# Patient Record
Sex: Female | Born: 1990
Health system: Southern US, Community
[De-identification: ages and names within clinical notes are randomized; demographics above are authoritative.]

## PROBLEM LIST (undated history)

## (undated) ENCOUNTER — Inpatient Hospital Stay (HOSPITAL_COMMUNITY): Payer: Self-pay

## (undated) DIAGNOSIS — D219 Benign neoplasm of connective and other soft tissue, unspecified: Secondary | ICD-10-CM

## (undated) DIAGNOSIS — O039 Complete or unspecified spontaneous abortion without complication: Secondary | ICD-10-CM

## (undated) DIAGNOSIS — T7840XA Allergy, unspecified, initial encounter: Secondary | ICD-10-CM

## (undated) DIAGNOSIS — F419 Anxiety disorder, unspecified: Secondary | ICD-10-CM

## (undated) DIAGNOSIS — Z91018 Allergy to other foods: Secondary | ICD-10-CM

## (undated) DIAGNOSIS — M549 Dorsalgia, unspecified: Secondary | ICD-10-CM

## (undated) DIAGNOSIS — IMO0001 Reserved for inherently not codable concepts without codable children: Secondary | ICD-10-CM

## (undated) DIAGNOSIS — F32A Depression, unspecified: Secondary | ICD-10-CM

## (undated) DIAGNOSIS — R0602 Shortness of breath: Secondary | ICD-10-CM

## (undated) HISTORY — DX: Shortness of breath: R06.02

## (undated) HISTORY — DX: Depression, unspecified: F32.A

## (undated) HISTORY — DX: Dorsalgia, unspecified: M54.9

## (undated) HISTORY — DX: Anxiety disorder, unspecified: F41.9

## (undated) HISTORY — DX: Allergy, unspecified, initial encounter: T78.40XA

## (undated) HISTORY — DX: Allergy to other foods: Z91.018

## (undated) HISTORY — PX: WISDOM TOOTH EXTRACTION: SHX21

---
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
## 2012-03-13 ENCOUNTER — Ambulatory Visit: Payer: 59

## 2012-03-13 ENCOUNTER — Ambulatory Visit: Payer: BC Managed Care – PPO | Admitting: Family Medicine

## 2012-03-13 VITALS — BP 109/73 | HR 91 | Temp 98.3°F | Resp 18 | Ht 64.0 in | Wt 168.0 lb

## 2012-03-13 DIAGNOSIS — L03119 Cellulitis of unspecified part of limb: Secondary | ICD-10-CM

## 2012-03-13 DIAGNOSIS — M25549 Pain in joints of unspecified hand: Secondary | ICD-10-CM

## 2012-03-13 DIAGNOSIS — M25542 Pain in joints of left hand: Secondary | ICD-10-CM

## 2012-03-13 DIAGNOSIS — L02519 Cutaneous abscess of unspecified hand: Secondary | ICD-10-CM

## 2012-03-13 LAB — POCT CBC
Granulocyte percent: 63.1 %G (ref 37–80)
HCT, POC: 46.6 % (ref 37.7–47.9)
Hemoglobin: 14.4 g/dL (ref 12.2–16.2)
Lymph, poc: 4.2 — AB (ref 0.6–3.4)
MCH, POC: 28 pg (ref 27–31.2)
MCHC: 30.9 g/dL — AB (ref 31.8–35.4)
MCV: 90.6 fL (ref 80–97)
MID (cbc): 0.8 (ref 0–0.9)
MPV: 9.3 fL (ref 0–99.8)
POC Granulocyte: 8.6 — AB (ref 2–6.9)
POC LYMPH PERCENT: 31 %L (ref 10–50)
POC MID %: 5.9 %M (ref 0–12)
Platelet Count, POC: 329 10*3/uL (ref 142–424)
RBC: 5.14 M/uL (ref 4.04–5.48)
RDW, POC: 13.4 %
WBC: 13.7 10*3/uL — AB (ref 4.6–10.2)

## 2012-03-13 NOTE — Progress Notes (Signed)
Subjective:    Patient ID: Deborah Gibbs, female    DOB: 1990/08/04, 21 y.o.   MRN: [id number]  HPI   Deborah Gibbs is a 21 yr old female who was stung by a yellow jacket 4 days ago.  Saw the bee as it stung her left hand.  Removed any remaining stinger.  Immediately had redness, swelling, and itching.  Began taking Claritin with little relief.  States the redness is spreading.  Now having pain moving her thumb.  Pain in the wrist now as well.  No fever or chills.  Allergies to PCN and benadryl.  States tetanus is up to date.     Review of Systems  Constitutional: Negative for fever and chills.  HENT: Negative.   Respiratory: Negative.   Cardiovascular: Negative.   Gastrointestinal: Negative.   Musculoskeletal: Positive for arthralgias (right thumb, wrist).  Skin: Positive for color change.  Neurological: Negative.        Objective:   Physical Exam  Vitals reviewed. Constitutional: She is oriented to person, place, and time. She appears well-developed and well-nourished. No distress.  HENT:  Head: Normocephalic and atraumatic.  Musculoskeletal:       Right wrist: Normal.       Left hand: She exhibits decreased range of motion (pain on ROM left thumb) and tenderness (exquisitely tender over the thumb and flexor aspect of the wrist and forearm).       Hands: Lymphadenopathy:       Left axillary: No pectoral and no lateral adenopathy present.      Left: No epitrochlear adenopathy present.  Neurological: She is alert and oriented to person, place, and time.  Skin: Skin is warm and dry.  Psychiatric: She has a normal mood and affect. Her behavior is normal.     Filed Vitals:   03/13/12 0940  BP: 109/73  Pulse: 91  Temp: 98.3 F (36.8 C)  Resp: 18     Results for orders placed in visit on 03/13/12  POCT CBC      Component Value Range   WBC 13.7 (*) 4.6 - 10.2 K/uL   Lymph, poc 4.2 (*) 0.6 - 3.4   POC LYMPH PERCENT 31.0  10 - 50 %L   MID (cbc) 0.8  0 - 0.9   POC MID % 5.9  0  - 12 %M   POC Granulocyte 8.6 (*) 2 - 6.9   Granulocyte percent 63.1  37 - 80 %G   RBC 5.14  4.04 - 5.48 M/uL   Hemoglobin 14.4  12.2 - 16.2 g/dL   HCT, POC 78.2  95.6 - 47.9 %   MCV 90.6  80 - 97 fL   MCH, POC 28.0  27 - 31.2 pg   MCHC 30.9 (*) 31.8 - 35.4 g/dL   RDW, POC 21.3     Platelet Count, POC 329  142 - 424 K/uL   MPV 9.3  0 - 99.8 fL     [facility] reading (PRIMARY) by  Dr. Katrinka Blazing - soft tissue swelling, otherwise normal      Assessment & Plan:   1. Cellulitis of hand  POCT CBC    Deborah Gibbs is a 21 yr old female s/p bee sting 4 days ago now with cellulitis of the thumb and wrist.  She is exquisitely tender on exam, out of proportion to physical exam findings.  Concern for involvement of tendon or deep tissue.  White count is 13.7.  Due to pt allergies to PCN  and benadryl, we are hesitant to give rocephin in the office.   Pt has been NPO today.  X-ray per hand surgery request is normal aside from soft tissue swelling.  Spoke with PA at [facility], pt will be seen there this afternoon for further evaluation.

## 2012-03-13 NOTE — Progress Notes (Signed)
Below history, physical exam reviewed in detail.  Xrays reviewed.  Patient examined by myself and agree with below physical findings.  Agree with assessment and plan; recommend hand surgery management due to significant pain with range of motion of wrist and forearm in addition to hand pain.  KMS

## 2012-03-14 ENCOUNTER — Telehealth: Payer: Self-pay | Admitting: Radiology

## 2012-03-14 NOTE — Telephone Encounter (Signed)
Patient was seen at [facility] yesterday and is being seen today also. FYI only

## 2012-03-17 NOTE — Progress Notes (Signed)
Reviewed and agree.

## 2012-08-13 ENCOUNTER — Emergency Department (HOSPITAL_COMMUNITY)
Admission: EM | Admit: 2012-08-13 | Discharge: 2012-08-13 | Disposition: A | Discharge status: Home / Self Care | Payer: 59 | Attending: Emergency Medicine | Admitting: Emergency Medicine

## 2012-08-13 ENCOUNTER — Encounter (HOSPITAL_COMMUNITY): Payer: Self-pay | Admitting: Emergency Medicine

## 2012-08-13 ENCOUNTER — Emergency Department (HOSPITAL_COMMUNITY): Payer: 59

## 2012-08-13 DIAGNOSIS — Y9389 Activity, other specified: Secondary | ICD-10-CM | POA: Insufficient documentation

## 2012-08-13 DIAGNOSIS — S99919A Unspecified injury of unspecified ankle, initial encounter: Secondary | ICD-10-CM | POA: Insufficient documentation

## 2012-08-13 DIAGNOSIS — M25561 Pain in right knee: Secondary | ICD-10-CM

## 2012-08-13 DIAGNOSIS — Z880 Allergy status to penicillin: Secondary | ICD-10-CM | POA: Insufficient documentation

## 2012-08-13 DIAGNOSIS — S8990XA Unspecified injury of unspecified lower leg, initial encounter: Secondary | ICD-10-CM | POA: Insufficient documentation

## 2012-08-13 DIAGNOSIS — Y92410 Unspecified street and highway as the place of occurrence of the external cause: Secondary | ICD-10-CM | POA: Insufficient documentation

## 2012-08-13 DIAGNOSIS — R109 Unspecified abdominal pain: Secondary | ICD-10-CM | POA: Insufficient documentation

## 2012-08-13 LAB — POCT I-STAT, CHEM 8
BUN: 4 mg/dL — ABNORMAL LOW (ref 6–23)
Calcium, Ion: 1.25 mmol/L — ABNORMAL HIGH (ref 1.12–1.23)
Chloride: 107 mEq/L (ref 96–112)
Creatinine, Ser: 0.7 mg/dL (ref 0.50–1.10)
Glucose, Bld: 85 mg/dL (ref 70–99)
HCT: 40 % (ref 36.0–46.0)
Hemoglobin: 13.6 g/dL (ref 12.0–15.0)
Potassium: 3.4 mEq/L — ABNORMAL LOW (ref 3.5–5.1)
Sodium: 142 mEq/L (ref 135–145)
TCO2: 23 mmol/L (ref 0–100)

## 2012-08-13 MED ORDER — HYDROMORPHONE HCL PF 1 MG/ML IJ SOLN
1.0000 mg | Freq: Once | INTRAMUSCULAR | Status: AC
  Administered 2012-08-13: 1 mg via INTRAVENOUS
  Filled 2012-08-13: qty 1

## 2012-08-13 MED ORDER — IOHEXOL 300 MG/ML  SOLN
100.0000 mL | Freq: Once | INTRAMUSCULAR | Status: AC | PRN
  Administered 2012-08-13: 100 mL via INTRAVENOUS

## 2012-08-13 MED ORDER — HYDROCODONE-ACETAMINOPHEN 5-325 MG PO TABS: 2.0000 | ORAL_TABLET | Freq: Four times a day (QID) | ORAL | Status: DC | PRN
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
## 2012-08-13 NOTE — ED Provider Notes (Signed)
  Medical screening examination/treatment/procedure(s) were conducted as a shared visit with non-physician practitioner(s) and myself.  I personally evaluated the patient during the encounter  Please see my separate respective documentation pertaining to this patient encounter   Vida Roller, MD 08/13/12 [phone]
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
## 2013-07-03 ENCOUNTER — Emergency Department (HOSPITAL_COMMUNITY)
Admission: EM | Admit: 2013-07-03 | Discharge: 2013-07-03 | Disposition: A | Discharge status: Home / Self Care | Payer: 59 | Attending: Emergency Medicine | Admitting: Emergency Medicine

## 2013-07-03 ENCOUNTER — Encounter (HOSPITAL_COMMUNITY): Payer: Self-pay | Admitting: Emergency Medicine

## 2013-07-03 ENCOUNTER — Emergency Department (HOSPITAL_COMMUNITY): Payer: 59

## 2013-07-03 DIAGNOSIS — R102 Pelvic and perineal pain: Secondary | ICD-10-CM

## 2013-07-03 DIAGNOSIS — N946 Dysmenorrhea, unspecified: Secondary | ICD-10-CM | POA: Insufficient documentation

## 2013-07-03 DIAGNOSIS — Z3202 Encounter for pregnancy test, result negative: Secondary | ICD-10-CM | POA: Insufficient documentation

## 2013-07-03 DIAGNOSIS — Z880 Allergy status to penicillin: Secondary | ICD-10-CM | POA: Insufficient documentation

## 2013-07-03 DIAGNOSIS — N939 Abnormal uterine and vaginal bleeding, unspecified: Secondary | ICD-10-CM

## 2013-07-03 HISTORY — DX: Reserved for inherently not codable concepts without codable children: IMO0001

## 2013-07-03 HISTORY — DX: Complete or unspecified spontaneous abortion without complication: O03.9

## 2013-07-03 LAB — CBC
HCT: 35.9 % — ABNORMAL LOW (ref 36.0–46.0)
Hemoglobin: 12.6 g/dL (ref 12.0–15.0)
MCH: 29.1 pg (ref 26.0–34.0)
MCHC: 35.1 g/dL (ref 30.0–36.0)
MCV: 82.9 fL (ref 78.0–100.0)
Platelets: 268 10*3/uL (ref 150–400)
RBC: 4.33 MIL/uL (ref 3.87–5.11)
RDW: 12.5 % (ref 11.5–15.5)
WBC: 9.3 10*3/uL (ref 4.0–10.5)

## 2013-07-03 LAB — URINALYSIS, ROUTINE W REFLEX MICROSCOPIC
Bilirubin Urine: NEGATIVE
Glucose, UA: NEGATIVE mg/dL
Ketones, ur: NEGATIVE mg/dL
Nitrite: NEGATIVE
Protein, ur: 30 mg/dL — AB
Specific Gravity, Urine: 1.015 (ref 1.005–1.030)
Urobilinogen, UA: 0.2 mg/dL (ref 0.0–1.0)
pH: 6.5 (ref 5.0–8.0)

## 2013-07-03 LAB — URINE MICROSCOPIC-ADD ON

## 2013-07-03 LAB — COMPREHENSIVE METABOLIC PANEL
ALT: 7 U/L (ref 0–35)
AST: 16 U/L (ref 0–37)
Albumin: 3.9 g/dL (ref 3.5–5.2)
Alkaline Phosphatase: 44 U/L (ref 39–117)
BUN: 7 mg/dL (ref 6–23)
CO2: 20 mEq/L (ref 19–32)
Calcium: 9.3 mg/dL (ref 8.4–10.5)
Chloride: 107 mEq/L (ref 96–112)
Creatinine, Ser: 0.61 mg/dL (ref 0.50–1.10)
GFR calc Af Amer: >90 mL/min (ref >90)
GFR calc non Af Amer: >90 mL/min (ref >90)
Glucose, Bld: 94 mg/dL (ref 70–99)
Potassium: 3.5 mEq/L — ABNORMAL LOW (ref 3.7–5.3)
Sodium: 143 mEq/L (ref 137–147)
Total Bilirubin: 0.4 mg/dL (ref 0.3–1.2)
Total Protein: 6.9 g/dL (ref 6.0–8.3)

## 2013-07-03 LAB — ABO/RH: ABO/RH(D): O NEG

## 2013-07-03 LAB — PREGNANCY, URINE: Preg Test, Ur: NEGATIVE

## 2013-07-03 LAB — HCG, QUANTITATIVE, PREGNANCY: hCG, Beta Chain, Quant, S: <1 m[IU]/mL (ref <5)

## 2013-07-03 MED ORDER — TRAMADOL HCL 50 MG PO TABS: 50.0000 mg | ORAL_TABLET | Freq: Four times a day (QID) | ORAL | Status: DC | PRN

## 2013-07-03 MED ORDER — HYDROMORPHONE HCL PF 1 MG/ML IJ SOLN
1.0000 mg | Freq: Once | INTRAMUSCULAR | Status: AC
  Administered 2013-07-03: 1 mg via INTRAVENOUS
  Filled 2013-07-03: qty 1

## 2013-07-03 MED ORDER — ONDANSETRON HCL 4 MG/2ML IJ SOLN
4.0000 mg | Freq: Once | INTRAMUSCULAR | Status: AC
  Administered 2013-07-03: 4 mg via INTRAVENOUS
  Filled 2013-07-03: qty 2

## 2013-07-03 MED ORDER — SODIUM CHLORIDE 0.9 % IV BOLUS (SEPSIS)
1000.0000 mL | Freq: Once | INTRAVENOUS | Status: AC
  Administered 2013-07-03: 1000 mL via INTRAVENOUS

## 2013-07-03 NOTE — ED Provider Notes (Signed)
CSN: [id number]     Arrival date & time 07/03/13  0865 History   First MD Initiated Contact with Patient 07/03/13 0830     Chief Complaint  Patient presents with  . Abdominal Pain     (Consider location/radiation/quality/duration/timing/severity/associated sxs/prior Treatment) Patient is a 23 y.o. female presenting with abdominal pain. The history is provided by the patient.  Abdominal Pain Associated symptoms: vaginal bleeding   Associated symptoms: no chest pain, no chills, no diarrhea, no dysuria, no fever, no shortness of breath, no sore throat, no vaginal discharge and no vomiting   pt c/o vaginal bleeding and bilateral, lower abdominal/pelvic cramping for the past day. Symptoms gradual onset. Yesterday was just mild spotting and cramping. This morning pain and bleeding worse. States had used 2 tampons so far today. Also states pain more intense. Pain dull, cramping, non radiating. No worse on one side or the other. Pt states at first states thought was just menstrual cramping, but worse. Pt initially states took pregnancy test at home, pt is very equivocal about when she took it and results - states possibly a month ago, but then states isnt sure if was positive or not.  Previously g1ab1.  States has been on 'shots' for birth control and states unsure when last had, also does not know when last period was. Denies faintness or dizziness. No fever or chills. No dysuria or urgency. No prior abd surgery. No distension. Having normal stools. No nv.     Past Medical History  Diagnosis Date  . Allergy   . Abortion    History reviewed. No pertinent past surgical history. Family History  Problem Relation Age of Onset  . Hypertension Father    History  Substance Use Topics  . Smoking status: Never Smoker   . Smokeless tobacco: Not on file  . Alcohol Use: No   OB History   Grav Para Term Preterm Abortions TAB SAB Ect Mult Living                 Review of Systems  Constitutional:  Negative for fever and chills.  HENT: Negative for sore throat.   Eyes: Negative for pain.  Respiratory: Negative for shortness of breath.   Cardiovascular: Negative for chest pain.  Gastrointestinal: Positive for abdominal pain. Negative for vomiting and diarrhea.  Endocrine: Negative for polyuria.  Genitourinary: Positive for vaginal bleeding. Negative for dysuria, flank pain and vaginal discharge.  Musculoskeletal: Negative for back pain and neck pain.  Skin: Negative for rash.  Neurological: Negative for headaches.  Hematological: Does not bruise/bleed easily.  Psychiatric/Behavioral: Negative for confusion.      Allergies  Diphenhydramine hcl and Penicillins  Home Medications   Current Outpatient Rx  Name  Route  Sig  Dispense  Refill  . HYDROcodone-acetaminophen (NORCO/VICODIN) 5-325 MG per tablet   Oral   Take 2 tablets by mouth every 6 (six) hours as needed for pain.   15 tablet   0    BP 123/78  Temp(Src) 98.5 F (36.9 C) (Oral)  Resp 20  Ht 5\' 3"  (1.6 m)  Wt 160 lb (72.576 kg)  BMI 28.35 kg/m2  SpO2 100% Physical Exam  Nursing note and vitals reviewed. Constitutional: She is oriented to person, place, and time. She appears well-developed and well-nourished. No distress.  HENT:  Mouth/Throat: Oropharynx is clear and moist.  Eyes: Conjunctivae are normal. No scleral icterus.  Neck: Neck supple. No tracheal deviation present.  Cardiovascular: Normal rate, regular rhythm, normal heart sounds  and intact distal pulses.   Pulmonary/Chest: Effort normal and breath sounds normal. No respiratory distress.  Abdominal: Soft. Normal appearance and bowel sounds are normal. She exhibits no distension and no mass. There is tenderness. There is no rebound and no guarding.  Lower abd/pelvic tenderness, no rebound or guarding. No hernia.   Genitourinary:  No cva tenderness.   Musculoskeletal: She exhibits no edema and no tenderness.  Neurological: She is alert and oriented  to person, place, and time.  Skin: Skin is warm and dry. No rash noted. She is not diaphoretic.  Psychiatric: She has a normal mood and affect.    ED Course  Procedures (including critical care time)   Results for orders placed during the hospital encounter of 07/03/13  PREGNANCY, URINE      Result Value Ref Range   Preg Test, Ur NEGATIVE  NEGATIVE  CBC      Result Value Ref Range   WBC 9.3  4.0 - 10.5 K/uL   RBC 4.33  3.87 - 5.11 MIL/uL   Hemoglobin 12.6  12.0 - 15.0 g/dL   HCT 35.9 (*) 36.0 - 46.0 %   MCV 82.9  78.0 - 100.0 fL   MCH 29.1  26.0 - 34.0 pg   MCHC 35.1  30.0 - 36.0 g/dL   RDW 12.5  11.5 - 15.5 %   Platelets 268  150 - 400 K/uL  COMPREHENSIVE METABOLIC PANEL      Result Value Ref Range   Sodium 143  137 - 147 mEq/L   Potassium 3.5 (*) 3.7 - 5.3 mEq/L   Chloride 107  96 - 112 mEq/L   CO2 20  19 - 32 mEq/L   Glucose, Bld 94  70 - 99 mg/dL   BUN 7  6 - 23 mg/dL   Creatinine, Ser 0.61  0.50 - 1.10 mg/dL   Calcium 9.3  8.4 - 10.5 mg/dL   Total Protein 6.9  6.0 - 8.3 g/dL   Albumin 3.9  3.5 - 5.2 g/dL   AST 16  0 - 37 U/L   ALT 7  0 - 35 U/L   Alkaline Phosphatase 44  39 - 117 U/L   Total Bilirubin 0.4  0.3 - 1.2 mg/dL   GFR calc non Af Amer >90  >90 mL/min   GFR calc Af Amer >90  >90 mL/min  HCG, QUANTITATIVE, PREGNANCY      Result Value Ref Range   hCG, Beta Chain, Quant, S <1  <5 mIU/mL  URINALYSIS, ROUTINE W REFLEX MICROSCOPIC      Result Value Ref Range   Color, Urine RED (*) YELLOW   APPearance CLOUDY (*) CLEAR   Specific Gravity, Urine 1.015  1.005 - 1.030   pH 6.5  5.0 - 8.0   Glucose, UA NEGATIVE  NEGATIVE mg/dL   Hgb urine dipstick LARGE (*) NEGATIVE   Bilirubin Urine NEGATIVE  NEGATIVE   Ketones, ur NEGATIVE  NEGATIVE mg/dL   Protein, ur 30 (*) NEGATIVE mg/dL   Urobilinogen, UA 0.2  0.0 - 1.0 mg/dL   Nitrite NEGATIVE  NEGATIVE   Leukocytes, UA TRACE (*) NEGATIVE  URINE MICROSCOPIC-ADD ON      Result Value Ref Range   Squamous Epithelial  / LPF FEW (*) RARE   WBC, UA 0-2  <3 WBC/hpf   RBC / HPF TOO NUMEROUS TO COUNT  <3 RBC/hpf   Bacteria, UA FEW (*) RARE  ABO/RH      Result Value Ref Range  ABO/RH(D) O NEG     US Transvaginal Non-ob  07/03/2013   CLINICAL DATA:  Pelvic pain  EXAM: TRANSABDOMINAL AND TRANSVAGINAL ULTRASOUND OF PELVIS  DOPPLER ULTRASOUND OF OVARIES  TECHNIQUE: Both transabdominal and transvaginal ultrasound examinations of the pelvis were performed. Transabdominal technique was performed for global imaging of the pelvis including uterus, ovaries, adnexal regions, and pelvic cul-de-sac.  It was necessary to proceed with endovaginal exam following the transabdominal exam to visualize the ovaries and endometrium. Color and duplex Doppler ultrasound was utilized to evaluate blood flow to the ovaries.  COMPARISON:  None.  FINDINGS: Uterus  Measurements: 5.5 x 4 x 4.8 cm. No fibroids or other mass visualized.  Endometrium  Thickness: 5.7 mm.  No focal abnormality visualized.  Right ovary  Measurements: 3.3 x 2.4 x 2.2 cm. There is a small right ovarian follicle. Normal appearance/no adnexal mass.  Left ovary  Measurements: 3.3 x 1 x 0.9 cm. Normal appearance/no adnexal mass.  Pulsed Doppler evaluation of both ovaries demonstrates normal low-resistance arterial and venous waveforms.  Other findings  There is a trace amount of pelvic free fluid likely physiologic.  IMPRESSION: 1. Normal pelvic ultrasound. 2. No ovarian torsion.   Electronically Signed   By: Kathreen Devoid   On: 07/03/2013 10:54   US Pelvis Complete  07/03/2013   CLINICAL DATA:  Pelvic pain  EXAM: TRANSABDOMINAL AND TRANSVAGINAL ULTRASOUND OF PELVIS  DOPPLER ULTRASOUND OF OVARIES  TECHNIQUE: Both transabdominal and transvaginal ultrasound examinations of the pelvis were performed. Transabdominal technique was performed for global imaging of the pelvis including uterus, ovaries, adnexal regions, and pelvic cul-de-sac.  It was necessary to proceed with endovaginal  exam following the transabdominal exam to visualize the ovaries and endometrium. Color and duplex Doppler ultrasound was utilized to evaluate blood flow to the ovaries.  COMPARISON:  None.  FINDINGS: Uterus  Measurements: 5.5 x 4 x 4.8 cm. No fibroids or other mass visualized.  Endometrium  Thickness: 5.7 mm.  No focal abnormality visualized.  Right ovary  Measurements: 3.3 x 2.4 x 2.2 cm. There is a small right ovarian follicle. Normal appearance/no adnexal mass.  Left ovary  Measurements: 3.3 x 1 x 0.9 cm. Normal appearance/no adnexal mass.  Pulsed Doppler evaluation of both ovaries demonstrates normal low-resistance arterial and venous waveforms.  Other findings  There is a trace amount of pelvic free fluid likely physiologic.  IMPRESSION: 1. Normal pelvic ultrasound. 2. No ovarian torsion.   Electronically Signed   By: Kathreen Devoid   On: 07/03/2013 10:54   [place] Art/ven Flow Abd Pelv Doppler  07/03/2013   CLINICAL DATA:  Pelvic pain  EXAM: TRANSABDOMINAL AND TRANSVAGINAL ULTRASOUND OF PELVIS  DOPPLER ULTRASOUND OF OVARIES  TECHNIQUE: Both transabdominal and transvaginal ultrasound examinations of the pelvis were performed. Transabdominal technique was performed for global imaging of the pelvis including uterus, ovaries, adnexal regions, and pelvic cul-de-sac.  It was necessary to proceed with endovaginal exam following the transabdominal exam to visualize the ovaries and endometrium. Color and duplex Doppler ultrasound was utilized to evaluate blood flow to the ovaries.  COMPARISON:  None.  FINDINGS: Uterus  Measurements: 5.5 x 4 x 4.8 cm. No fibroids or other mass visualized.  Endometrium  Thickness: 5.7 mm.  No focal abnormality visualized.  Right ovary  Measurements: 3.3 x 2.4 x 2.2 cm. There is a small right ovarian follicle. Normal appearance/no adnexal mass.  Left ovary  Measurements: 3.3 x 1 x 0.9 cm. Normal appearance/no adnexal mass.  Pulsed Doppler  evaluation of both ovaries demonstrates normal  low-resistance arterial and venous waveforms.  Other findings  There is a trace amount of pelvic free fluid likely physiologic.  IMPRESSION: 1. Normal pelvic ultrasound. 2. No ovarian torsion.   Electronically Signed   By: Kathreen Devoid   On: 07/03/2013 10:54      MDM  Iv ns. Labs. Dilaudid 1 mg iv. zofran iv.  Reviewed nursing notes and prior charts for additional history.   preg test neg.   U/s neg.  On pelvic exam, scant amt dark blood in vagina, to brisk or active bleeding. Cervix closed. No focal adnexal mass or tenderness. No cmt.   Recheck pt, comfortable. Drinking fluids. abd soft nt.  Pt appears stable for d/c.      Mirna Mires, MD 07/03/13 [phone]

## 2013-07-03 NOTE — ED Notes (Signed)
MD at bedside for pelvic exam with NT.

## 2013-07-03 NOTE — ED Notes (Signed)
Pt states she started having terrible cramps last night. This morning pt thought her period was coming on- started passing large "clumps and clots" and having cramps. Pt feels lightheaded and dizzy.

## 2013-07-03 NOTE — ED Notes (Signed)
Patient transported to Ultrasound 

## 2013-07-03 NOTE — Discharge Instructions (Signed)
Rest. Drink plenty of fluids. Take motrin or aleve as need for pain. You may also take ultram as need for pain - no driving for the next 6 hours of if taking ultram. Follow up with primary care doctor in the next few days if symptoms fail to improve/resolve. Return to ER right away if worse, new symptoms, fevers, worsening pain, persistent vomiting, other concern.      Abdominal Pain, Women Abdominal (stomach, pelvic, or belly) pain can be caused by many things. It is important to tell your doctor:  The location of the pain.  Does it come and go or is it present all the time?  Are there things that start the pain (eating certain foods, exercise)?  Are there other symptoms associated with the pain (fever, nausea, vomiting, diarrhea)? All of this is helpful to know when trying to find the cause of the pain. CAUSES   Stomach: virus or bacteria infection, or ulcer.  Intestine: appendicitis (inflamed appendix), regional ileitis (Crohn's disease), ulcerative colitis (inflamed colon), irritable bowel syndrome, diverticulitis (inflamed diverticulum of the colon), or cancer of the stomach or intestine.  Gallbladder disease or stones in the gallbladder.  Kidney disease, kidney stones, or infection.  Pancreas infection or cancer.  Fibromyalgia (pain disorder).  Diseases of the female organs:  Uterus: fibroid (non-cancerous) tumors or infection.  Fallopian tubes: infection or tubal pregnancy.  Ovary: cysts or tumors.  Pelvic adhesions (scar tissue).  Endometriosis (uterus lining tissue growing in the pelvis and on the pelvic organs).  Pelvic congestion syndrome (female organs filling up with blood just before the menstrual period).  Pain with the menstrual period.  Pain with ovulation (producing an egg).  Pain with an IUD (intrauterine device, birth control) in the uterus.  Cancer of the female organs.  Functional pain (pain not caused by a disease, may improve without  treatment).  Psychological pain.  Depression. DIAGNOSIS  Your doctor will decide the seriousness of your pain by doing an examination.  Blood tests.  X-rays.  Ultrasound.  CT scan (computed tomography, special type of X-ray).  MRI (magnetic resonance imaging).  Cultures, for infection.  Barium enema (dye inserted in the large intestine, to better view it with X-rays).  Colonoscopy (looking in intestine with a lighted tube).  Laparoscopy (minor surgery, looking in abdomen with a lighted tube).  Major abdominal exploratory surgery (looking in abdomen with a large incision). TREATMENT  The treatment will depend on the cause of the pain.   Many cases can be observed and treated at home.  Over-the-counter medicines recommended by your caregiver.  Prescription medicine.  Antibiotics, for infection.  Birth control pills, for painful periods or for ovulation pain.  Hormone treatment, for endometriosis.  Nerve blocking injections.  Physical therapy.  Antidepressants.  Counseling with a psychologist or psychiatrist.  Minor or major surgery. HOME CARE INSTRUCTIONS   Do not take laxatives, unless directed by your caregiver.  Take over-the-counter pain medicine only if ordered by your caregiver. Do not take aspirin because it can cause an upset stomach or bleeding.  Try a clear liquid diet (broth or water) as ordered by your caregiver. Slowly move to a bland diet, as tolerated, if the pain is related to the stomach or intestine.  Have a thermometer and take your temperature several times a day, and record it.  Bed rest and sleep, if it helps the pain.  Avoid sexual intercourse, if it causes pain.  Avoid stressful situations.  Keep your follow-up appointments and tests,  as your caregiver orders.  If the pain does not go away with medicine or surgery, you may try:  Acupuncture.  Relaxation exercises (yoga, meditation).  Group therapy.  Counseling. SEEK  MEDICAL CARE IF:   You notice certain foods cause stomach pain.  Your home care treatment is not helping your pain.  You need stronger pain medicine.  You want your IUD removed.  You feel faint or lightheaded.  You develop nausea and vomiting.  You develop a rash.  You are having side effects or an allergy to your medicine. SEEK IMMEDIATE MEDICAL CARE IF:   Your pain does not go away or gets worse.  You have a fever.  Your pain is felt only in portions of the abdomen. The right side could possibly be appendicitis. The left lower portion of the abdomen could be colitis or diverticulitis.  You are passing blood in your stools (bright red or black tarry stools, with or without vomiting).  You have blood in your urine.  You develop chills, with or without a fever.  You pass out. MAKE SURE YOU:   Understand these instructions.  Will watch your condition.  Will get help right away if you are not doing well or get worse. Document Released: 01/21/2007 Document Revised: 06/18/2011 Document Reviewed: 02/10/2009 [facility] Patient Information 2014 [place], [place].    Pelvic Pain, Female Female pelvic pain can be caused by many different things and start from a variety of places. Pelvic pain refers to pain that is located in the lower half of the abdomen and between your hips. The pain may occur over a short period of time (acute) or may be reoccurring (chronic). The cause of pelvic pain may be related to disorders affecting the female reproductive organs (gynecologic), but it may also be related to the bladder, kidney stones, an intestinal complication, or muscle or skeletal problems. Getting help right away for pelvic pain is important, especially if there has been severe, sharp, or a sudden onset of unusual pain. It is also important to get help right away because some types of pelvic pain can be life threatening.  CAUSES  Below are only some of the causes of pelvic pain. The  causes of pelvic pain can be in one of several categories.   Gynecologic.  Pelvic inflammatory disease.  Sexually transmitted infection.  Ovarian cyst or a twisted ovarian ligament (ovarian torsion).  Uterine lining that grows outside the uterus (endometriosis).  Fibroids, cysts, or tumors.  Ovulation.  Pregnancy.  Pregnancy that occurs outside the uterus (ectopic pregnancy).  Miscarriage.  Labor.  Abruption of the placenta or ruptured uterus.  Infection.  Uterine infection (endometritis).  Bladder infection.  Diverticulitis.  Miscarriage related to a uterine infection (septic abortion).  Bladder.  Inflammation of the bladder (cystitis).  Kidney stone(s).  Gastrointenstinal.  Constipation.  Diverticulitis.  Neurologic.  Trauma.  Feeling pelvic pain because of mental or emotional causes (psychosomatic).  Cancers of the bowel or pelvis. EVALUATION  Your caregiver will want to take a careful history of your concerns. This includes recent changes in your health, a careful gynecologic history of your periods (menses), and a sexual history. Obtaining your family history and medical history is also important. Your caregiver may suggest a pelvic exam. A pelvic exam will help identify the location and severity of the pain. It also helps in the evaluation of which organ system may be involved. In order to identify the cause of the pelvic pain and be properly treated, your caregiver  may order tests. These tests may include:   A pregnancy test.  Pelvic ultrasonography.  An X-ray exam of the abdomen.  A urinalysis or evaluation of vaginal discharge.  Blood tests. HOME CARE INSTRUCTIONS   Only take over-the-counter or prescription medicines for pain, discomfort, or fever as directed by your caregiver.   Rest as directed by your caregiver.   Eat a balanced diet.   Drink enough fluids to make your urine clear or pale yellow, or as directed.   Avoid  sexual intercourse if it causes pain.   Apply warm or cold compresses to the lower abdomen depending on which one helps the pain.   Avoid stressful situations.   Keep a journal of your pelvic pain. Write down when it started, where the pain is located, and if there are things that seem to be associated with the pain, such as food or your menstrual cycle.  Follow up with your caregiver as directed.  SEEK MEDICAL CARE IF:  Your medicine does not help your pain.  You have abnormal vaginal discharge. SEEK IMMEDIATE MEDICAL CARE IF:   You have heavy bleeding from the vagina.   Your pelvic pain increases.   You feel lightheaded or faint.   You have chills.   You have pain with urination or blood in your urine.   You have uncontrolled diarrhea or vomiting.   You have a fever or persistent symptoms for more than 3 days.  You have a fever and your symptoms suddenly get worse.   You are being physically or sexually abused.  MAKE SURE YOU:  Understand these instructions.  Will watch your condition.  Will get help if you are not doing well or get worse. Document Released: 02/21/2004 Document Revised: 09/25/2011 Document Reviewed: 07/16/2011 [facility] Patient Information 2014 [place], [place].    Dysmenorrhea Menstrual cramps (dysmenorrhea) are caused by the muscles of the uterus tightening (contracting) during a menstrual period. For some women, this discomfort is merely bothersome. For others, dysmenorrhea can be severe enough to interfere with everyday activities for a few days each month. Primary dysmenorrhea is menstrual cramps that last a couple of days when you start having menstrual periods or soon after. This often begins after a teenager starts having her period. As a woman gets older or has a baby, the cramps will usually lessen or disappear. Secondary dysmenorrhea begins later in life, lasts longer, and the pain may be stronger than primary dysmenorrhea. The  pain may start before the period and last a few days after the period.  CAUSES  Dysmenorrhea is usually caused by an underlying problem, such as:  The tissue lining the uterus grows outside of the uterus in other areas of the body (endometriosis).  The endometrial tissue, which normally lines the uterus, is found in or grows into the muscular walls of the uterus (adenomyosis).  The pelvic blood vessels are engorged with blood just before the menstrual period (pelvic congestive syndrome).  Overgrowth of cells (polyps) in the lining of the uterus or cervix.  Falling down of the uterus (prolapse) because of loose or stretched ligaments.  Depression.  Bladder problems, infection, or inflammation.  Problems with the intestine, a tumor, or irritable bowel syndrome.  Cancer of the female organs or bladder.  A severely tipped uterus.  A very tight opening or closed cervix.  Noncancerous tumors of the uterus (fibroids).  Pelvic inflammatory disease (PID).  Pelvic scarring (adhesions) from a previous surgery.  Ovarian cyst.  An intrauterine device (IUD)  used for birth control. RISK FACTORS You may be at greater risk of dysmenorrhea if:  You are younger than age 54.  You started puberty early.  You have irregular or heavy bleeding.  You have never given birth.  You have a family history of this problem.  You are a smoker. SIGNS AND SYMPTOMS   Cramping or throbbing pain in your lower abdomen.  Headaches.  Lower back pain.  Nausea or vomiting.  Diarrhea.  Sweating or dizziness.  Loose stools. DIAGNOSIS  A diagnosis is based on your history, symptoms, physical exam, diagnostic tests, or procedures. Diagnostic tests or procedures may include:  Blood tests.  Ultrasonography.  An examination of the lining of the uterus (dilation and curettage, D&C).  An examination inside your abdomen or pelvis with a scope (laparoscopy).  X-rays.  CT scan.  MRI.  An  examination inside the bladder with a scope (cystoscopy).  An examination inside the intestine or stomach with a scope (colonoscopy, gastroscopy). TREATMENT  Treatment depends on the cause of the dysmenorrhea. Treatment may include:  Pain medicine prescribed by your health care provider.  Birth control pills or an IUD with progesterone hormone in it.  Hormone replacement therapy.  Nonsteroidal anti-inflammatory drugs (NSAIDs). These may help stop the production of prostaglandins.  Surgery to remove adhesions, endometriosis, ovarian cyst, or fibroids.  Removal of the uterus (hysterectomy).  Progesterone shots to stop the menstrual period.  Cutting the nerves on the sacrum that go to the female organs (presacral neurectomy).  Electric current to the sacral nerves (sacral nerve stimulation).  Antidepressant medicine.  Psychiatric therapy, counseling, or group therapy.  Exercise and physical therapy.  Meditation and yoga therapy.  Acupuncture. HOME CARE INSTRUCTIONS   Only take over-the-counter or prescription medicines as directed by your health care provider.  Place a heating pad or hot water bottle on your lower back or abdomen. Do not sleep with the heating pad.  Use aerobic exercises, walking, swimming, biking, and other exercises to help lessen the cramping.  Massage to the lower back or abdomen may help.  Stop smoking.  Avoid alcohol and caffeine. SEEK MEDICAL CARE IF:   Your pain does not get better with medicine.  You have pain with sexual intercourse.  Your pain increases and is not controlled with medicines.  You have abnormal vaginal bleeding with your period.  You develop nausea or vomiting with your period that is not controlled with medicine. SEEK IMMEDIATE MEDICAL CARE IF:  You pass out.  Document Released: 03/26/2005 Document Revised: 11/26/2012 Document Reviewed: 09/11/2012 [facility] Patient Information 2014 [place].

## 2013-07-04 ENCOUNTER — Encounter (HOSPITAL_COMMUNITY): Payer: Self-pay | Admitting: Emergency Medicine

## 2013-07-04 ENCOUNTER — Emergency Department (HOSPITAL_COMMUNITY)
Admission: EM | Admit: 2013-07-04 | Discharge: 2013-07-04 | Disposition: A | Discharge status: Home / Self Care | Payer: 59 | Attending: Emergency Medicine | Admitting: Emergency Medicine

## 2013-07-04 DIAGNOSIS — N946 Dysmenorrhea, unspecified: Secondary | ICD-10-CM

## 2013-07-04 DIAGNOSIS — R112 Nausea with vomiting, unspecified: Secondary | ICD-10-CM | POA: Insufficient documentation

## 2013-07-04 DIAGNOSIS — N939 Abnormal uterine and vaginal bleeding, unspecified: Secondary | ICD-10-CM

## 2013-07-04 DIAGNOSIS — Z880 Allergy status to penicillin: Secondary | ICD-10-CM | POA: Insufficient documentation

## 2013-07-04 DIAGNOSIS — M549 Dorsalgia, unspecified: Secondary | ICD-10-CM | POA: Insufficient documentation

## 2013-07-04 LAB — CBC
HCT: 36 % (ref 36.0–46.0)
Hemoglobin: 12.6 g/dL (ref 12.0–15.0)
MCH: 29.4 pg (ref 26.0–34.0)
MCHC: 35 g/dL (ref 30.0–36.0)
MCV: 84.1 fL (ref 78.0–100.0)
Platelets: 267 10*3/uL (ref 150–400)
RBC: 4.28 MIL/uL (ref 3.87–5.11)
RDW: 12.7 % (ref 11.5–15.5)
WBC: 9.5 10*3/uL (ref 4.0–10.5)

## 2013-07-04 LAB — BASIC METABOLIC PANEL
BUN: 6 mg/dL (ref 6–23)
CO2: 24 mEq/L (ref 19–32)
Calcium: 9.1 mg/dL (ref 8.4–10.5)
Chloride: 102 mEq/L (ref 96–112)
Creatinine, Ser: 0.65 mg/dL (ref 0.50–1.10)
GFR calc Af Amer: >90 mL/min (ref >90)
GFR calc non Af Amer: >90 mL/min (ref >90)
Glucose, Bld: 74 mg/dL (ref 70–99)
Potassium: 3.7 mEq/L (ref 3.7–5.3)
Sodium: 140 mEq/L (ref 137–147)

## 2013-07-04 MED ORDER — METOCLOPRAMIDE HCL 5 MG/ML IJ SOLN
10.0000 mg | Freq: Once | INTRAMUSCULAR | Status: AC
  Administered 2013-07-04: 10 mg via INTRAMUSCULAR
  Filled 2013-07-04: qty 2

## 2013-07-04 MED ORDER — KETOROLAC TROMETHAMINE 60 MG/2ML IM SOLN
60.0000 mg | Freq: Once | INTRAMUSCULAR | Status: AC
  Administered 2013-07-04: 60 mg via INTRAMUSCULAR
  Filled 2013-07-04: qty 2

## 2013-07-04 NOTE — Discharge Instructions (Signed)
Please call your doctor for a followup appointment within 24-48 hours. When you talk to your doctor please let them know that you were seen in the emergency department and have them acquire all of your records so that they can discuss the findings with you and formulate a treatment plan to fully care for your new and ongoing problems. Please call your physician first thing Monday morning to set up an appointment to be reassessed Please continue to take medications as prescribed-while on pain medications there is to be no drinking alcohol, driving, operating any heavy machinery if there is extra please disposer proper manner Please rest and stay hydrated Please continue to monitor symptoms closely and if symptoms are to worsen or change (fever greater than 101, chills, nausea, vomiting, chest pain, short of breath, difficulty breathing, fainting, worsening or changes to abdominal pain, worsening or changes to vaginal bleeding) please report back to the ED immediately   Dysmenorrhea Menstrual cramps (dysmenorrhea) are caused by the muscles of the uterus tightening (contracting) during a menstrual period. For some women, this discomfort is merely bothersome. For others, dysmenorrhea can be severe enough to interfere with everyday activities for a few days each month. Primary dysmenorrhea is menstrual cramps that last a couple of days when you start having menstrual periods or soon after. This often begins after a teenager starts having her period. As a woman gets older or has a baby, the cramps will usually lessen or disappear. Secondary dysmenorrhea begins later in life, lasts longer, and the pain may be stronger than primary dysmenorrhea. The pain may start before the period and last a few days after the period.  CAUSES  Dysmenorrhea is usually caused by an underlying problem, such as:  The tissue lining the uterus grows outside of the uterus in other areas of the body (endometriosis).  The endometrial  tissue, which normally lines the uterus, is found in or grows into the muscular walls of the uterus (adenomyosis).  The pelvic blood vessels are engorged with blood just before the menstrual period (pelvic congestive syndrome).  Overgrowth of cells (polyps) in the lining of the uterus or cervix.  Falling down of the uterus (prolapse) because of loose or stretched ligaments.  Depression.  Bladder problems, infection, or inflammation.  Problems with the intestine, a tumor, or irritable bowel syndrome.  Cancer of the female organs or bladder.  A severely tipped uterus.  A very tight opening or closed cervix.  Noncancerous tumors of the uterus (fibroids).  Pelvic inflammatory disease (PID).  Pelvic scarring (adhesions) from a previous surgery.  Ovarian cyst.  An intrauterine device (IUD) used for birth control. RISK FACTORS You may be at greater risk of dysmenorrhea if:  You are younger than age 61.  You started puberty early.  You have irregular or heavy bleeding.  You have never given birth.  You have a family history of this problem.  You are a smoker. SIGNS AND SYMPTOMS   Cramping or throbbing pain in your lower abdomen.  Headaches.  Lower back pain.  Nausea or vomiting.  Diarrhea.  Sweating or dizziness.  Loose stools. DIAGNOSIS  A diagnosis is based on your history, symptoms, physical exam, diagnostic tests, or procedures. Diagnostic tests or procedures may include:  Blood tests.  Ultrasonography.  An examination of the lining of the uterus (dilation and curettage, D&C).  An examination inside your abdomen or pelvis with a scope (laparoscopy).  X-rays.  CT scan.  MRI.  An examination inside the bladder with  a scope (cystoscopy).  An examination inside the intestine or stomach with a scope (colonoscopy, gastroscopy). TREATMENT  Treatment depends on the cause of the dysmenorrhea. Treatment may include:  Pain medicine prescribed by your  health care provider.  Birth control pills or an IUD with progesterone hormone in it.  Hormone replacement therapy.  Nonsteroidal anti-inflammatory drugs (NSAIDs). These may help stop the production of prostaglandins.  Surgery to remove adhesions, endometriosis, ovarian cyst, or fibroids.  Removal of the uterus (hysterectomy).  Progesterone shots to stop the menstrual period.  Cutting the nerves on the sacrum that go to the female organs (presacral neurectomy).  Electric current to the sacral nerves (sacral nerve stimulation).  Antidepressant medicine.  Psychiatric therapy, counseling, or group therapy.  Exercise and physical therapy.  Meditation and yoga therapy.  Acupuncture. HOME CARE INSTRUCTIONS   Only take over-the-counter or prescription medicines as directed by your health care provider.  Place a heating pad or hot water bottle on your lower back or abdomen. Do not sleep with the heating pad.  Use aerobic exercises, walking, swimming, biking, and other exercises to help lessen the cramping.  Massage to the lower back or abdomen may help.  Stop smoking.  Avoid alcohol and caffeine. SEEK MEDICAL CARE IF:   Your pain does not get better with medicine.  You have pain with sexual intercourse.  Your pain increases and is not controlled with medicines.  You have abnormal vaginal bleeding with your period.  You develop nausea or vomiting with your period that is not controlled with medicine. SEEK IMMEDIATE MEDICAL CARE IF:  You pass out.  Document Released: 03/26/2005 Document Revised: 11/26/2012 Document Reviewed: 09/11/2012 [facility] Patient Information 2014 [place], [place].   Emergency Department Resource Guide 1) Find a Doctor and Pay Out of Pocket Although you won't have to find out who is covered by your insurance plan, it is a good idea to ask around and get recommendations. You will then need to call the office and see if the doctor you have chosen  will accept you as a new patient and what types of options they offer for patients who are self-pay. Some doctors offer discounts or will set up payment plans for their patients who do not have insurance, but you will need to ask so you aren't surprised when you get to your appointment.  2) Contact Your Local Health Department Not all health departments have doctors that can see patients for sick visits, but many do, so it is worth a call to see if yours does. If you don't know where your local health department is, you can check in your phone book. The CDC also has a tool to help you locate your state's health department, and many state websites also have listings of all of their local health departments.  3) Find a Walk-in Clinic If your illness is not likely to be very severe or complicated, you may want to try a walk in clinic. These are popping up all over the country in pharmacies, drugstores, and shopping centers. They're usually staffed by nurse practitioners or physician assistants that have been trained to treat common illnesses and complaints. They're usually fairly quick and inexpensive. However, if you have serious medical issues or chronic medical problems, these are probably not your best option.  No Primary Care Doctor: - Call Health Connect at  [phone] - they can help you locate a primary care doctor that  accepts your insurance, provides certain services, etc. - Physician Referral Service-  [phone]  Chronic Pain Problems: Organization         Address  Phone   Notes  [facility]  [phone] Patients need to be referred by their primary care doctor.   Medication Assistance: Organization         Address  Phone   Notes  [facility] Medication [facility] [address] [place], [place] [address] [phone] --Must be a resident of [facility] -- Must have NO insurance coverage whatsoever (no Medicaid/ Medicare, etc.) --  The pt. MUST have a primary care doctor that directs their care regularly and follows them in the community   MedAssist  [phone]   [employer]  [phone]    Agencies that provide inexpensive medical care: Organization         Address  Phone   Notes  [place]  [phone]   [facility]    [phone]   [facility] [place], [place] [address] [phone]   [place] [address], [place] [phone]   Planned Parenthood    [phone]   [facility]    [phone]   [place] and [place] [address], [place] Phone:  [phone], Fax:  [phone] Hours of Operation:  9 am - 6 pm, M-F.  Also accepts Medicaid/Medicare and self-pay.  [facility] for [place], [address], [place] Phone: [phone], Fax: [phone]. Hours of Operation:  8:30 am - 5:30 pm, M-F.  Also accepts Medicaid and self-pay.  [facility] [place] [address] [place] Phone: [phone]   [place], [place], [place] [phone], Ext. [phone] Mondays & Thursdays: 7-9 AM.  First 15 patients are seen on a first come, first serve basis.    [place] Providers:  Organization         Address  Phone   Notes  [facility] [address], [place] [phone] Also accepts self-pay patients.  [facility] [address], [place]  [phone]   [place], [address], [place] [phone]   [facility] [address], [place] [phone]   Lucianne Lei [address], [place]   [phone] Only accepts [place] Access [place] patients after they have their name applied to their card.   Self-Pay (no insurance)  in [facility]:  Organization         Address  Phone   Notes  Sickle Cell Patients, [facility] Internal Medicine [place] [phone]   [facility] [place] [phone]   [facility] [place]  [place], [place], [place] [phone]   [facility]/Dr. Osei-Bonsu  [address] [place] or [address], [place] [phone] Phone number for both Micro and [place] locations is the same.  [facility] and [facility] [address], [place] [phone]   [facility] [address], [place] or [address] [phone]   [facility] [address], [place] [phone], phone; [phone],  fax Sees patients 1st and 3rd Saturday of every month.  Must not qualify for public or private insurance (i.e. Medicaid, Medicare, [place] Health Choice, Veterans' Benefits)  Household income should be no more than 200% of the poverty level The clinic cannot treat you if you are pregnant or think you are pregnant  Sexually transmitted diseases are not treated at the clinic.    Dental Care: Organization         Address  Phone  Notes  [facility] [address], [place] [phone] Accepts children up to age 21 who are enrolled in [place]icaid or [place] Health Choice; pregnant women with a Medicaid card; and children who have applied for Medicaid or [place] Health Choice, but were declined, whose parents can pay a reduced fee at time of service.  [facility] [place]  [address], [place] [phone] Accepts children up to age 21 who are enrolled in [place]icaid or [place] Health Choice; pregnant women with a Medicaid card; and children who have applied for Medicaid or [place] Health Choice, but were declined, whose  parents can pay a reduced fee at time of service.  [facility]  [address], [place] [phone] Patients are seen by appointment only. Walk-ins are not accepted. [facility] will see patients 21 years of age and older. Monday - Tuesday (8am-5pm) Most Wednesdays (8:30-5pm) $30 per visit, cash only  [facility]  [address], [facility] [phone] Patients are seen by appointment only. Walk-ins are not accepted. [facility] will see patients 818 years of age and older. One Wednesday Evening (Monthly: Volunteer Based).  $30 per visit, cash only  [employer] [facility]  [phone] for adults; Children under age 614, call Graduate Pediatric Dentistry at [phone]. Children aged 244-14, please call [phone] to request a pediatric application.  Dental services are provided in all areas of dental care including fillings, crowns and bridges, complete and partial dentures, implants, gum treatment, root canals, and extractions. Preventive care is also provided. Treatment is provided to both adults and children. Patients are selected via a lottery and there is often a waiting list.   [facility] [address], [place]  [phone] [email or web address]   [facility] [address], [place], [place] [phone], Ext. [phone] Second and Fourth Thursday of each month, opens at 6:30 AM; Clinic ends at 9 AM.  Patients are seen on a first-come first-served basis, and a limited number are seen during each clinic.   [facility]  [address], [place], [place] [phone]   Eligibility Requirements You must have lived in [place], [place], or [place] counties for at least the last three months.   You cannot be eligible for state or federal sponsored National Cityhealthcare insurance, including CIGNAVeterans Administration, [place]icaid, or [employer]icare.   You generally cannot be eligible for  healthcare insurance through your employer.    How to apply: Eligibility screenings are held every Tuesday and Wednesday afternoon from 1:00 pm until 4:00 pm. You do not need an appointment for the interview!  [facility] [address], [place], [place] [phone]   [facility]  [phone]   [facility]  [phone]   [facility]  [phone]    Behavioral Health Resources in the Community: Intensive  Outpatient Programs Organization         Address  Phone  Notes  [place] [address], [place], [place] [phone]   [facility] [address] [place], [place]   ADS: Alcohol & Drug Svcs [address] [place], [place]   [place] [address],  [place], [place] or [phone]   Substance Abuse Resources Organization         Address  Phone  Notes  Alcohol and Drug Services  [phone]   [place]  [phone]   [facility] [place]   Chinita Pester  [phone]   Residential & Outpatient Substance Abuse Program  [phone]   Psychological Services Organization         Address  Phone  Notes  [facility]  [place]  [phone]   [place] [address], [place] or [phone]    Mobile Crisis Teams Organization         Address  Phone  Notes  [facility], Mobile Crisis Care Unit  [phone]   [facility] Psychotherapeutic Services  [address] [place], [place]   [place] Levels [address] [place] [phone]    Self-Help/Support Groups Organization         Address  Phone             Notes  [place]. of [place] - variety of support groups  [place] Call for more information  Narcotics  Anonymous (NA), [facility]aring [facility] [address], [employer] [place]  2 meetings at this location   Special educational needs teacher         Address  Phone  Notes  ASAP Residential Treatment [place],    [place]  [phone]   [facility]  [address] [place] [address]017, [place], [place]   [place], [place] [phone] Admissions: 8am-3pm M-F  Incentives Substance [place] [address],    [place], [place] [phone]   [facility] [address] [place], [place], [place]   [facility] [address],  [place], [place]   [facility] Programs - Intensive Outpatient [address] Kristeen [address], [place], [place]   [facility] [address]  [place], [place] [phone] or [phone]   Residential Treatment Services (RTS) [address] [place], [place] Accepts Medicaid  [facility] [place] [address],  [place] [phone] Substance Abuse/Addiction Treatment   [facility] Organization         Address  Phone  Notes  [facility]  [phone]   Domenic Schwab, PhD [address] [place]lis [place], [place]   [phone] or [phone]   [place], [place] [phone]   [place] [address] [place], [place] [phone] Insurance/Medicaid/sponsorship through [facility] [address], [place]                                    [place], [place] [phone] [place] [address], [place] [phone]    Dr.  Arfeen  [phone]   [facility] [place] Dept. 1) [address] [place] 2) [place] 3)  [place] [address], [place] [phone]  [phone]   [place] [phone] or [phone] (After Hours)

## 2013-07-04 NOTE — ED Notes (Signed)
Up to the rest room 

## 2013-07-04 NOTE — ED Provider Notes (Signed)
CSN: [id number]     Arrival date & time 07/04/13  1152 History   First MD Initiated Contact with Patient 07/04/13 1204     Chief Complaint  Patient presents with  . Abdominal Pain     (Consider location/radiation/quality/duration/timing/severity/associated sxs/prior Treatment) The history is provided by the patient. No language interpreter was used.  Deborah Gibbs is a 23 y/o F with PMhx of abortion and allergies presenting to the ED with pelvic pain that started 2 days ago when patient started to have menstrual cycle. Patient reported that she has been bleeding a lot and stated that she has been having pain to the pelvic and suprapubic region for the past 3 days. Reported that she just got off of her Depo shots in November and reported that this is her first menstrual cycle since then. Reported that she has been feeling nauseous. Reported that the pain is localized to her pelvic region described as a pressure, cramping sensation without radiation. Reported that "I'm hurting." Stated that she was seen and assessed in the ED setting yesterday - reported that she has been taking Tramadol and Ibuprofen with no relief. Denied dysuria, numbness, tingling, chest pain, and shortness of breath, difficulty breathing, fever, chills. PCP none OB/GYN Dr. Lynnette Caffey   Past Medical History  Diagnosis Date  . Allergy   . Abortion    History reviewed. No pertinent past surgical history. Family History  Problem Relation Age of Onset  . Hypertension Father    History  Substance Use Topics  . Smoking status: Never Smoker   . Smokeless tobacco: Not on file  . Alcohol Use: No   OB History   Grav Para Term Preterm Abortions TAB SAB Ect Mult Living                 Review of Systems  Constitutional: Negative for fever and chills.  Eyes: Negative for visual disturbance.  Respiratory: Negative for chest tightness and shortness of breath.   Cardiovascular: Negative for chest pain.  Gastrointestinal:  Positive for nausea and vomiting. Negative for abdominal pain.  Genitourinary: Positive for vaginal bleeding (currently menstruating) and pelvic pain. Negative for dysuria, hematuria, vaginal discharge and vaginal pain.  Musculoskeletal: Positive for back pain.  All other systems reviewed and are negative.      Allergies  Diphenhydramine hcl and Penicillins  Home Medications   Current Outpatient Rx  Name  Route  Sig  Dispense  Refill  . ibuprofen (ADVIL,MOTRIN) 200 MG tablet   Oral   Take 200-400 mg by mouth every 6 (six) hours as needed for fever, headache, mild pain or moderate pain.         . traMADol (ULTRAM) 50 MG tablet   Oral   Take 1 tablet (50 mg total) by mouth every 6 (six) hours as needed.   15 tablet   0    BP 109/68  Pulse 65  Temp(Src) 98 F (36.7 C) (Oral)  Resp 18  Ht 5\' 3"  (1.6 m)  Wt 160 lb (72.576 kg)  BMI 28.35 kg/m2  SpO2 99% Physical Exam  Nursing note and vitals reviewed. Constitutional: She appears well-developed and well-nourished. No distress.  Patient hysterical crying and rolling around in bed  HENT:  Head: Normocephalic and atraumatic.  Mouth/Throat: Oropharynx is clear and moist. No oropharyngeal exudate.  Eyes: Conjunctivae and EOM are normal. Pupils are equal, round, and reactive to light. Right eye exhibits no discharge. Left eye exhibits no discharge.  Neck: Normal range of  motion. Neck supple. No tracheal deviation present.  Cardiovascular: Normal rate, regular rhythm and normal heart sounds.  Exam reveals no friction rub.   No murmur heard. Pulses:      Radial pulses are 2+ on the right side, and 2+ on the left side.       Dorsalis pedis pulses are 2+ on the right side, and 2+ on the left side.  Pulmonary/Chest: Effort normal and breath sounds normal. No respiratory distress. She has no wheezes. She has no rales.  Abdominal: Soft. Normal appearance and bowel sounds are normal. There is tenderness in the suprapubic area. There  is no rigidity, no guarding, no tenderness at McBurney's point and negative Murphy's sign.    Discomfort upon palpation to right pelvic and left pelvic region, suprapubic region Soft upon palpation  Musculoskeletal: Normal range of motion.  Full ROM to upper and lower extremities without difficulty noted, negative ataxia noted.  Lymphadenopathy:    She has no cervical adenopathy.  Neurological: She is alert. No cranial nerve deficit. She exhibits normal muscle tone. Coordination normal.  Skin: Skin is warm and dry. No rash noted. She is not diaphoretic. No erythema.  Psychiatric: She has a normal mood and affect. Her behavior is normal. Thought content normal.    ED Course  Procedures (including critical care time)  Results for orders placed during the hospital encounter of 07/04/13  CBC      Result Value Ref Range   WBC 9.5  4.0 - 10.5 K/uL   RBC 4.28  3.87 - 5.11 MIL/uL   Hemoglobin 12.6  12.0 - 15.0 g/dL   HCT 36.0  36.0 - 46.0 %   MCV 84.1  78.0 - 100.0 fL   MCH 29.4  26.0 - 34.0 pg   MCHC 35.0  30.0 - 36.0 g/dL   RDW 12.7  11.5 - 15.5 %   Platelets 267  150 - 400 K/uL  BASIC METABOLIC PANEL      Result Value Ref Range   Sodium 140  137 - 147 mEq/L   Potassium 3.7  3.7 - 5.3 mEq/L   Chloride 102  96 - 112 mEq/L   CO2 24  19 - 32 mEq/L   Glucose, Bld 74  70 - 99 mg/dL   BUN 6  6 - 23 mg/dL   Creatinine, Ser 0.65  0.50 - 1.10 mg/dL   Calcium 9.1  8.4 - 10.5 mg/dL   GFR calc non Af Amer >90  >90 mL/min   GFR calc Af Amer >90  >90 mL/min    Labs Review Labs Reviewed  CBC  BASIC METABOLIC PANEL   Imaging Review US Transvaginal Non-ob  07/03/2013   CLINICAL DATA:  Pelvic pain  EXAM: TRANSABDOMINAL AND TRANSVAGINAL ULTRASOUND OF PELVIS  DOPPLER ULTRASOUND OF OVARIES  TECHNIQUE: Both transabdominal and transvaginal ultrasound examinations of the pelvis were performed. Transabdominal technique was performed for global imaging of the pelvis including uterus, ovaries,  adnexal regions, and pelvic cul-de-sac.  It was necessary to proceed with endovaginal exam following the transabdominal exam to visualize the ovaries and endometrium. Color and duplex Doppler ultrasound was utilized to evaluate blood flow to the ovaries.  COMPARISON:  None.  FINDINGS: Uterus  Measurements: 5.5 x 4 x 4.8 cm. No fibroids or other mass visualized.  Endometrium  Thickness: 5.7 mm.  No focal abnormality visualized.  Right ovary  Measurements: 3.3 x 2.4 x 2.2 cm. There is a small right ovarian follicle. Normal appearance/no adnexal mass.  Left ovary  Measurements: 3.3 x 1 x 0.9 cm. Normal appearance/no adnexal mass.  Pulsed Doppler evaluation of both ovaries demonstrates normal low-resistance arterial and venous waveforms.  Other findings  There is a trace amount of pelvic free fluid likely physiologic.  IMPRESSION: 1. Normal pelvic ultrasound. 2. No ovarian torsion.   Electronically Signed   By: Kathreen Devoid   On: 07/03/2013 10:54   US Pelvis Complete  07/03/2013   CLINICAL DATA:  Pelvic pain  EXAM: TRANSABDOMINAL AND TRANSVAGINAL ULTRASOUND OF PELVIS  DOPPLER ULTRASOUND OF OVARIES  TECHNIQUE: Both transabdominal and transvaginal ultrasound examinations of the pelvis were performed. Transabdominal technique was performed for global imaging of the pelvis including uterus, ovaries, adnexal regions, and pelvic cul-de-sac.  It was necessary to proceed with endovaginal exam following the transabdominal exam to visualize the ovaries and endometrium. Color and duplex Doppler ultrasound was utilized to evaluate blood flow to the ovaries.  COMPARISON:  None.  FINDINGS: Uterus  Measurements: 5.5 x 4 x 4.8 cm. No fibroids or other mass visualized.  Endometrium  Thickness: 5.7 mm.  No focal abnormality visualized.  Right ovary  Measurements: 3.3 x 2.4 x 2.2 cm. There is a small right ovarian follicle. Normal appearance/no adnexal mass.  Left ovary  Measurements: 3.3 x 1 x 0.9 cm. Normal appearance/no adnexal  mass.  Pulsed Doppler evaluation of both ovaries demonstrates normal low-resistance arterial and venous waveforms.  Other findings  There is a trace amount of pelvic free fluid likely physiologic.  IMPRESSION: 1. Normal pelvic ultrasound. 2. No ovarian torsion.   Electronically Signed   By: Kathreen Devoid   On: 07/03/2013 10:54   [place] Art/ven Flow Abd Pelv Doppler  07/03/2013   CLINICAL DATA:  Pelvic pain  EXAM: TRANSABDOMINAL AND TRANSVAGINAL ULTRASOUND OF PELVIS  DOPPLER ULTRASOUND OF OVARIES  TECHNIQUE: Both transabdominal and transvaginal ultrasound examinations of the pelvis were performed. Transabdominal technique was performed for global imaging of the pelvis including uterus, ovaries, adnexal regions, and pelvic cul-de-sac.  It was necessary to proceed with endovaginal exam following the transabdominal exam to visualize the ovaries and endometrium. Color and duplex Doppler ultrasound was utilized to evaluate blood flow to the ovaries.  COMPARISON:  None.  FINDINGS: Uterus  Measurements: 5.5 x 4 x 4.8 cm. No fibroids or other mass visualized.  Endometrium  Thickness: 5.7 mm.  No focal abnormality visualized.  Right ovary  Measurements: 3.3 x 2.4 x 2.2 cm. There is a small right ovarian follicle. Normal appearance/no adnexal mass.  Left ovary  Measurements: 3.3 x 1 x 0.9 cm. Normal appearance/no adnexal mass.  Pulsed Doppler evaluation of both ovaries demonstrates normal low-resistance arterial and venous waveforms.  Other findings  There is a trace amount of pelvic free fluid likely physiologic.  IMPRESSION: 1. Normal pelvic ultrasound. 2. No ovarian torsion.   Electronically Signed   By: Kathreen Devoid   On: 07/03/2013 10:54     EKG Interpretation None      MDM   Final diagnoses:  Dysmenorrhea  Vaginal bleeding    Medications  ketorolac (TORADOL) injection 60 mg (60 mg Intramuscular Given 07/04/13 1320)  metoCLOPramide (REGLAN) injection 10 mg (10 mg Intramuscular Given 07/04/13 1321)   Filed  Vitals:   07/04/13 1330 07/04/13 1342 07/04/13 1345 07/04/13 1400  BP:  121/70  109/68  Pulse: 63 71 65   Temp:      TempSrc:      Resp:  18    Height:  Weight:      SpO2: 98% 100% 99%    Patient presenting to the ED with pelvic pain that started 2 days ago when patient started her first menstrual cycle after stopping Depo in November. Patient reported that the pain is localized to her pelvic region described as a pressure sensation without radiation with heavy bleeding. Stated that she has been feeling nauseous and had one episode of emesis this morning.  Alert and oriented. GCS 15. Heart rate and rhythm normal. Lungs clear to auscultation to upper and lower lobes bilaterally. Radial pulses 2+ bilaterally and DP pulses 2+ bilaterally. Cap refill less than 3 seconds. Bowel sounds normoactive in all 4 quadrants, soft discomfort upon palpation to the suprapubic and bilateral pelvic regions. This provider reviewed patient's chart. Patient seen and assessed in ED setting yesterday, 07/03/2013. Urine pregnancy negative. Urinalysis noted trace of leukocytes with negative findings of infection or nitrites. CBC negative findings-negative elevated white blood cell count, hemoglobin 12.6. CMP noted mild low potassium-unremarkable findings-liver and kidney functioning well. Ultrasounds pelvis was negative. Ultrasound Doppler was negative for ovarian torsion. Patient presenting to the ED with dysmenorrhea. Will treat the patient for pain, check hemoglobin and discharge home. CBC negative elevation white blood cell count-hemoglobin 12.6. BMP negative findings. Negative drop in hemoglobin. Pain medications administered in ED setting with positive relief. Patient stable, afebrile. Patient not septic appearing. Negative tachypnea tachycardia identified. Suspicion of discomfort to be due to dysmenorrhea. Discharged patient. Discussed with patient to continue to take medications as prescribed. Discussed with  patient that she needs to followup with her OB/GYN regarding this issue. Discussed with patient to rest and stay hydrated. Discussed with patient to closely monitor symptoms and if symptoms are to worsen or change to report back to the ED - strict return instructions given.  Patient agreed to plan of care, understood, all questions answered.    Jamse Mead, PA-C 07/05/13 [phone]

## 2013-07-04 NOTE — ED Notes (Signed)
Pt reports being seen here yesterday for same lower abd pain. Was given prescription for tramadol but no relief noted.

## 2013-07-05 NOTE — ED Provider Notes (Signed)
Medical screening examination/treatment/procedure(s) were performed by non-physician practitioner and as supervising physician I was immediately available for consultation/collaboration.   EKG Interpretation None        Tanna Furry, MD 07/05/13 1057

## 2013-07-09 ENCOUNTER — Encounter (HOSPITAL_COMMUNITY): Payer: Self-pay | Admitting: Emergency Medicine

## 2013-07-09 ENCOUNTER — Emergency Department (HOSPITAL_COMMUNITY)
Admission: EM | Admit: 2013-07-09 | Discharge: 2013-07-09 | Disposition: A | Discharge status: Home / Self Care | Payer: 59 | Attending: Emergency Medicine | Admitting: Emergency Medicine

## 2013-07-09 ENCOUNTER — Emergency Department (HOSPITAL_COMMUNITY): Payer: 59

## 2013-07-09 DIAGNOSIS — Z3202 Encounter for pregnancy test, result negative: Secondary | ICD-10-CM | POA: Insufficient documentation

## 2013-07-09 DIAGNOSIS — N925 Other specified irregular menstruation: Secondary | ICD-10-CM | POA: Insufficient documentation

## 2013-07-09 DIAGNOSIS — Z880 Allergy status to penicillin: Secondary | ICD-10-CM | POA: Insufficient documentation

## 2013-07-09 DIAGNOSIS — N949 Unspecified condition associated with female genital organs and menstrual cycle: Secondary | ICD-10-CM | POA: Insufficient documentation

## 2013-07-09 DIAGNOSIS — N938 Other specified abnormal uterine and vaginal bleeding: Secondary | ICD-10-CM | POA: Insufficient documentation

## 2013-07-09 DIAGNOSIS — R109 Unspecified abdominal pain: Secondary | ICD-10-CM

## 2013-07-09 LAB — URINALYSIS, ROUTINE W REFLEX MICROSCOPIC
Bilirubin Urine: NEGATIVE
Glucose, UA: NEGATIVE mg/dL
Hgb urine dipstick: NEGATIVE
Ketones, ur: NEGATIVE mg/dL
Leukocytes, UA: NEGATIVE
Nitrite: NEGATIVE
Protein, ur: NEGATIVE mg/dL
Specific Gravity, Urine: 1.022 (ref 1.005–1.030)
Urobilinogen, UA: 1 mg/dL (ref 0.0–1.0)
pH: 6 (ref 5.0–8.0)

## 2013-07-09 LAB — COMPREHENSIVE METABOLIC PANEL
ALT: 8 U/L (ref 0–35)
AST: 15 U/L (ref 0–37)
Albumin: 4 g/dL (ref 3.5–5.2)
Alkaline Phosphatase: 40 U/L (ref 39–117)
BUN: 8 mg/dL (ref 6–23)
CO2: 22 mEq/L (ref 19–32)
Calcium: 9 mg/dL (ref 8.4–10.5)
Chloride: 106 mEq/L (ref 96–112)
Creatinine, Ser: 0.58 mg/dL (ref 0.50–1.10)
GFR calc Af Amer: >90 mL/min (ref >90)
GFR calc non Af Amer: >90 mL/min (ref >90)
Glucose, Bld: 87 mg/dL (ref 70–99)
Potassium: 3.8 mEq/L (ref 3.7–5.3)
Sodium: 143 mEq/L (ref 137–147)
Total Bilirubin: 0.5 mg/dL (ref 0.3–1.2)
Total Protein: 7 g/dL (ref 6.0–8.3)

## 2013-07-09 LAB — CBC WITH DIFFERENTIAL/PLATELET
Basophils Absolute: 0 10*3/uL (ref 0.0–0.1)
Basophils Relative: 0 % (ref 0–1)
Eosinophils Absolute: 0.1 10*3/uL (ref 0.0–0.7)
Eosinophils Relative: 2 % (ref 0–5)
HCT: 36.2 % (ref 36.0–46.0)
Hemoglobin: 12.6 g/dL (ref 12.0–15.0)
Lymphocytes Relative: 33 % (ref 12–46)
Lymphs Abs: 2.5 10*3/uL (ref 0.7–4.0)
MCH: 29.4 pg (ref 26.0–34.0)
MCHC: 34.8 g/dL (ref 30.0–36.0)
MCV: 84.6 fL (ref 78.0–100.0)
Monocytes Absolute: 0.5 10*3/uL (ref 0.1–1.0)
Monocytes Relative: 7 % (ref 3–12)
Neutro Abs: 4.4 10*3/uL (ref 1.7–7.7)
Neutrophils Relative %: 58 % (ref 43–77)
Platelets: 273 10*3/uL (ref 150–400)
RBC: 4.28 MIL/uL (ref 3.87–5.11)
RDW: 12.6 % (ref 11.5–15.5)
WBC: 7.6 10*3/uL (ref 4.0–10.5)

## 2013-07-09 LAB — PREGNANCY, URINE: Preg Test, Ur: NEGATIVE

## 2013-07-09 MED ORDER — IOHEXOL 300 MG/ML  SOLN
25.0000 mL | Freq: Once | INTRAMUSCULAR | Status: AC | PRN
  Administered 2013-07-09: 25 mL via ORAL

## 2013-07-09 MED ORDER — SODIUM CHLORIDE 0.9 % IV BOLUS (SEPSIS)
1000.0000 mL | Freq: Once | INTRAVENOUS | Status: AC
  Administered 2013-07-09: 1000 mL via INTRAVENOUS

## 2013-07-09 MED ORDER — HYDROMORPHONE HCL PF 1 MG/ML IJ SOLN
1.0000 mg | Freq: Once | INTRAMUSCULAR | Status: AC
  Administered 2013-07-09: 1 mg via INTRAVENOUS
  Filled 2013-07-09: qty 1

## 2013-07-09 MED ORDER — IOHEXOL 300 MG/ML  SOLN
100.0000 mL | Freq: Once | INTRAMUSCULAR | Status: AC | PRN
  Administered 2013-07-09: 100 mL via INTRAVENOUS

## 2013-07-09 MED ORDER — KETOROLAC TROMETHAMINE 15 MG/ML IJ SOLN
15.0000 mg | Freq: Once | INTRAMUSCULAR | Status: AC
  Administered 2013-07-09: 15 mg via INTRAVENOUS
  Filled 2013-07-09: qty 1

## 2013-07-09 MED ORDER — ONDANSETRON HCL 4 MG/2ML IJ SOLN
4.0000 mg | Freq: Once | INTRAMUSCULAR | Status: AC
  Administered 2013-07-09: 4 mg via INTRAVENOUS
  Filled 2013-07-09: qty 2

## 2013-07-09 NOTE — ED Notes (Signed)
Dr. Wilson Singer at bedside reviewing discharge instructions with patient. Pt has a ride coming to get her,as she drove herself here.

## 2013-07-09 NOTE — ED Notes (Signed)
Pt is drinking her oral contrast for CT.

## 2013-07-09 NOTE — Discharge Instructions (Signed)
Abdominal Pain, Adult °Many things can cause abdominal pain. Usually, abdominal pain is not caused by a disease and will improve without treatment. It can often be observed and treated at home. Your health care provider will do a physical exam and possibly order blood tests and X-rays to help determine the seriousness of your pain. However, in many cases, more time must pass before a clear cause of the pain can be found. Before that point, your health care provider may not know if you need more testing or further treatment. °HOME CARE INSTRUCTIONS  °Monitor your abdominal pain for any changes. The following actions may help to alleviate any discomfort you are experiencing: °· Only take over-the-counter or prescription medicines as directed by your health care provider. °· Do not take laxatives unless directed to do so by your health care provider. °· Try a clear liquid diet (broth, tea, or water) as directed by your health care provider. Slowly move to a bland diet as tolerated. °SEEK MEDICAL CARE IF: °· You have unexplained abdominal pain. °· You have abdominal pain associated with nausea or diarrhea. °· You have pain when you urinate or have a bowel movement. °· You experience abdominal pain that wakes you in the night. °· You have abdominal pain that is worsened or improved by eating food. °· You have abdominal pain that is worsened with eating fatty foods. °SEEK IMMEDIATE MEDICAL CARE IF:  °· Your pain does not go away within 2 hours. °· You have a fever. °· You keep throwing up (vomiting). °· Your pain is felt only in portions of the abdomen, such as the right side or the left lower portion of the abdomen. °· You pass bloody or black tarry stools. °MAKE SURE YOU: °· Understand these instructions.   °· Will watch your condition.   °· Will get help right away if you are not doing well or get worse.   °Document Released: 01/03/2005 Document Revised: 01/14/2013 Document Reviewed: 12/03/2012 °ExitCare® Patient  Information ©2014 [employer]. °  Emergency Department Resource Guide °1) Find a Doctor and Pay Out of Pocket °Although you won't have to find out who is covered by your insurance plan, it is a good idea to ask around and get recommendations. You will then need to call the office and see if the doctor you have chosen will accept you as a new patient and what types of options they offer for patients who are self-pay. Some doctors offer discounts or will set up payment plans for their patients who do not have insurance, but you will need to ask so you aren't surprised when you get to your appointment. ° °2) Contact Your Local Health Department °Not all health departments have doctors that can see patients for sick visits, but many do, so it is worth a call to see if yours does. If you don't know where your local health department is, you can check in your phone book. The CDC also has a tool to help you locate your state's health department, and many state websites also have listings of all of their local health departments. ° °3) Find a Walk-in Clinic °If your illness is not likely to be very severe or complicated, you may want to try a walk in clinic. These are popping up all over the country in pharmacies, drugstores, and shopping centers. They're usually staffed by nurse practitioners or physician assistants that have been trained to treat common illnesses and complaints. They're usually fairly quick and inexpensive. However, if you have   serious medical issues or chronic medical problems, these are probably not your best option. ° °No Primary Care Doctor: °- Call [facility] at  832-8000 - they can help you locate a primary care doctor that  accepts your insurance, provides certain services, etc. °- Physician Referral Service- 1-800-533-3463 ° °Chronic Pain Problems: °Organization         Address  Phone   Notes  °[facility]  (336) 297-2271 Patients need to be referred by their primary care  doctor.  ° °Medication Assistance: °Organization         Address  Phone   Notes  °[place] [facility] [address] °[place], [place] [address] (336) 641-8030 --Must be a resident of [place] °-- Must have NO insurance coverage whatsoever (no Medicaid/ Medicare, etc.) °-- The pt. MUST have a primary care doctor that directs their care regularly and follows them in the community °  °[facility]  (866) 331-1348   °[employer]  (888) 892-1162   ° °Agencies that provide inexpensive medical care: °Organization         Address  Phone   Notes  °[facility]  (336) 832-8035   °[facility]    (336) 832-7272   °[facility] [address] °[place], [place] [address] (336) 832-4777   °[facility] [address], °[place] (336) 271-4999   °[facility]    (336) 373-0678   °[facility]    (336) 272-1050   °[facility] ° [address], [place] Phone:  (336) 832-4444, Fax:  (336) 832-4440 Hours of Operation:  9 am - 6 pm, M-F.  Also accepts Medicaid/Medicare and self-pay.  °[facility] ° [address], [place] Phone: (336) 832-3150, Fax: (336) 832-3151. Hours of Operation:  8:30 am - 5:30 pm, M-F.  Also accepts Medicaid and self-pay.  °[facility] [place] [address], [place] Phone: (336) 878-6027   °[facility] [address], [place], [place] (336)723-1848, Ext. 123 Mondays & Thursdays: 7-9 AM.  First 15 patients are seen on a first come, first serve basis. °  ° °Medicaid-accepting [place] Providers: ° °Organization         Address  Phone   Notes  °[facility] [address], [place] (336) 641-2100 Also accepts self-pay patients.  °[facility] [address], [place] ° (336) 856-9996   °[facility] [address], [place] (336) 288-8857   °[facility] [address] [place] [address], [place] (336) 299-7000   °Veita Bland [address], [place]  ° (336) 373-1557 Only accepts [place] Access Medicaid patients after they have their name applied to their card.  ° °Self-Pay (no insurance) in [place]: ° °Organization         Address  Phone   Notes  °Sickle Cell Patients, [facility] [address], [place] (336) 832-1970   °[facility] [address], [place] (336) 832-4400   °[facility] ° [address], [place] (336) 992-4800   °[facility]/Dr. Osei-Bonsu ° [address] [place] [address], [place] or [address], [place] (336) 841-8500 Phone number for both [place] and  locations   is the same.  °[facility] [address], [place] (336) 299-0000   °[facility] [address] [place] [address], [place] or [address] (336) 852-7530 °(336) 878-2260   °[facility] [address], [place] (336) 350-1642, phone; (336) 294-5005, fax Sees patients 1st and 3rd Saturday of every month.  Must not qualify for public or private insurance (i.e. Medicaid, Medicare, [place] Health Choice, Veterans' Benefits) • Household income should be no more than 200% of the poverty level •The clinic cannot treat you if you are pregnant or think you are pregnant • Sexually transmitted diseases are not treated at the clinic.  ° °Dental Care: °Organization         Address  Phone  Notes  °[facility] [facility] [address], [place] (336) 641-6152 Accepts children up to age 21 who are enrolled in Medicaid or [place] Health Choice; pregnant women with a Medicaid card; and children who have applied for Medicaid or [place] Health Choice, but were declined, whose parents can pay a reduced fee at time of  service.  °[facility] [place]  [address], [place] (336) 641-7733 Accepts children up to age 21 who are enrolled in Medicaid or [place] Health Choice; pregnant women with a Medicaid card; and children who have applied for Medicaid or [place] Health Choice, but were declined, whose parents can pay a reduced fee at time of service.  °[facility] ° [address], [place] (336) 641-4533 Patients are seen by appointment only. Walk-ins are not accepted. [facility] will see patients 18 years of age and older. °Monday - Tuesday (8am-5pm) °Most Wednesdays (8:30-5pm) °$30 per visit, cash only  °[facility] ° [address], [place] (336) 641-4533 Patients are seen by appointment only. Walk-ins are not accepted. [facility] will see patients 18 years of age and older. °One Wednesday Evening (Monthly: Volunteer Based).  $30 per visit, cash only  °[facility]  (919) 537-3737 for adults; Children under age 4, call Graduate Pediatric Dentistry at (919) [phone]. Children aged 4-14, please call (919) [phone] to request a pediatric application. ° Dental services are provided in all areas of dental care including fillings, crowns and bridges, complete and partial dentures, implants, gum treatment, root canals, and extractions. Preventive care is also provided. Treatment is provided to both adults and children. °Patients are selected via a lottery and there is often a waiting list. °  °[facility] [address], °[place] ° (336) 763-8833 [email or web address] °  °[facility] [address], [place], [place] (336)723-1848, Ext. 123 Second and Fourth Thursday of each month, opens at 6:30 AM; Clinic ends at 9 AM.  Patients are seen on a first-come first-served basis, and a limited number are seen during each clinic.  ° °[facility] ° [address], [place],  [place] (336) 723-7904   Eligibility Requirements °You must have lived in [place], [place], or [place] counties for at least the last three months. °  You cannot be eligible for state or federal sponsored healthcare insurance, including Veterans Administration, Medicaid, or Medicare. °  You generally cannot be eligible for healthcare insurance through your employer.  °  How to apply: °Eligibility screenings are held every Tuesday and Wednesday afternoon from 1:00 pm until 4:00 pm. You do not need an appointment   for the interview!  °[facility] [address], [place], [place] 336-631-2330   °[facility]  336-342-8273   °[facility]  336-703-3100   °[facility]  336-570-6415   ° °Behavioral Health Resources in the Community: °Intensive Outpatient Programs °Organization         Address  Phone  Notes  °[facility] [address], [place], [place] 336-878-6098   °[facility] [address], [place], [place] 336-832-9800   °[facility] [address], [place], [place] ° 336-882-2125   °[facility] [address],  °[place], [place] 1-800-853-5163 or 336-641-4981   °Substance Abuse Resources °Organization         Address  Phone  Notes  °[facility]  336-882-2125   °[facility]  336-784-9470   °[facility]  336-285-9073   °[facility]  336-845-3988   °Residential & Outpatient Substance Abuse Program  1-800-659-3381   °Psychological Services °Organization         Address  Phone  Notes  °[facility]  336- 832-9600   °[facility]  336- 378-7881   °[facility] [address], [place] 1-800-853-5163 or 336-641-4981   ° °Mobile Crisis Teams °Organization         Address  Phone  Notes  °[facility], Mobile Crisis Care Unit  1-877-626-1772   °Assertive °[facility] ° [address] [place], [place] 336-834-9664   °Sharon DeEsch [address] °[place] 336-554-5454   ° °Self-Help/Support Groups °Organization         Address  Phone             Notes  °[facility] [place] - variety of support groups  336- 373-1402 Call for more information  °Narcotics Anonymous (NA), [facility] [address], °[place]  2 meetings at this location  ° °Residential Treatment Programs °Organization         Address  Phone  Notes  °[facility] [address],    °[place]  1-866-801-8205   °[facility] ° [address], [place], [place] 704-293-8524   °[facility] [address], [place] 336-845-3988 Admissions: 8am-3pm M-F  °[facility] [address]    °[place], [place] 336-841-1104   °[facility] [address], [place], [place] 336-379-7146   °[facility] [address]  °[place], [place] 336-285-9073   °[facility] - Intensive Outpatient [address], , [place] 336-852-3033   °[facility] [address]  °[place], [place] 1-877-615-2722 or 336-784-9470   °[facility] [address] [place], [place] 336-227-7417 Accepts Medicaid  °[facility] [address]  ° [place] 1-800-659-3381 Substance Abuse/Addiction Treatment  ° °[place] Behavioral Health Resources °Organization         Address  Phone  Notes  °[facility]  (888) 581-9988   °Julie Brannon, PhD [address] [place], [place]   (336) 349-5553 or (336) 951-0000   °[facility]   [address] °[place], [place] (336) 349-4454   °[facility] [address], [place],  (336) 342-8316 Insurance/Medicaid/sponsorship through [facility]  °[facility] [address]                                      [place], [place] (336) 342-8316  Therapy/tele-psych/case  °[facility] [address]  ° [place], [place] (336) 349-2233    °Dr. Arfeen  (336) 349-4544   °[facility] [place]  [employer] [place] [facility] 1) [address], [place] °2) [address], [place] °3)  [address], [place] (336) 349-3220 °(336) 342-7768 ° °(336) 342-8140   °[place] [facility] (336) 342-1394 or (336) 342-3537 (After Hours)    ° °   °

## 2013-07-09 NOTE — ED Notes (Signed)
Notified CT pt finished with oral contrast.  

## 2013-07-09 NOTE — ED Notes (Signed)
The pt is c/o abd pain  And has been seen for the same x3 .  The last time was march 28th.  [id number]

## 2013-07-13 NOTE — ED Provider Notes (Signed)
CSN: [id number]     Arrival date & time 07/09/13  2353 History   First MD Initiated Contact with Patient 07/09/13 0701     Chief Complaint  Patient presents with  . Abdominal Pain     (Consider location/radiation/quality/duration/timing/severity/associated sxs/prior Treatment) HPI  22yF with abdominal pain. Lower abdomen, worst suprapubically. No urinary complaints. Unusually heavy vaginal bleeding. Doesn't think pregnant. Seen in ED x2 recently for same. W/u unrevealing. Has been taking tramadol/ibuprofen with only mild relief. No fever or chills. Denies significant change in symptoms since onset. Coming back because of not improvement.   Past Medical History  Diagnosis Date  . Allergy   . Abortion    History reviewed. No pertinent past surgical history. Family History  Problem Relation Age of Onset  . Hypertension Father    History  Substance Use Topics  . Smoking status: Never Smoker   . Smokeless tobacco: Not on file  . Alcohol Use: No   OB History   Grav Para Term Preterm Abortions TAB SAB Ect Mult Living                 Review of Systems  All systems reviewed and negative, other than as noted in HPI.   Allergies  Diphenhydramine hcl and Penicillins  Home Medications   Current Outpatient Rx  Name  Route  Sig  Dispense  Refill  . ibuprofen (ADVIL,MOTRIN) 200 MG tablet   Oral   Take 200-400 mg by mouth every 6 (six) hours as needed for fever, headache, mild pain or moderate pain.         . traMADol (ULTRAM) 50 MG tablet   Oral   Take 50 mg by mouth every 6 (six) hours as needed for moderate pain.          BP 116/74  Pulse 55  Temp(Src) 98.4 F (36.9 C) (Oral)  Resp 18  SpO2 100%  LMP 07/04/2013 Physical Exam  Nursing note and vitals reviewed. Constitutional: She appears well-developed and well-nourished. No distress.  HENT:  Head: Normocephalic and atraumatic.  Eyes: Conjunctivae are normal. Right eye exhibits no discharge. Left eye exhibits  no discharge.  Neck: Neck supple.  Cardiovascular: Normal rate, regular rhythm and normal heart sounds.  Exam reveals no gallop and no friction rub.   No murmur heard. Pulmonary/Chest: Effort normal and breath sounds normal. No respiratory distress.  Abdominal: Soft. She exhibits no distension and no mass. There is tenderness. There is no rebound and no guarding.  Lower abdominal tenderness w/o rebound or guarding. No distension. No cva tenderness  Musculoskeletal: She exhibits no edema and no tenderness.  Neurological: She is alert.  Skin: Skin is warm and dry.  Psychiatric: She has a normal mood and affect. Her behavior is normal. Thought content normal.    ED Course  Procedures (including critical care time) Labs Review Labs Reviewed  URINALYSIS, ROUTINE W REFLEX MICROSCOPIC - Abnormal; Notable for the following:    APPearance HAZY (*)    All other components within normal limits  COMPREHENSIVE METABOLIC PANEL  CBC WITH DIFFERENTIAL  PREGNANCY, URINE   Imaging Review No results found.  Ct Abdomen Pelvis W Contrast  07/09/2013   CLINICAL DATA:  Abdominal pain.  EXAM: CT ABDOMEN AND PELVIS WITH CONTRAST  TECHNIQUE: Multidetector CT imaging of the abdomen and pelvis was performed using the standard protocol following bolus administration of intravenous contrast.  CONTRAST:  100 mL OMNIPAQUE IOHEXOL 300 MG/ML  SOLN  COMPARISON:  CT chest, abdomen  and pelvis 08/13/2012. Pelvic ultrasound 07/03/2013.  FINDINGS: The lung bases are clear.  No pleural or pericardial effusion.  The liver, gallbladder, spleen, adrenal glands, pancreas and kidneys all appear normal. Uterus, adnexa and urinary bladder are normal in appearance. Tiny amount of free pelvic fluid is consistent with physiologic change. The stomach, small and large bowel and appendix all appear normal. There is no lymphadenopathy. No bony abnormality is identified.  IMPRESSION: Negative abdomen and pelvis CT scan.   Electronically Signed    By: Inge Rise M.D.   On: 07/09/2013 09:36    EKG Interpretation None      MDM   Final diagnoses:  Abdominal pain    22yf with abdominal pain. Fairly extensive w/u at this point w/o clear etiology. Low suspicion for an emergent process. HD stable. No peritonitis. Labs/UA unremarkable. Symptoms improved. Return precautions discussed. Outpt FU.     Virgel Manifold, MD 07/13/13 [phone]

## 2013-07-29 ENCOUNTER — Other Ambulatory Visit: Payer: Self-pay | Admitting: Gastroenterology

## 2013-07-29 DIAGNOSIS — R109 Unspecified abdominal pain: Secondary | ICD-10-CM

## 2013-08-10 ENCOUNTER — Other Ambulatory Visit: Payer: 59

## 2013-08-14 ENCOUNTER — Inpatient Hospital Stay: Admission: RE | Admit: 2013-08-14 | Payer: 59 | Source: Ambulatory Visit

## 2013-09-03 ENCOUNTER — Other Ambulatory Visit: Payer: Self-pay | Admitting: Family Medicine

## 2013-09-03 DIAGNOSIS — R109 Unspecified abdominal pain: Secondary | ICD-10-CM

## 2013-09-04 ENCOUNTER — Ambulatory Visit
Admission: RE | Admit: 2013-09-04 | Discharge: 2013-09-04 | Disposition: A | Discharge status: Home / Self Care | Payer: 59 | Source: Ambulatory Visit | Attending: Family Medicine | Admitting: Family Medicine

## 2013-09-04 ENCOUNTER — Other Ambulatory Visit: Payer: Self-pay | Admitting: Family Medicine

## 2013-09-04 DIAGNOSIS — R0602 Shortness of breath: Secondary | ICD-10-CM

## 2013-09-04 DIAGNOSIS — R109 Unspecified abdominal pain: Secondary | ICD-10-CM

## 2013-09-04 DIAGNOSIS — R079 Chest pain, unspecified: Secondary | ICD-10-CM

## 2013-11-18 ENCOUNTER — Encounter (HOSPITAL_COMMUNITY): Payer: Self-pay | Admitting: Emergency Medicine

## 2013-11-18 ENCOUNTER — Emergency Department (HOSPITAL_COMMUNITY)
Admission: EM | Admit: 2013-11-18 | Discharge: 2013-11-18 | Disposition: A | Discharge status: Home / Self Care | Payer: 59 | Attending: Emergency Medicine | Admitting: Emergency Medicine

## 2013-11-18 DIAGNOSIS — R0602 Shortness of breath: Secondary | ICD-10-CM | POA: Diagnosis not present

## 2013-11-18 DIAGNOSIS — Z3202 Encounter for pregnancy test, result negative: Secondary | ICD-10-CM | POA: Insufficient documentation

## 2013-11-18 DIAGNOSIS — R1013 Epigastric pain: Secondary | ICD-10-CM | POA: Diagnosis present

## 2013-11-18 DIAGNOSIS — R112 Nausea with vomiting, unspecified: Secondary | ICD-10-CM | POA: Diagnosis not present

## 2013-11-18 DIAGNOSIS — Z880 Allergy status to penicillin: Secondary | ICD-10-CM | POA: Insufficient documentation

## 2013-11-18 LAB — URINALYSIS, ROUTINE W REFLEX MICROSCOPIC
Bilirubin Urine: NEGATIVE
Glucose, UA: NEGATIVE mg/dL
Hgb urine dipstick: NEGATIVE
Ketones, ur: 15 mg/dL — AB
Leukocytes, UA: NEGATIVE
Nitrite: NEGATIVE
Protein, ur: NEGATIVE mg/dL
Specific Gravity, Urine: 1.022 (ref 1.005–1.030)
Urobilinogen, UA: 0.2 mg/dL (ref 0.0–1.0)
pH: 6 (ref 5.0–8.0)

## 2013-11-18 LAB — CBC WITH DIFFERENTIAL/PLATELET
Basophils Absolute: 0 10*3/uL (ref 0.0–0.1)
Basophils Relative: 0 % (ref 0–1)
Eosinophils Absolute: 0.2 10*3/uL (ref 0.0–0.7)
Eosinophils Relative: 1 % (ref 0–5)
HCT: 39.8 % (ref 36.0–46.0)
Hemoglobin: 14 g/dL (ref 12.0–15.0)
Lymphocytes Relative: 29 % (ref 12–46)
Lymphs Abs: 4.6 10*3/uL — ABNORMAL HIGH (ref 0.7–4.0)
MCH: 29 pg (ref 26.0–34.0)
MCHC: 35.2 g/dL (ref 30.0–36.0)
MCV: 82.4 fL (ref 78.0–100.0)
Monocytes Absolute: 0.9 10*3/uL (ref 0.1–1.0)
Monocytes Relative: 6 % (ref 3–12)
Neutro Abs: 10.1 10*3/uL — ABNORMAL HIGH (ref 1.7–7.7)
Neutrophils Relative %: 64 % (ref 43–77)
Platelets: 229 10*3/uL (ref 150–400)
RBC: 4.83 MIL/uL (ref 3.87–5.11)
RDW: 13 % (ref 11.5–15.5)
WBC: 15.8 10*3/uL — ABNORMAL HIGH (ref 4.0–10.5)

## 2013-11-18 LAB — PREGNANCY, URINE: Preg Test, Ur: NEGATIVE

## 2013-11-18 LAB — BASIC METABOLIC PANEL
Anion gap: 12 (ref 5–15)
BUN: 6 mg/dL (ref 6–23)
CO2: 20 mEq/L (ref 19–32)
Calcium: 8.7 mg/dL (ref 8.4–10.5)
Chloride: 109 mEq/L (ref 96–112)
Creatinine, Ser: 0.65 mg/dL (ref 0.50–1.10)
GFR calc Af Amer: >90 mL/min (ref >90)
GFR calc non Af Amer: >90 mL/min (ref >90)
Glucose, Bld: 91 mg/dL (ref 70–99)
Potassium: 4.1 mEq/L (ref 3.7–5.3)
Sodium: 141 mEq/L (ref 137–147)

## 2013-11-18 LAB — LIPASE, BLOOD: Lipase: 13 U/L (ref 11–59)

## 2013-11-18 MED ORDER — ONDANSETRON 4 MG PO TBDP
8.0000 mg | ORAL_TABLET | Freq: Once | ORAL | Status: AC
  Administered 2013-11-18: 8 mg via ORAL
  Filled 2013-11-18: qty 2

## 2013-11-18 MED ORDER — SODIUM CHLORIDE 0.9 % IV BOLUS (SEPSIS)
1000.0000 mL | Freq: Once | INTRAVENOUS | Status: AC
  Administered 2013-11-18: 1000 mL via INTRAVENOUS

## 2013-11-18 MED ORDER — ONDANSETRON HCL 4 MG PO TABS: 4.0000 mg | ORAL_TABLET | Freq: Four times a day (QID) | ORAL | Status: DC

## 2013-11-18 NOTE — ED Notes (Signed)
Pt states she ate a fish sandwich and began vomiting shortly after. Per pt's friend pt began to have an anxiety attack after vomiting so much she called EMS pt refused to be transported initially. Pt c/o lower abd pain described as constant in nature. Pt rates pain 6/10. Pt reports she is also having rt knee pain as well as a headache.

## 2013-11-18 NOTE — ED Provider Notes (Signed)
CSN: [id number]     Arrival date & time 11/18/13  0228 History   First MD Initiated Contact with Patient 11/18/13 [phone]     Chief Complaint  Patient presents with  . Abdominal Pain  . Emesis     (Consider location/radiation/quality/duration/timing/severity/associated sxs/prior Treatment) HPI Deborah Gibbs is a 23 y.o. female who complains of emesis 4 times since 10:30. She says she ate a fish sandwich that she had never had before and threw up 15 minutes later. She said there "was some red stuff the first time I threw up", but since then it has been clear liquids. She complains of abd pain that she believes is from throwing up. The pain is worse when she rotates from side to side. The pain does not radiate and does not have any respiratory component. She believes she had a panic attack after she began throwing up and became SOB and had numbness and tingling in her fingers and toes.  No abd surgeries  Denies smoking, EtOH use, other illicit drug use. She denies fever, constipation, diarrhea, bloody stools. She also complains of L knee pain from when she fell down. She was able to immediatley weight bear and was able to walk in ED.  Past Medical History  Diagnosis Date  . Allergy   . Abortion    History reviewed. No pertinent past surgical history. Family History  Problem Relation Age of Onset  . Hypertension Father    History  Substance Use Topics  . Smoking status: Never Smoker   . Smokeless tobacco: Not on file  . Alcohol Use: No   OB History   Grav Para Term Preterm Abortions TAB SAB Ect Mult Living                 Review of Systems  Constitutional: Negative for fever.  HENT: Negative for trouble swallowing.   Eyes: Negative for visual disturbance.  Respiratory: Positive for shortness of breath.   Cardiovascular: Negative for palpitations.  Gastrointestinal: Positive for vomiting and abdominal pain. Negative for diarrhea, constipation and blood in stool.  Genitourinary:  Negative for menstrual problem.  Musculoskeletal: Negative for neck stiffness.  Skin: Negative for rash.  Neurological: Negative for weakness.      Allergies  Diphenhydramine hcl and Penicillins  Home Medications   Prior to Admission medications   Medication Sig Start Date End Date Taking? Authorizing Provider  ibuprofen (ADVIL,MOTRIN) 200 MG tablet Take 200-400 mg by mouth every 6 (six) hours as needed for fever, headache, mild pain or moderate pain.   Yes Historical Provider, MD  ondansetron (ZOFRAN) 4 MG tablet Take 1 tablet (4 mg total) by mouth every 6 (six) hours. 11/18/13   Viona Gilmore Chynah Orihuela, PA-C   BP 110/67  Pulse 80  Temp(Src) 98.5 F (36.9 C) (Oral)  Resp 16  SpO2 99% Physical Exam  Nursing note and vitals reviewed. Constitutional: She is oriented to person, place, and time. She appears well-developed and well-nourished.  HENT:  Head: Normocephalic and atraumatic.  Mouth/Throat: Oropharynx is clear and moist.  Eyes: Pupils are equal, round, and reactive to light. Right eye exhibits no discharge. Left eye exhibits no discharge.  Neck: Normal range of motion. Neck supple.  Cardiovascular: Normal rate, regular rhythm and normal heart sounds.   Pulmonary/Chest: Effort normal and breath sounds normal.  Abdominal: Soft. Bowel sounds are normal. She exhibits no distension and no mass. There is no rebound and no guarding.  Tenderness to epigastric area upon mild palpation.  Musculoskeletal: Normal range of motion.  Normal active and passive ROM of L knee with no appreciable swelling. Able to flex and extend against resistance. No ligamentous laxity or bony tenderness.  Neurological: She is alert and oriented to person, place, and time.  Skin: Skin is warm and dry. No rash noted.    ED Course  Procedures (including critical care time) Labs Review Labs Reviewed  URINALYSIS, ROUTINE W REFLEX MICROSCOPIC - Abnormal; Notable for the following:    APPearance HAZY (*)     Ketones, ur 15 (*)    All other components within normal limits  CBC WITH DIFFERENTIAL - Abnormal; Notable for the following:    WBC 15.8 (*)    Neutro Abs 10.1 (*)    Lymphs Abs 4.6 (*)    All other components within normal limits  PREGNANCY, URINE  LIPASE, BLOOD  BASIC METABOLIC PANEL    Imaging Review No results found.   EKG Interpretation None      MDM  Pt resting comfortably in ED, will bolus fluids and recheck vitals Nausea is controlled with zofran 8mg  Vitals stable- afebrile Labwork essentially normal- Leukocytosis nonspecific but likely due to periods of emesis. Is tolerating PO- Will encourage proper hydration maintenance  Knee pain likely contusion- not concerning for significant pathology at this time Will DC with ODT Zofran for Nausea Final diagnoses:  Non-intractable vomiting with nausea, vomiting of unspecified type   Meds given in ED:  Medications  ondansetron (ZOFRAN-ODT) disintegrating tablet 8 mg (8 mg Oral Given 11/18/13 0512)  sodium chloride 0.9 % bolus 1,000 mL (0 mLs Intravenous Stopped 11/18/13 0849)    New Prescriptions   ONDANSETRON (ZOFRAN) 4 MG TABLET    Take 1 tablet (4 mg total) by mouth every 6 (six) hours.   Prior to patient discharge, I discussed and reviewed this case with Dr.Yelverton        Verl Dicker, PA-C 11/18/13 [phone]

## 2013-11-18 NOTE — Discharge Instructions (Signed)
Nausea and Vomiting °Nausea is a sick feeling that often comes before throwing up (vomiting). Vomiting is a reflex where stomach contents come out of your mouth. Vomiting can cause severe loss of body fluids (dehydration). Children and elderly adults can become dehydrated quickly, especially if they also have diarrhea. Nausea and vomiting are symptoms of a condition or disease. It is important to find the cause of your symptoms. °CAUSES  °· Direct irritation of the stomach lining. This irritation can result from increased acid production (gastroesophageal reflux disease), infection, food poisoning, taking certain medicines (such as nonsteroidal anti-inflammatory drugs), alcohol use, or tobacco use. °· Signals from the brain. These signals could be caused by a headache, heat exposure, an inner ear disturbance, increased pressure in the brain from injury, infection, a tumor, or a concussion, pain, emotional stimulus, or metabolic problems. °· An obstruction in the gastrointestinal tract (bowel obstruction). °· Illnesses such as diabetes, hepatitis, gallbladder problems, appendicitis, kidney problems, cancer, sepsis, atypical symptoms of a heart attack, or eating disorders. °· Medical treatments such as chemotherapy and radiation. °· Receiving medicine that makes you sleep (general anesthetic) during surgery. °DIAGNOSIS °Your caregiver may ask for tests to be done if the problems do not improve after a few days. Tests may also be done if symptoms are severe or if the reason for the nausea and vomiting is not clear. Tests may include: °· Urine tests. °· Blood tests. °· Stool tests. °· Cultures (to look for evidence of infection). °· X-rays or other imaging studies. °Test results can help your caregiver make decisions about treatment or the need for additional tests. °TREATMENT °You need to stay well hydrated. Drink frequently but in small amounts. You may wish to drink water, sports drinks, clear broth, or eat frozen  ice pops or gelatin dessert to help stay hydrated. When you eat, eating slowly may help prevent nausea. There are also some antinausea medicines that may help prevent nausea. °HOME CARE INSTRUCTIONS  °· Take all medicine as directed by your caregiver. °· If you do not have an appetite, do not force yourself to eat. However, you must continue to drink fluids. °· If you have an appetite, eat a normal diet unless your caregiver tells you differently. °¨ Eat a variety of complex carbohydrates (rice, wheat, potatoes, bread), lean meats, yogurt, fruits, and vegetables. °¨ Avoid high-fat foods because they are more difficult to digest. °· Drink enough water and fluids to keep your urine clear or pale yellow. °· If you are dehydrated, ask your caregiver for specific rehydration instructions. Signs of dehydration may include: °¨ Severe thirst. °¨ Dry lips and mouth. °¨ Dizziness. °¨ Dark urine. °¨ Decreasing urine frequency and amount. °¨ Confusion. °¨ Rapid breathing or pulse. °SEEK IMMEDIATE MEDICAL CARE IF:  °· You have blood or brown flecks (like coffee grounds) in your vomit. °· You have black or bloody stools. °· You have a severe headache or stiff neck. °· You are confused. °· You have severe abdominal pain. °· You have chest pain or trouble breathing. °· You do not urinate at least once every 8 hours. °· You develop cold or clammy skin. °· You continue to vomit for longer than 24 to 48 hours. °· You have a fever. °MAKE SURE YOU:  °· Understand these instructions. °· Will watch your condition. °· Will get help right away if you are not doing well or get worse. °Document Released: 03/26/2005 Document Revised: 06/18/2011 Document Reviewed: 08/23/2010 °[employer]® Patient Information ©2015 [employer]. This information is not intended   to replace advice given to you by your health care provider. Make sure you discuss any questions you have with your health care provider.   Follow up with PCP as needed for symptom  management. Return to ED if symptoms get worse or do not get better after 7 days. Maintain hydration status.

## 2013-11-25 NOTE — ED Provider Notes (Signed)
Medical screening examination/treatment/procedure(s) were performed by non-physician practitioner and as supervising physician I was immediately available for consultation/collaboration.   EKG Interpretation None        Julianne Rice, MD 11/25/13 [phone]

## 2014-03-09 ENCOUNTER — Other Ambulatory Visit: Payer: Self-pay | Admitting: Orthopedic Surgery

## 2014-03-09 DIAGNOSIS — M25561 Pain in right knee: Secondary | ICD-10-CM

## 2014-03-12 ENCOUNTER — Other Ambulatory Visit: Payer: Self-pay

## 2015-03-24 ENCOUNTER — Encounter (HOSPITAL_COMMUNITY): Payer: Self-pay | Admitting: Neurology

## 2015-03-24 ENCOUNTER — Emergency Department (HOSPITAL_COMMUNITY)
Admission: EM | Admit: 2015-03-24 | Discharge: 2015-03-24 | Disposition: A | Discharge status: Home / Self Care | Payer: Self-pay | Attending: Emergency Medicine | Admitting: Emergency Medicine

## 2015-03-24 DIAGNOSIS — H538 Other visual disturbances: Secondary | ICD-10-CM | POA: Insufficient documentation

## 2015-03-24 DIAGNOSIS — T7840XA Allergy, unspecified, initial encounter: Secondary | ICD-10-CM | POA: Insufficient documentation

## 2015-03-24 DIAGNOSIS — Z880 Allergy status to penicillin: Secondary | ICD-10-CM | POA: Insufficient documentation

## 2015-03-24 DIAGNOSIS — T39395A Adverse effect of other nonsteroidal anti-inflammatory drugs [NSAID], initial encounter: Secondary | ICD-10-CM | POA: Insufficient documentation

## 2015-03-24 DIAGNOSIS — R1310 Dysphagia, unspecified: Secondary | ICD-10-CM | POA: Insufficient documentation

## 2015-03-24 MED ORDER — METHYLPREDNISOLONE SODIUM SUCC 125 MG IJ SOLR
125.0000 mg | Freq: Once | INTRAMUSCULAR | Status: AC
Start: 2015-03-24 — End: 2015-03-24
  Administered 2015-03-24: 125 mg via INTRAVENOUS
  Filled 2015-03-24: qty 2

## 2015-03-24 MED ORDER — FAMOTIDINE 20 MG PO TABS: 20.0000 mg | ORAL_TABLET | Freq: Two times a day (BID) | ORAL | Status: DC

## 2015-03-24 MED ORDER — PREDNISONE 20 MG PO TABS: 40.0000 mg | ORAL_TABLET | Freq: Every day | ORAL | Status: DC

## 2015-03-24 MED ORDER — FAMOTIDINE IN NACL 20-0.9 MG/50ML-% IV SOLN
20.0000 mg | Freq: Once | INTRAVENOUS | Status: AC
  Administered 2015-03-24: 20 mg via INTRAVENOUS
  Filled 2015-03-24: qty 50

## 2015-03-24 NOTE — ED Provider Notes (Signed)
CSN: [id number]     Arrival date & time 03/24/15  1117 History   First MD Initiated Contact with Patient 03/24/15 1137     Chief Complaint  Patient presents with  . Allergic Reaction     (Consider location/radiation/quality/duration/timing/severity/associated sxs/prior Treatment) The history is provided by the patient and medical records. No language interpreter was used.    Deborah Gibbs is a 24 y.o. female  who presents to the Emergency Department for an allergic reaction. Patient states her knee was bothering her, so she took Meloxicam 7.5 mg at approx. 10:30 this morning. She immediately noticed tingling, blurry vision, and facial swelling. Patient states that she noticed difficulty with swallowing upon arrival to ED which was 11:17 am. Patient has never taken Meloxicam in the past. Pt. States she had similar sxs with an allergic reaction to Benadryl, however she also had an associated rash. Denies rash today. No medications taken PTA.   Past Medical History  Diagnosis Date  . Allergy   . Abortion    No past surgical history on file. Family History  Problem Relation Age of Onset  . Hypertension Father    Social History  Substance Use Topics  . Smoking status: Never Smoker   . Smokeless tobacco: None  . Alcohol Use: No   OB History    No data available     Review of Systems  Constitutional: Negative.   HENT: Positive for facial swelling and trouble swallowing. Negative for congestion and rhinorrhea.   Eyes: Negative for visual disturbance.  Respiratory: Negative for cough, wheezing and stridor.   Cardiovascular: Negative.   Gastrointestinal: Negative for nausea, vomiting, abdominal pain, diarrhea and constipation.  Musculoskeletal: Negative for myalgias, back pain, arthralgias and neck pain.  Skin: Negative for rash.  Neurological: Negative for dizziness, weakness and headaches.  Hematological: Does not bruise/bleed easily.      Allergies  Diphenhydramine hcl;  Meloxicam; and Penicillins  Home Medications   Prior to Admission medications   Medication Sig Start Date End Date Taking? Authorizing Provider  famotidine (PEPCID) 20 MG tablet Take 1 tablet (20 mg total) by mouth 2 (two) times daily. 03/24/15   Ozella Almond Arwen Haseley, PA-C  ondansetron (ZOFRAN) 4 MG tablet Take 1 tablet (4 mg total) by mouth every 6 (six) hours. Patient not taking: Reported on 03/24/2015 11/18/13   Comer Locket, PA-C  predniSONE (DELTASONE) 20 MG tablet Take 2 tablets (40 mg total) by mouth daily. 03/24/15   Nikia Mangino Pilcher Admire Bunnell, PA-C   BP 91/56 mmHg  Pulse 62  Temp(Src) 98.4 F (36.9 C) (Oral)  Resp 14  SpO2 100%  LMP 03/10/2015 Physical Exam  Constitutional: She is oriented to person, place, and time. She appears well-developed and well-nourished.  Alert, speaking in full sentences, and in no acute distress  HENT:  Head: Normocephalic and atraumatic.  Mouth/Throat: Uvula is midline, oropharynx is clear and moist and mucous membranes are normal.  Mild facial swelling. Airway patent.   Eyes: EOM are normal. Pupils are equal, round, and reactive to light.  Neck: Normal range of motion.    Cardiovascular: Normal rate, regular rhythm, normal heart sounds and intact distal pulses.  Exam reveals no gallop and no friction rub.   No murmur heard. Pulmonary/Chest: Effort normal and breath sounds normal. No stridor. No respiratory distress. She has no wheezes. She has no rales. She exhibits no tenderness.  Abdominal: She exhibits no mass. There is no rebound and no guarding.  Abdomen soft, non-tender, non-distended Bowel  sounds positive in all four quadrants  Musculoskeletal: She exhibits no edema.  Neurological: She is alert and oriented to person, place, and time.  Skin: Skin is warm and dry. No rash noted.  Nursing note and vitals reviewed.   ED Course  Procedures (including critical care time) Labs Review Labs Reviewed - No data to display  Imaging Review No  results found. I have personally reviewed and evaluated these images and lab results as part of my medical decision-making.   EKG Interpretation None      MDM   Final diagnoses:  Allergic reaction, initial encounter   MARIEM CRUTE presents with facial swelling and blurred vision likely 2/2 meloxicam allergy. Airway patent, 98% on O2, lungs are clear.  Consults: Due to Benadryl allergy, pharmacy was consulted for treatment recommendations.  3:10 PM - Patient re-evaluated and feels much improved.   A&P: Allergic reaction  - Pepcid and solumedrol given in ED  - Discharge to home in good and stable condition with Prednisone 40mg  x 3 days and pepcid.   - PCP follow up; return precautions and home care instructions given - all questions answered.   Patient seen by and discussed with Dr. Ralene Bathe who agrees with treatment plan.   [facility] Jacobus Colvin, PA-C 03/24/15 1511  Quintella Reichert, MD 03/24/15 1800

## 2015-03-24 NOTE — Discharge Instructions (Signed)
Read instructions below to learn more about your diagnosis and for reasons to return to the ED. Follow up with your doctor if symptoms are not improving after 3-4 days. If you do not have a doctor use the resource guide listed below to help he find one. You may return to the emergency department if symptoms worsen, become progressive, or become more concerning.  Allergic Reaction  There are many allergens around [place]. It may be difficult to know what caused your reaction. You may follow up with an allergy specialist for further testing to learn more about your specific allergies.   TREATMENT AND HOME CARE INSTRUCTIONS  If hives or rash are present:  Use an over-the-counter antihistamine (Benadryl or Zyrtec) for hives and itching as needed. Do not drive or drink alcohol until medications used to treat the reaction have worn off. Antihistamines tend to make people sleepy.  Apply cold cloths (compresses) to the skin or take baths in cool water. This will help itching. Avoid hot baths or showers. Heat will make a rash and itching worse.  See Your Primary Care Doctor if:  Your allergies are becoming progressively more troublesome.  You suspect a food allergy. Symptoms generally happen within 30 minutes of eating a food.  Your symptoms have not gone away within 2 days.  SEEK IMMEDIATE MEDICAL CARE IF:  You develop difficulty breathing or wheezing, or have a tight feeling in your chest or throat (feeling like your throat is closing) You develop swollen lips or tongue You develop hives on your chest, neck or face. You are unable to swallow fluids or salvia secretions.   A severe reaction with any of the above problems should be considered life-threatening. If you suddenly develop difficulty breathing call for local emergency medical help. THIS IS AN EMERGENCY.    Emergency Department Resource Guide 1) Find a Doctor and Pay Out of Pocket Although you won't have to find out who is covered by your  insurance plan, it is a good idea to ask around and get recommendations. You will then need to call the office and see if the doctor you have chosen will accept you as a new patient and what types of options they offer for patients who are self-pay. Some doctors offer discounts or will set up payment plans for their patients who do not have insurance, but you will need to ask so you aren't surprised when you get to your appointment.  2) Contact Your Local Health Department Not all health departments have doctors that can see patients for sick visits, but many do, so it is worth a call to see if yours does. If you don't know where your local health department is, you can check in your phone book. The CDC also has a tool to help you locate your state's health department, and many state websites also have listings of all of their local health departments.  3) Find a [facility] If your illness is not likely to be very severe or complicated, you may want to try a walk in clinic. These are popping up all over the country in pharmacies, drugstores, and shopping centers. They're usually staffed by nurse practitioners or physician assistants that have been trained to treat common illnesses and complaints. They're usually fairly quick and inexpensive. However, if you have serious medical issues or chronic medical problems, these are probably not your best option.  No Primary Care Doctor: - Call Health Connect at  [phone] - they can help you locate  a primary care doctor that  accepts your insurance, provides certain services, etc. - Physician Referral Service- [phone]  Chronic Pain Problems: Organization         Address  Phone   Notes  [facility]  [phone] Patients need to be referred by their primary care doctor.   Medication Assistance: Organization         Address  Phone   Notes  [facility] Medication [facility] [address] [place], [place] [address] [phone] --Must be a resident of [facility] -- Must have NO insurance coverage whatsoever (no Medicaid/ Medicare, etc.) -- The pt. MUST have a primary care doctor that directs their care regularly and follows them in the community   MedAssist  [phone]   [employer]  [phone]    Agencies that provide inexpensive medical care: Organization         Address  Phone   Notes  [place]  [phone]   [facility]    [phone]   [facility] [place], [place] [address] [phone]   [place] [address] [place] [phone]   Planned Parenthood    [phone]   [facility]    [phone]   [place] and [place] [address], [place] Phone:  [phone], Fax:  [phone] Hours of Operation:  9 am - 6 pm, M-F.  Also accepts Medicaid/Medicare and self-pay.  [facility] for [place], [address], [place] Phone: [phone], Fax: [phone]. Hours of Operation:  8:30 am - 5:30 pm, M-F.  Also accepts Medicaid and self-pay.  [facility] [address], [place] Phone: [phone]   [place], [place], [place] [phone], Ext. [phone] Mondays & Thursdays: 7-9 AM.  First 15 patients are seen on a first come, first serve basis.    [place] Providers:  Organization         Address  Phone   Notes  [facility] [address], [place] [phone] Also accepts self-pay patients.  [facility] [id number] [place], [place]  [phone]   [place], [address], [place] [phone]   [facility] [address] [place] [phone]   Lucianne Lei [address], [place]   [phone] Only accepts [place] Access [place] patients after they have their name applied to their card.   Self-Pay (no insurance) in [facility]:  Organization         Address  Phone   Notes  Sickle Cell Patients, [facility] [place] [phone]   [facility] [place] [phone]   [facility] [place]  [place], [place], [place] [phone]   [facility]/Dr. Osei-Bonsu  [address] [place] or [address], [place] [phone] Phone number for both [place] and [place] locations is the same.  [facility] [address] [place] [phone]   [facility] [address] [place] or [address] [phone] (  [phone]   [place] [phone], phone; [phone], fax Sees patients 1st and 3rd Saturday of every month.  Must not qualify for public or private insurance (i.e. Medicaid, Medicare, [place] Health Choice, Veterans' Benefits)  Household income should be no more than 200% of the poverty level The clinic cannot treat you if you are pregnant or think you are pregnant  Sexually transmitted diseases are not treated at the clinic.    Dental Care: Organization         Address  Phone  Notes  [facility] Department of [facility] [place] [phone] Accepts children up to age 51 who are enrolled in [place] or [place]; pregnant women with a Medicaid card; and children who have applied for Medicaid or [place] Health Choice, but were declined, whose parents can pay a reduced fee at time of service.  [facility] Department of [facility]  [address], [place] [phone] Accepts children up to age 21 who are enrolled  in [place] or [place]; pregnant women with a Medicaid card; and children who have applied for Medicaid or [place] Health Choice, but were declined, whose parents can pay a reduced fee at time of service.  [place] Adult Dental Access PROGRAM  [place] [phone] Patients are seen by appointment only. Walk-ins are not accepted. [place] will see patients 39 years of age and older. Monday - Tuesday (8am-5pm) Most Wednesdays (8:30-5pm) $30 per visit, cash only  [facility] Adult Dental Access PROGRAM  [address], [facility] [phone] Patients are seen by appointment only. Walk-ins are not accepted. [place] will see patients 72 years of age and older. One Wednesday Evening (Monthly: Volunteer Based).  $30 per visit, cash only  [place]  [phone] for adults; Children under age 87, call Graduate Pediatric Dentistry at [phone]. Children aged 20-14, please call [phone] to request a pediatric application.  Dental services are provided in all areas of dental care including fillings, crowns and bridges, complete and partial dentures, implants, gum treatment, root canals, and extractions. Preventive care is also provided. Treatment is provided to both adults and children. Patients are selected via a lottery and there is often a waiting list.   [facility] [address], [place]  [phone] [email or web address]   [facility] [address] [place], [place] [phone], Ext. [phone] Second and Fourth Thursday of each month, opens at 6:30 AM; Clinic ends at 9 AM.  Patients are seen on a first-come first-served basis, and a limited number are seen during each clinic.   [facility]  [address] Hillard Danker [place], [place] [phone]   Eligibility Requirements You must have lived in [place], [place], or [place] counties for at least the last three months.   You cannot be  eligible for state or federal sponsored [employer], including [employer], [place], or [employer].   You generally cannot be eligible for healthcare insurance through your employer.    How to apply: Eligibility screenings are held every Tuesday and Wednesday afternoon from 1:00 pm until 4:00 pm. You do not need an appointment for the interview!  [facility] [address] [place], [place]   [place]  [phone]   [place] Department  [phone]  [place] in the Community: Intensive Outpatient Programs Organization         Address  Phone  Notes  [place]. [address] [place], [place] [phone]   [facility] Outpatient [address] [place], [place]   ADS: Alcohol & Drug Svcs [address] [place], [place]   [place] [address]  [place], [place] or [phone]   Substance Abuse Resources Organization         Address  Phone  Notes  Alcohol and Drug Services  [phone]   [place]  [phone]   The [place]   Chinita Pester  [phone]   Residential & Outpatient Substance Abuse Program  [phone]   Psychological Services Organization         Address  Phone  Notes  [facility] [place]  [place]  [phone]   [place] [address] [place] or [phone]    Mobile Crisis Teams Organization         Address  Phone  Notes  [facility], Mobile Crisis Care Unit  [phone]   [facility]ive Psychotherapeutic Services  [address] [place], [place]   [place] Levels [address], [place] [phone]    Self-Help/Support Groups Organization          Address  Phone             Notes  [place]. of [place] - variety of support groups  [place] Call for more information  Narcotics Anonymous (NA), [facility] [address], [employer] [place]  2 meetings at this location   Special educational needs teacher         Address  Phone  Notes  ASAP Residential Treatment [place],    [place]  [phone]   [facility]  [address], [place] [phone], [place], [place]   [place], [place] [phone] Admissions: 8am-3pm M-F  Incentives Substance [place] [address],    [place], [place] [phone]   [facility] [address] [place], [place], [place]   [facility] [address],  [place], [place]   [facility]ight Programs - Intensive Outpatient [address] Kristeen [address], [place], [place]   [facility] ([place] [address].,  [place], [place] [phone] or [phone]   [facility] Services (RTS) [address] [place], [place] Accepts Medicaid  [place] [address]  [place] [phone] Substance Abuse/Addiction Treatment   [facility] Organization         Address  Phone  Notes  [facility]  [phone]   Domenic Schwab, PhD [address] Arlis Porta [place], [place]   [phone] or [phone]   [place], [place] [phone]   [place] [address], [place], [place] [phone] Insurance/Medicaid/sponsorship through [employer] and Families [address] [place]                                    [place], [place] [phone] Therapy/tele-psych/case  [facility] [place], [place] [phone]    Dr. Adele Schilder  [phone]   [facility] [place] Dept. 1) [address] [place] 2) [place] 3)  [place] [address], [place] [phone]  [phone]   [place] [phone] or [phone] (After Hours)

## 2015-03-24 NOTE — ED Notes (Signed)
Pt reports took meloxicam 7.5 mg tab at 1030 developed itching to her eyes and swelling. Also had some blurry vision which has remained. A x 4. In NAD

## 2015-04-10 NOTE — L&D Delivery Note (Signed)
Patient is 25 y.o. G2P0010 [redacted]w[redacted]d admitted for SROM. Mother treated for Triple I during labor with Vancomycin and Gentamicin.    Delivery Note At 9:11 AM a viable female was delivered via  (Presentation: Occiput Anterior ).  APGAR:9 ,9 ; weight pending.   Placenta status: spontaneous and intact.  Cord:  3 vessel with the following complications: loose nuchal, delivered through.   Anesthesia:  Epidural  Episiotomy:  None  Lacerations:  Bilateral labial tears, hemostatic and not requiring repair  Suture Repair: none Est. Blood Loss (mL):  100 cc  Mom to postpartum.  Baby to Couplet care / Skin to Skin.  Melina Schools 12/05/2015, 9:32 AM  OB FELLOW DELIVERY ATTESTATION  I was gloved and present for the delivery in its entirety, and I agree with the above resident's note.    Jacquiline Doe, MD 9:47 AM

## 2015-04-13 ENCOUNTER — Encounter (HOSPITAL_COMMUNITY): Payer: Self-pay | Admitting: *Deleted

## 2015-04-13 ENCOUNTER — Inpatient Hospital Stay (HOSPITAL_COMMUNITY): Payer: 59

## 2015-04-13 ENCOUNTER — Inpatient Hospital Stay (HOSPITAL_COMMUNITY)
Admission: AD | Admit: 2015-04-13 | Discharge: 2015-04-13 | Disposition: A | Discharge status: Home / Self Care | Payer: 59 | Source: Ambulatory Visit | Attending: Obstetrics & Gynecology | Admitting: Obstetrics & Gynecology

## 2015-04-13 DIAGNOSIS — R102 Pelvic and perineal pain: Secondary | ICD-10-CM | POA: Diagnosis present

## 2015-04-13 DIAGNOSIS — R109 Unspecified abdominal pain: Secondary | ICD-10-CM

## 2015-04-13 HISTORY — DX: Benign neoplasm of connective and other soft tissue, unspecified: D21.9

## 2015-04-13 LAB — URINALYSIS, ROUTINE W REFLEX MICROSCOPIC
Bilirubin Urine: NEGATIVE
Glucose, UA: NEGATIVE mg/dL
Hgb urine dipstick: NEGATIVE
Ketones, ur: NEGATIVE mg/dL
Leukocytes, UA: NEGATIVE
Nitrite: NEGATIVE
Protein, ur: NEGATIVE mg/dL
Specific Gravity, Urine: >1.03 — ABNORMAL HIGH (ref 1.005–1.030)
pH: 5.5 (ref 5.0–8.0)

## 2015-04-13 LAB — HCG, QUANTITATIVE, PREGNANCY: hCG, Beta Chain, Quant, S: 1555 m[IU]/mL — ABNORMAL HIGH (ref <5)

## 2015-04-13 LAB — POCT PREGNANCY, URINE: Preg Test, Ur: POSITIVE — AB

## 2015-04-13 NOTE — Progress Notes (Signed)
Patient states she is tired of waiting and wants to go home. Informed patient that call has been made to provider. Patient insists on leaving. Will F/U in office for results and prenatal care.

## 2015-04-13 NOTE — MAU Note (Signed)
Patient is a current patient of Chemical [employer].

## 2015-04-13 NOTE — Progress Notes (Signed)
TC from RN-pt signed out AMA, could not wait for provider evaluation and sono results.  Review of chart: G2P0010 @[redacted]w[redacted]d  by LMP c/o LAP  VS:  98.4  98.4 (36.9)  01/04 1132      Pulse Rate   74  74  01/04 1132   Resp   18  18  01/04 1132   BP   113/64  113/64  01/04 1132   SpO2 (%)   100           Quant HCG: 1555 SONO: CLINICAL DATA: Pregnancy. Pelvic pain.  EXAM: OBSTETRIC <14 WK [place] AND TRANSVAGINAL OB US  TECHNIQUE: Both transabdominal and transvaginal ultrasound examinations were performed for complete evaluation of the gestation as well as the maternal uterus, adnexal regions, and pelvic cul-de-sac. Transvaginal technique was performed to assess early pregnancy.  COMPARISON: None.  FINDINGS: Intrauterine gestational sac: Single  Yolk sac: Possible small  Embryo: No  Cardiac Activity: No  Heart Rate: Not applicable bpm  MSD: 3.5 mm 5 w 1 d  Subchorionic hemorrhage: None visualized.  Maternal uterus/adnexae:  Subchorionic hemorrhage: None  Right ovary: Normal  Left ovary: Normal  Other :None  Free fluid: None  IMPRESSION: 1. Probable early intrauterine gestational sac with possible small yolk sac, but no fetal pole, or cardiac activity yet visualized. Recommend follow-up quantitative B-HCG levels and follow-up US in 14 days to confirm and assess viability. This recommendation follows SRU consensus guidelines: Diagnostic Criteria for Nonviable Pregnancy Early in the First Trimester. [place] Med [id number]:048977.   Electronically Signed  By: Kerby Moors M.D.  On: 04/13/2015 16:33  A/P: Early pregnancy Repeat sono 1-2 weeks Per RN pt will call the office for f/u

## 2015-04-13 NOTE — MAU Note (Signed)
Pain in lower abd, started about a wk ago.  Last Spring was told she had " lumps on her ovaries", wanting to see if that is what is causing the pain

## 2015-04-13 NOTE — MAU Note (Signed)
Patient had an appointment at [facility] at 1030 this morning. States she was an hour late so she came to MAU instead. Explained to patient that CNM from [employer] has been contacted and has placed orders for lab and U/S. Advised patient to keep appts and arrive on time at the office where she will receive her prenatal care.

## 2015-04-21 ENCOUNTER — Emergency Department (INDEPENDENT_AMBULATORY_CARE_PROVIDER_SITE_OTHER)
Admission: EM | Admit: 2015-04-21 | Discharge: 2015-04-21 | Disposition: A | Discharge status: Home / Self Care | Payer: 59 | Source: Home / Self Care | Attending: Family Medicine | Admitting: Family Medicine

## 2015-04-21 ENCOUNTER — Encounter (HOSPITAL_COMMUNITY): Payer: Self-pay

## 2015-04-21 DIAGNOSIS — K029 Dental caries, unspecified: Secondary | ICD-10-CM

## 2015-04-21 MED ORDER — CLINDAMYCIN HCL 150 MG PO CAPS: 150.0000 mg | ORAL_CAPSULE | Freq: Four times a day (QID) | ORAL | Status: DC

## 2015-04-21 NOTE — ED Provider Notes (Signed)
CSN: [id number]     Arrival date & time 04/21/15  1754 History   First MD Initiated Contact with Patient 04/21/15 1928     Chief Complaint  Patient presents with  . Dental Pain   (Consider location/radiation/quality/duration/timing/severity/associated sxs/prior Treatment) HPI Pt was seen by dentist but discovered she was [redacted] weeks pregnant, was advised to come to UC for treatment. States she has been using ibuprofen for pain.  Past Medical History  Diagnosis Date  . Allergy   . Abortion   . Fibroid    Past Surgical History  Procedure Laterality Date  . Wisdom tooth extraction     Family History  Problem Relation Age of Onset  . Hypertension Father    Social History  Substance Use Topics  . Smoking status: Never Smoker   . Smokeless tobacco: None  . Alcohol Use: Yes     Comment: 1 X month, socially.   OB History    Gravida Para Term Preterm AB TAB SAB Ectopic Multiple Living   2    1 1     0     Review of Systems  ROS +'ve dental pain  Denies: HEADACHE, NAUSEA, ABDOMINAL PAIN, CHEST PAIN, CONGESTION, DYSURIA, SHORTNESS OF BREATH     Allergies  Diphenhydramine hcl; Meloxicam; and Penicillins  Home Medications   Prior to Admission medications   Medication Sig Start Date End Date Taking? Authorizing Provider  clindamycin (CLEOCIN) 150 MG capsule Take 1 capsule (150 mg total) by mouth every 6 (six) hours. 04/21/15   Konrad Felix, PA  famotidine (PEPCID) 20 MG tablet Take 1 tablet (20 mg total) by mouth 2 (two) times daily. Patient not taking: Reported on 04/13/2015 03/24/15   [facility] Ward, PA-C  ondansetron (ZOFRAN) 4 MG tablet Take 1 tablet (4 mg total) by mouth every 6 (six) hours. Patient not taking: Reported on 03/24/2015 11/18/13   Comer Locket, PA-C  predniSONE (DELTASONE) 20 MG tablet Take 2 tablets (40 mg total) by mouth daily. Patient not taking: Reported on 04/13/2015 03/24/15   Ozella Almond Ward, PA-C   Meds Ordered and Administered this Visit    Medications - No data to display  BP 125/84 mmHg  Pulse 78  Temp(Src) 98.1 F (36.7 C) (Oral)  Resp 17  SpO2 100%  LMP 03/10/2015 No data found.   Physical Exam  Constitutional: She appears well-developed and well-nourished. No distress.  HENT:  Head: Normocephalic and atraumatic.  Mouth/Throat:    Nursing note and vitals reviewed.   ED Course  Procedures (including critical care time)  Labs Review Labs Reviewed - No data to display  Imaging Review No results found.   Visual Acuity Review  Right Eye Distance:   Left Eye Distance:   Bilateral Distance:    Right Eye Near:   Left Eye Near:    Bilateral Near:         MDM   1. Dental caries   Patient is advised to continue home symptomatic treatment. Prescription for clindamicin  sent pharmacy patient has indicated. Patient is advised that if there are new or worsening symptoms or attend the emergency department, or contact primary care provider. Instructions of care provided discharged home in stable condition. Pt is advised to limit analgesia to tylenol.   THIS NOTE WAS GENERATED USING A VOICE RECOGNITION SOFTWARE PROGRAM. ALL REASONABLE EFFORTS  WERE MADE TO PROOFREAD THIS DOCUMENT FOR ACCURACY.     Konrad Felix, PA 04/21/15 1946

## 2015-04-21 NOTE — ED Notes (Signed)
Patient complains of right sided tooth pain for the past couple of days Patient is having pain from her jaw to her ear Patient did state she is six weeks pregnant and did not know she was not supposed to Be taking ibuprofen

## 2015-04-21 NOTE — Discharge Instructions (Signed)
Dental Caries Dental caries is tooth decay. This decay can cause a hole in teeth (cavity) that can get bigger and deeper over time. HOME CARE  Brush and floss your teeth. Do this at least two times a day.  Use a fluoride toothpaste.  Use a mouth rinse if told by your dentist or doctor.  Eat less sugary and starchy foods. Drink less sugary drinks.  Avoid snacking often on sugary and starchy foods. Avoid sipping often on sugary drinks.  Keep regular checkups and cleanings with your dentist.  Use fluoride supplements if told by your dentist or doctor.  Allow fluoride to be applied to teeth if told by your dentist or doctor.   This information is not intended to replace advice given to you by your health care provider. Make sure you discuss any questions you have with your health care provider.   Document Released: 01/03/2008 Document Revised: 04/16/2014 Document Reviewed: 03/28/2012 Elsevier Interactive Patient Education 2016 [place] and Dentist Visits Dental care supports good overall health. Regular dental visits can also help you avoid dental pain, bleeding, infection, and other more serious health problems in the future. It is important to keep the mouth healthy because diseases in the teeth, gums, and other oral tissues can spread to other areas of the body. Some problems, such as diabetes, heart disease, and pre-term labor have been associated with poor oral health.  See your dentist every 6 months. If you experience emergency problems such as a toothache or broken tooth, go to the dentist right away. If you see your dentist regularly, you may catch problems early. It is easier to be treated for problems in the early stages.  WHAT TO EXPECT AT A DENTIST VISIT  Your dentist will look for many common oral health problems and recommend proper treatment. At your regular dental visit, you can expect:  Gentle cleaning of the teeth and gums. This includes scraping and  polishing. This helps to remove the sticky substance around the teeth and gums (plaque). Plaque forms in the mouth shortly after eating. Over time, plaque hardens on the teeth as tartar. If tartar is not removed regularly, it can cause problems. Cleaning also helps remove stains.  Periodic X-rays. These pictures of the teeth and supporting bone will help your dentist assess the health of your teeth.  Periodic fluoride treatments. Fluoride is a natural mineral shown to help strengthen teeth. Fluoride treatmentinvolves applying a fluoride gel or varnish to the teeth. It is most commonly done in children.  Examination of the mouth, tongue, jaws, teeth, and gums to look for any oral health problems, such as:  Cavities (dental caries). This is decay on the tooth caused by plaque, sugar, and acid in the mouth. It is best to catch a cavity when it is small.  Inflammation of the gums caused by plaque buildup (gingivitis).  Problems with the mouth or malformed or misaligned teeth.  Oral cancer or other diseases of the soft tissues or jaws. KEEP YOUR TEETH AND GUMS HEALTHY For healthy teeth and gums, follow these general guidelines as well as your dentist's specific advice:  Have your teeth professionally cleaned at the dentist every 6 months.  Brush twice daily with a fluoride toothpaste.  Floss your teeth daily.  Ask your dentist if you need fluoride supplements, treatments, or fluoride toothpaste.  Eat a healthy diet. Reduce foods and drinks with added sugar.  Avoid smoking. TREATMENT FOR ORAL HEALTH PROBLEMS If you have oral health  problems, treatment varies depending on the conditions present in your teeth and gums.  Your caregiver will most likely recommend good oral hygiene at each visit.  For cavities, gingivitis, or other oral health disease, your caregiver will perform a procedure to treat the problem. This is typically done at a separate appointment. Sometimes your caregiver  will refer you to another dental specialist for specific tooth problems or for surgery. SEEK IMMEDIATE DENTAL CARE IF:  You have pain, bleeding, or soreness in the gum, tooth, jaw, or mouth area.  A permanent tooth becomes loose or separated from the gum socket.  You experience a blow or injury to the mouth or jaw area.   This information is not intended to replace advice given to you by your health care provider. Make sure you discuss any questions you have with your health care provider.   Document Released: 12/06/2010 Document Revised: 06/18/2011 Document Reviewed: 12/06/2010 Elsevier Interactive Patient Education [employer].

## 2015-05-01 ENCOUNTER — Emergency Department (INDEPENDENT_AMBULATORY_CARE_PROVIDER_SITE_OTHER)
Admission: EM | Admit: 2015-05-01 | Discharge: 2015-05-01 | Disposition: A | Discharge status: Home / Self Care | Payer: 59 | Source: Home / Self Care | Attending: Family Medicine | Admitting: Family Medicine

## 2015-05-01 ENCOUNTER — Encounter (HOSPITAL_COMMUNITY): Payer: Self-pay | Admitting: Emergency Medicine

## 2015-05-01 DIAGNOSIS — K047 Periapical abscess without sinus: Secondary | ICD-10-CM

## 2015-05-01 MED ORDER — CLINDAMYCIN HCL 300 MG PO CAPS: 150.0000 mg | ORAL_CAPSULE | Freq: Three times a day (TID) | ORAL | Status: DC

## 2015-05-01 NOTE — Discharge Instructions (Signed)

## 2015-05-01 NOTE — ED Notes (Signed)
Pt returns today with continued right mouth pain radiating to jaw area Seen here 1/12 and treated with ATB's, states pain continued with taking medication Pt is [redacted] weeks pregnant, told by [facility] to come here for another for different atb, if no relief they will arrange tooth extraction

## 2015-05-01 NOTE — ED Provider Notes (Signed)
CSN: [id number]     Arrival date & time 05/01/15  1442 History   First MD Initiated Contact with Patient 05/01/15 1610     Chief Complaint  Patient presents with  . Dental Pain   (Consider location/radiation/quality/duration/timing/severity/associated sxs/prior Treatment) HPI Continued dental pain right lower jaw. unsuccessful finding that would pull tooth while pregnant.  [facility] for different antibx.  Past Medical History  Diagnosis Date  . Allergy   . Abortion   . Fibroid    Past Surgical History  Procedure Laterality Date  . Wisdom tooth extraction     Family History  Problem Relation Age of Onset  . Hypertension Father    Social History  Substance Use Topics  . Smoking status: Never Smoker   . Smokeless tobacco: None  . Alcohol Use: Yes     Comment: 1 X month, socially.   OB History    Gravida Para Term Preterm AB TAB SAB Ectopic Multiple Living   2    1 1     0     Review of Systems ROS +'ve dental pain  Denies: HEADACHE, NAUSEA, ABDOMINAL PAIN, CHEST PAIN, CONGESTION, DYSURIA, SHORTNESS OF BREATH  Allergies  Diphenhydramine hcl; Meloxicam; and Penicillins  Home Medications   Prior to Admission medications   Medication Sig Start Date End Date Taking? Authorizing Provider  clindamycin (CLEOCIN) 150 MG capsule Take 1 capsule (150 mg total) by mouth every 6 (six) hours. 04/21/15   Konrad Felix, PA  clindamycin (CLEOCIN) 300 MG capsule Take 1 capsule (300 mg total) by mouth 3 (three) times daily. 05/01/15   Konrad Felix, PA  famotidine (PEPCID) 20 MG tablet Take 1 tablet (20 mg total) by mouth 2 (two) times daily. Patient not taking: Reported on 04/13/2015 03/24/15   [facility] Ward, PA-C  ondansetron (ZOFRAN) 4 MG tablet Take 1 tablet (4 mg total) by mouth every 6 (six) hours. Patient not taking: Reported on 03/24/2015 11/18/13   Comer Locket, PA-C  predniSONE (DELTASONE) 20 MG tablet Take 2 tablets (40  mg total) by mouth daily. Patient not taking: Reported on 04/13/2015 03/24/15   Ozella Almond Ward, PA-C   Meds Ordered and Administered this Visit  Medications - No data to display  BP 111/62 mmHg  Pulse 80  Temp(Src) 97.8 F (36.6 C) (Oral)  Resp 18  SpO2 98%  LMP 03/10/2015 No data found.   Physical Exam  Constitutional: She appears well-nourished.  HENT:  Head: Normocephalic and atraumatic.  Mouth/Throat: Oropharynx is clear and moist and mucous membranes are normal. Dental caries present.    Pulmonary/Chest: Effort normal.    ED Course  Procedures (including critical care time)  Labs Review Labs Reviewed - No data to display  Imaging Review No results found.   Visual Acuity Review  Right Eye Distance:   Left Eye Distance:   Bilateral Distance:    Right Eye Near:   Left Eye Near:    Bilateral Near:         MDM   1. Dental infection    Pt has allergy to PCN. Therefore, clindamycin drug of choice, will increase dose to 300 mg 3 times a day.  Follow up with OB/GYN as needed.  Advised to use ora gel.     Konrad Felix, PA 05/01/15 [phone]

## 2015-06-07 ENCOUNTER — Encounter: Payer: Self-pay | Admitting: *Deleted

## 2015-06-07 DIAGNOSIS — Z3490 Encounter for supervision of normal pregnancy, unspecified, unspecified trimester: Secondary | ICD-10-CM

## 2015-06-09 ENCOUNTER — Encounter: Payer: Self-pay | Admitting: *Deleted

## 2015-06-27 ENCOUNTER — Telehealth: Payer: Self-pay | Admitting: Family Medicine

## 2015-06-27 ENCOUNTER — Encounter (HOSPITAL_COMMUNITY): Payer: Self-pay | Admitting: *Deleted

## 2015-06-27 ENCOUNTER — Inpatient Hospital Stay (HOSPITAL_COMMUNITY)
Admission: AD | Admit: 2015-06-27 | Discharge: 2015-06-27 | Disposition: A | Discharge status: Home / Self Care | Payer: 59 | Source: Ambulatory Visit | Attending: Family Medicine | Admitting: Family Medicine

## 2015-06-27 DIAGNOSIS — Z888 Allergy status to other drugs, medicaments and biological substances status: Secondary | ICD-10-CM | POA: Diagnosis not present

## 2015-06-27 DIAGNOSIS — O99282 Endocrine, nutritional and metabolic diseases complicating pregnancy, second trimester: Secondary | ICD-10-CM | POA: Diagnosis not present

## 2015-06-27 DIAGNOSIS — Z880 Allergy status to penicillin: Secondary | ICD-10-CM | POA: Insufficient documentation

## 2015-06-27 DIAGNOSIS — O9989 Other specified diseases and conditions complicating pregnancy, childbirth and the puerperium: Secondary | ICD-10-CM | POA: Diagnosis not present

## 2015-06-27 DIAGNOSIS — O26892 Other specified pregnancy related conditions, second trimester: Secondary | ICD-10-CM | POA: Insufficient documentation

## 2015-06-27 DIAGNOSIS — E860 Dehydration: Secondary | ICD-10-CM

## 2015-06-27 DIAGNOSIS — O219 Vomiting of pregnancy, unspecified: Secondary | ICD-10-CM | POA: Diagnosis not present

## 2015-06-27 DIAGNOSIS — Z3A15 15 weeks gestation of pregnancy: Secondary | ICD-10-CM | POA: Insufficient documentation

## 2015-06-27 DIAGNOSIS — O26899 Other specified pregnancy related conditions, unspecified trimester: Secondary | ICD-10-CM

## 2015-06-27 DIAGNOSIS — R109 Unspecified abdominal pain: Secondary | ICD-10-CM | POA: Diagnosis present

## 2015-06-27 DIAGNOSIS — R102 Pelvic and perineal pain: Secondary | ICD-10-CM

## 2015-06-27 LAB — URINALYSIS, ROUTINE W REFLEX MICROSCOPIC
Bilirubin Urine: NEGATIVE
Glucose, UA: NEGATIVE mg/dL
Hgb urine dipstick: NEGATIVE
Ketones, ur: 15 mg/dL — AB
Leukocytes, UA: NEGATIVE
Nitrite: NEGATIVE
Protein, ur: NEGATIVE mg/dL
Specific Gravity, Urine: 1.02 (ref 1.005–1.030)
pH: 7 (ref 5.0–8.0)

## 2015-06-27 MED ORDER — LACTATED RINGERS IV BOLUS (SEPSIS)
1000.0000 mL | Freq: Once | INTRAVENOUS | Status: AC
  Administered 2015-06-27: 1000 mL via INTRAVENOUS

## 2015-06-27 NOTE — Telephone Encounter (Signed)
Patient called in, she said she is at work and she's been feeling light headed and seeing dark spots. Patient was advised to go to MAU

## 2015-06-27 NOTE — MAU Provider Note (Signed)
Chief Complaint: Abdominal Pain   First Provider Initiated Contact with Patient 06/27/15 1328      SUBJECTIVE HPI: Deborah Gibbs is a 25 y.o. G2P0010 at [redacted]w[redacted]d by LMP who presents to maternity admissions via EMS reporting onset of lightheadedness and seeing spots this morning while at work, followed by sharp "spasm-like" pain in her right lower abdomen.  She has not tried any treatments for symptoms.  Her lightheadedness and visual disturbances have resolved spontaneously.  The abdominal pain is intermittent, increases when she moves or walks, and is described as sharp and severe.  She reports eating and drinking normally today but does report some nausea.   She denies regular cramping/contractions, vaginal bleeding, vaginal itching/burning, urinary symptoms, h/a, dizziness, vomiting, or fever/chills.     HPI  Past Medical History  Diagnosis Date  . Allergy   . Abortion   . Fibroid    Past Surgical History  Procedure Laterality Date  . Wisdom tooth extraction     Social History   Social History  . Marital Status: Single    Spouse Name: N/A  . Number of Children: N/A  . Years of Education: N/A   Occupational History  . Not on file.   Social History Main Topics  . Smoking status: Never Smoker   . Smokeless tobacco: Not on file  . Alcohol Use: Yes     Comment: 1 X month, socially.  . Drug Use: No     Comment: 1-2 X/day  . Sexual Activity: Yes   Other Topics Concern  . Not on file   Social History Narrative   No current facility-administered medications on file prior to encounter.   Current Outpatient Prescriptions on File Prior to Encounter  Medication Sig Dispense Refill  . clindamycin (CLEOCIN) 300 MG capsule Take 1 capsule (300 mg total) by mouth 3 (three) times daily. (Patient not taking: Reported on 06/27/2015) 30 capsule 0  . famotidine (PEPCID) 20 MG tablet Take 1 tablet (20 mg total) by mouth 2 (two) times daily. (Patient not taking: Reported on 04/13/2015) 30  tablet 0  . predniSONE (DELTASONE) 20 MG tablet Take 2 tablets (40 mg total) by mouth daily. (Patient not taking: Reported on 04/13/2015) 6 tablet 0   Allergies  Allergen Reactions  . Diphenhydramine Hcl Hives and Swelling  . Meloxicam Other (See Comments)    Blurred vision  . Penicillins Hives and Swelling    Has patient had a PCN reaction causing immediate rash, facial/tongue/throat swelling, SOB or lightheadedness with hypotension: Yes Has patient had a PCN reaction causing severe rash involving mucus membranes or skin necrosis: No Has patient had a PCN reaction that required hospitalization No Has patient had a PCN reaction occurring within the last 10 years: Yes If all of the above answers are "NO", then may proceed with Cephalosporin use.     ROS:  Review of Systems  Constitutional: Negative for fever, chills and fatigue.  Eyes: Positive for visual disturbance.  Respiratory: Negative for shortness of breath.   Cardiovascular: Negative for chest pain.  Gastrointestinal: Positive for nausea and abdominal pain. Negative for vomiting.  Genitourinary: Positive for pelvic pain. Negative for dysuria, flank pain, vaginal bleeding, vaginal discharge, difficulty urinating and vaginal pain.  Neurological: Positive for light-headedness. Negative for dizziness and headaches.  Psychiatric/Behavioral: Negative.      I have reviewed patient's Past Medical Hx, Surgical Hx, Family Hx, Social Hx, medications and allergies.   Physical Exam  Patient Vitals for the past 24 hrs:  BP Temp Temp src Pulse Resp SpO2 Height Weight  06/27/15 1228 107/68 mmHg 98.8 F (37.1 C) Oral 81 18 100 % 5\' 3"  (1.6 m) 155 lb (70.308 kg)   Constitutional: Well-developed, well-nourished female in no acute distress.  Cardiovascular: normal rate Respiratory: normal effort GI: Abd soft, tender in right inguinal area only. Pos BS x 4, no rebound tenderness or guarding MS: Extremities nontender, no edema, normal  ROM Neurologic: Alert and oriented x 4.  GU: Neg CVAT.  Cervix closed/thick/high, posterior, firm  FHT 150 by doppler  LAB RESULTS Results for orders placed or performed during the hospital encounter of 06/27/15 (from the past 24 hour(s))  Urinalysis, Routine w reflex microscopic (not at [facility])     Status: Abnormal   Collection Time: 06/27/15 12:16 PM  Result Value Ref Range   Color, Urine YELLOW YELLOW   APPearance HAZY (A) CLEAR   Specific Gravity, Urine 1.020 1.005 - 1.030   pH 7.0 5.0 - 8.0   Glucose, UA NEGATIVE NEGATIVE mg/dL   Hgb urine dipstick NEGATIVE NEGATIVE   Bilirubin Urine NEGATIVE NEGATIVE   Ketones, ur 15 (A) NEGATIVE mg/dL   Protein, ur NEGATIVE NEGATIVE mg/dL   Nitrite NEGATIVE NEGATIVE   Leukocytes, UA NEGATIVE NEGATIVE       IMAGING No results found.  MAU Management/MDM: Ordered labs and reviewed results.  No evidence of preterm labor or acute abdomen.  Likely round ligament pain.  Mild dehydration may have caused earlier lightheadedness/visual disturbances as well as triggered abdominal pain.  Encouraged pt to increase PO fluids and keep appt tomorrow in clinic.  Treatments in MAU included LR x 1000 ml.  Pt stable at time of discharge.  ASSESSMENT 1. Abdominal pain affecting pregnancy, antepartum   2. Mild dehydration   3. Nausea and vomiting during pregnancy prior to [redacted] weeks gestation   4. Pain of round ligament affecting pregnancy, antepartum     PLAN Discharge home Rest/ice/heat/Tylenol for round ligament pain F/U in clinic tomorrow as scheduled    Medication List    STOP taking these medications        clindamycin 300 MG capsule  Commonly known as:  CLEOCIN     famotidine 20 MG tablet  Commonly known as:  PEPCID     predniSONE 20 MG tablet  Commonly known as:  DELTASONE           Follow-up Information    Follow up with [facility].   Specialty:  Obstetrics and Gynecology   Why:  As scheduled tomorrow, Return to  MAU as needed for emergencies   Contact information:   [place] Certified Nurse-Midwife 06/27/2015  2:56 PM

## 2015-06-27 NOTE — Discharge Instructions (Signed)
Round Ligament Pain  The round ligament is a cord of muscle and tissue that helps to support the uterus. It can become a source of pain during pregnancy if it becomes stretched or twisted as the baby grows. The pain usually begins in the second trimester of pregnancy, and it can come and go until the baby is delivered. It is not a serious problem, and it does not cause harm to the baby.  Round ligament pain is usually a short, sharp, and pinching pain, but it can also be a dull, lingering, and aching pain. The pain is felt in the lower side of the abdomen or in the groin. It usually starts deep in the groin and moves up to the outside of the hip area. Pain can occur with:   A sudden change in position.   Rolling over in bed.   Coughing or sneezing.   Physical activity.  HOME CARE INSTRUCTIONS  Watch your condition for any changes. Take these steps to help with your pain:   When the pain starts, relax. Then try:    Sitting down.    Flexing your knees up to your abdomen.    Lying on your side with one pillow under your abdomen and another pillow between your legs.    Sitting in a warm bath for 15-20 minutes or until the pain goes away.   Take over-the-counter and prescription medicines only as told by your health care provider.   Move slowly when you sit and stand.   Avoid long walks if they cause pain.   Stop or lessen your physical activities if they cause pain.  SEEK MEDICAL CARE IF:   Your pain does not go away with treatment.   You feel pain in your back that you did not have before.   Your medicine is not helping.  SEEK IMMEDIATE MEDICAL CARE IF:   You develop a fever or chills.   You develop uterine contractions.   You develop vaginal bleeding.   You develop nausea or vomiting.   You develop diarrhea.   You have pain when you urinate.     This information is not intended to replace advice given to you by your health care provider. Make sure you discuss any questions you have with your health  care provider.     Document Released: 01/03/2008 Document Revised: 06/18/2011 Document Reviewed: 06/02/2014  Elsevier Interactive Patient Education 2016 [employer]

## 2015-06-27 NOTE — MAU Note (Signed)
Patient was at work and had sudden onset of lower abdominal pain, mostly on the right side that is constant and feels like "spasm" pain.  Patient denies LOF or vaginal bleeding.

## 2015-06-28 ENCOUNTER — Ambulatory Visit (INDEPENDENT_AMBULATORY_CARE_PROVIDER_SITE_OTHER): Payer: 59 | Admitting: Obstetrics and Gynecology

## 2015-06-28 ENCOUNTER — Encounter: Payer: Self-pay | Admitting: Obstetrics and Gynecology

## 2015-06-28 VITALS — BP 108/60 | HR 82 | Temp 98.4°F | Wt 154.4 lb

## 2015-06-28 DIAGNOSIS — O360120 Maternal care for anti-D [Rh] antibodies, second trimester, not applicable or unspecified: Secondary | ICD-10-CM

## 2015-06-28 DIAGNOSIS — Z23 Encounter for immunization: Secondary | ICD-10-CM | POA: Diagnosis not present

## 2015-06-28 DIAGNOSIS — Z113 Encounter for screening for infections with a predominantly sexual mode of transmission: Secondary | ICD-10-CM

## 2015-06-28 DIAGNOSIS — Z6791 Unspecified blood type, Rh negative: Secondary | ICD-10-CM | POA: Insufficient documentation

## 2015-06-28 DIAGNOSIS — Z3492 Encounter for supervision of normal pregnancy, unspecified, second trimester: Secondary | ICD-10-CM

## 2015-06-28 DIAGNOSIS — O26892 Other specified pregnancy related conditions, second trimester: Secondary | ICD-10-CM | POA: Insufficient documentation

## 2015-06-28 LAB — POCT URINALYSIS DIP (DEVICE)
Bilirubin Urine: NEGATIVE
Glucose, UA: NEGATIVE mg/dL
Ketones, ur: NEGATIVE mg/dL
Leukocytes, UA: NEGATIVE
Nitrite: NEGATIVE
Protein, ur: NEGATIVE mg/dL
Specific Gravity, Urine: 1.025 (ref 1.005–1.030)
Urobilinogen, UA: 0.2 mg/dL (ref 0.0–1.0)
pH: 6 (ref 5.0–8.0)

## 2015-06-28 NOTE — Patient Instructions (Signed)

## 2015-06-28 NOTE — Progress Notes (Signed)
   Subjective:    Deborah Gibbs is a G2P0010 [redacted]w[redacted]d being seen today for her first obstetrical visit.  Her obstetrical history is significant for none. Patient does intend to breast feed. Pregnancy history fully reviewed. States Pap done [employer] [facility] last month> ROI signed. Changed practices because "did not like doctor and did not get called back" Patient reports no complaints. She and partner excited about pregnancy.  Filed Vitals:   06/28/15 0838  BP: 108/60  Pulse: 82  Temp: 98.4 F (36.9 C)  Weight: 154 lb 6.4 oz (70.035 kg)    HISTORY: OB History  Gravida Para Term Preterm AB SAB TAB Ectopic Multiple Living  2    1  1    0    # Outcome Date GA Lbr Len/2nd Weight Sex Delivery Anes PTL Lv  2 Current           1 TAB 2011             Past Medical History  Diagnosis Date  . Allergy   . Abortion   . Fibroid    Past Surgical History  Procedure Laterality Date  . Wisdom tooth extraction     Family History  Problem Relation Age of Onset  . Hypertension Father   . Cancer Maternal Grandmother     Breast Cancer  . Cancer Brother     neuroblastoma     Exam    Uterus:   16 wk size  Pelvic Exam:    Perineum: No Hemorrhoids, Normal Perineum   Vulva: normal   Vagina:  normal discharge       Cervix: L/C   Adnexa: n/e   Bony Pelvis: average  System: Breast:  normal appearance, no masses or tenderness, Inspection negative, Normal to palpation without dominant masses   Skin: normal coloration and turgor, no rashes    Neurologic: oriented, normal, grossly non-focal   Extremities: normal strength, tone, and muscle mass   HEENT thyroid without masses   Mouth/Teeth mucous membranes moist, pharynx normal without lesions and dental hygiene good   Neck supple and no masses   Cardiovascular: regular rate and rhythm, no murmurs or gallops   Respiratory:  appears well, vitals normal, no respiratory distress, acyanotic, normal RR, ear and throat exam is normal, neck free of mass  or lymphadenopathy, chest clear, no wheezing, crepitations, rhonchi, normal symmetric air entry   Abdomen: soft, non-tender; bowel sounds normal; no masses,  no organomegaly   Urinary: urethral meatus normal      Assessment:    Pregnancy: G2P0010 Patient Active Problem List   Diagnosis Date Noted  . Rh negative status during pregnancy in second trimester, antepartum 06/28/2015  . Supervision of low-risk pregnancy 06/07/2015        Plan:     Initial labs drawn. Explained Rh neg Prenatal vitamins. Problem list reviewed and updated. Genetic Screening discussed Quad Screen: ordered.  Ultrasound discussed; fetal survey: requested. ordered  Follow up in 4 weeks. 50% of 30 min visit spent on counseling and coordination of care.     Deborah Gibbs 06/28/2015

## 2015-06-28 NOTE — Progress Notes (Signed)
Breastfeeding tip of the week reviewed Initial prenatal info packet given Flu vaccine today Initial prenatal labs

## 2015-06-29 LAB — PRENATAL PROFILE (SOLSTAS)
Antibody Screen: NEGATIVE
Basophils Absolute: 0 10*3/uL (ref 0.0–0.1)
Basophils Relative: 0 % (ref 0–1)
Eosinophils Absolute: 0.1 10*3/uL (ref 0.0–0.7)
Eosinophils Relative: 1 % (ref 0–5)
HCT: 32.1 % — ABNORMAL LOW (ref 36.0–46.0)
HIV 1&2 Ab, 4th Generation: NONREACTIVE
Hemoglobin: 10.8 g/dL — ABNORMAL LOW (ref 12.0–15.0)
Hepatitis B Surface Ag: NEGATIVE
Lymphocytes Relative: 18 % (ref 12–46)
Lymphs Abs: 2.1 10*3/uL (ref 0.7–4.0)
MCH: 28.8 pg (ref 26.0–34.0)
MCHC: 33.6 g/dL (ref 30.0–36.0)
MCV: 85.6 fL (ref 78.0–100.0)
MPV: 9.3 fL (ref 8.6–12.4)
Monocytes Absolute: 0.7 10*3/uL (ref 0.1–1.0)
Monocytes Relative: 6 % (ref 3–12)
Neutro Abs: 8.6 10*3/uL — ABNORMAL HIGH (ref 1.7–7.7)
Neutrophils Relative %: 75 % (ref 43–77)
Platelets: 257 10*3/uL (ref 150–400)
RBC: 3.75 MIL/uL — ABNORMAL LOW (ref 3.87–5.11)
RDW: 14.1 % (ref 11.5–15.5)
Rh Type: NEGATIVE
Rubella: 5.03 Index — ABNORMAL HIGH (ref <0.90)
WBC: 11.4 10*3/uL — ABNORMAL HIGH (ref 4.0–10.5)

## 2015-06-29 LAB — AFP TUMOR MARKER: AFP-Tumor Marker: 35.1 ng/mL — ABNORMAL HIGH (ref <6.1)

## 2015-06-29 LAB — GC/CHLAMYDIA PROBE AMP (~~LOC~~) NOT AT ARMC
Chlamydia: NEGATIVE
Neisseria Gonorrhea: NEGATIVE

## 2015-06-29 LAB — CULTURE, OB URINE: Colony Count: 8000

## 2015-06-30 LAB — PRESCRIPTION MONITORING PROFILE (19 PANEL)
Amphetamine/Meth: NEGATIVE ng/mL
Barbiturate Screen, Urine: NEGATIVE ng/mL
Benzodiazepine Screen, Urine: NEGATIVE ng/mL
Buprenorphine, Urine: NEGATIVE ng/mL
Cannabinoid Scrn, Ur: NEGATIVE ng/mL
Carisoprodol, Urine: NEGATIVE ng/mL
Cocaine Metabolites: NEGATIVE ng/mL
Creatinine, Urine: 203.24 mg/dL (ref >20.0)
Fentanyl, Ur: NEGATIVE ng/mL
MDMA URINE: NEGATIVE ng/mL
Meperidine, Ur: NEGATIVE ng/mL
Methadone Screen, Urine: NEGATIVE ng/mL
Methaqualone: NEGATIVE ng/mL
Nitrites, Initial: NEGATIVE ug/mL
Opiate Screen, Urine: NEGATIVE ng/mL
Oxycodone Screen, Ur: NEGATIVE ng/mL
Phencyclidine, Ur: NEGATIVE ng/mL
Propoxyphene: NEGATIVE ng/mL
Tapentadol, urine: NEGATIVE ng/mL
Tramadol Scrn, Ur: NEGATIVE ng/mL
Zolpidem, Urine: NEGATIVE ng/mL
pH, Initial: 5.7 pH (ref 4.5–8.9)

## 2015-06-30 LAB — HEMOGLOBINOPATHY EVALUATION
Hemoglobin Other: 0 %
Hgb A2 Quant: 2.6 % (ref 2.2–3.2)
Hgb A: 97.4 % (ref 96.8–97.8)
Hgb F Quant: 0 % (ref 0.0–2.0)
Hgb S Quant: 0 %

## 2015-07-19 ENCOUNTER — Ambulatory Visit (HOSPITAL_COMMUNITY)
Admission: RE | Admit: 2015-07-19 | Discharge: 2015-07-19 | Disposition: A | Discharge status: Home / Self Care | Payer: 59 | Source: Ambulatory Visit | Attending: Obstetrics and Gynecology | Admitting: Obstetrics and Gynecology

## 2015-07-19 DIAGNOSIS — Z36 Encounter for antenatal screening of mother: Secondary | ICD-10-CM | POA: Diagnosis not present

## 2015-07-19 DIAGNOSIS — Z3A18 18 weeks gestation of pregnancy: Secondary | ICD-10-CM | POA: Insufficient documentation

## 2015-07-19 DIAGNOSIS — Z3492 Encounter for supervision of normal pregnancy, unspecified, second trimester: Secondary | ICD-10-CM

## 2015-07-20 ENCOUNTER — Ambulatory Visit (HOSPITAL_COMMUNITY): Payer: 59

## 2015-07-26 ENCOUNTER — Encounter: Payer: Self-pay | Admitting: Certified Nurse Midwife

## 2015-08-03 ENCOUNTER — Ambulatory Visit (INDEPENDENT_AMBULATORY_CARE_PROVIDER_SITE_OTHER): Payer: 59 | Admitting: Certified Nurse Midwife

## 2015-08-03 ENCOUNTER — Encounter: Payer: Self-pay | Admitting: Certified Nurse Midwife

## 2015-08-03 VITALS — BP 115/68 | HR 73 | Wt 158.5 lb

## 2015-08-03 DIAGNOSIS — Z3492 Encounter for supervision of normal pregnancy, unspecified, second trimester: Secondary | ICD-10-CM

## 2015-08-03 LAB — POCT URINALYSIS DIP (DEVICE)
Bilirubin Urine: NEGATIVE
Glucose, UA: NEGATIVE mg/dL
Hgb urine dipstick: NEGATIVE
Leukocytes, UA: NEGATIVE
Nitrite: NEGATIVE
Protein, ur: 30 mg/dL — AB
Specific Gravity, Urine: 1.02 (ref 1.005–1.030)
Urobilinogen, UA: 1 mg/dL (ref 0.0–1.0)
pH: 7 (ref 5.0–8.0)

## 2015-08-03 NOTE — Progress Notes (Signed)
Breastfeeding tip of the week reviewed. 

## 2015-08-03 NOTE — Patient Instructions (Addendum)
Second Trimester of Pregnancy The second trimester is from week 13 through week 28, months 4 through 6. The second trimester is often a time when you feel your best. Your body has also adjusted to being pregnant, and you begin to feel better physically. Usually, morning sickness has lessened or quit completely, you may have more energy, and you may have an increase in appetite. The second trimester is also a time when the fetus is growing rapidly. At the end of the sixth month, the fetus is about 9 inches long and weighs about 1 pounds. You will likely begin to feel the baby move (quickening) between 18 and 20 weeks of the pregnancy. BODY CHANGES Your body goes through many changes during pregnancy. The changes vary from woman to woman.   Your weight will continue to increase. You will notice your lower abdomen bulging out.  You may begin to get stretch marks on your hips, abdomen, and breasts.  You may develop headaches that can be relieved by medicines approved by your health care provider.  You may urinate more often because the fetus is pressing on your bladder.  You may develop or continue to have heartburn as a result of your pregnancy.  You may develop constipation because certain hormones are causing the muscles that push waste through your intestines to slow down.  You may develop hemorrhoids or swollen, bulging veins (varicose veins).  You may have back pain because of the weight gain and pregnancy hormones relaxing your joints between the bones in your pelvis and as a result of a shift in weight and the muscles that support your balance.  Your breasts will continue to grow and be tender.  Your gums may bleed and may be sensitive to brushing and flossing.  Dark spots or blotches (chloasma, mask of pregnancy) may develop on your face. This will likely fade after the baby is born.  A dark line from your belly button to the pubic area (linea nigra) may appear. This will likely fade  after the baby is born.  You may have changes in your hair. These can include thickening of your hair, rapid growth, and changes in texture. Some women also have hair loss during or after pregnancy, or hair that feels dry or thin. Your hair will most likely return to normal after your baby is born. WHAT TO EXPECT AT YOUR PRENATAL VISITS During a routine prenatal visit:  You will be weighed to make sure you and the fetus are growing normally.  Your blood pressure will be taken.  Your abdomen will be measured to track your baby's growth.  The fetal heartbeat will be listened to.  Any test results from the previous visit will be discussed. Your health care provider may ask you:  How you are feeling.  If you are feeling the baby move.  If you have had any abnormal symptoms, such as leaking fluid, bleeding, severe headaches, or abdominal cramping.  If you are using any tobacco products, including cigarettes, chewing tobacco, and electronic cigarettes.  If you have any questions. Other tests that may be performed during your second trimester include:  Blood tests that check for:  Low iron levels (anemia).  Gestational diabetes (between 24 and 28 weeks).  Rh antibodies.  Urine tests to check for infections, diabetes, or protein in the urine.  An ultrasound to confirm the proper growth and development of the baby.  An amniocentesis to check for possible genetic problems.  Fetal screens for spina bifida   and Down syndrome.  HIV (human immunodeficiency virus) testing. Routine prenatal testing includes screening for HIV, unless you choose not to have this test. HOME CARE INSTRUCTIONS   Avoid all smoking, herbs, alcohol, and unprescribed drugs. These chemicals affect the formation and growth of the baby.  Do not use any tobacco products, including cigarettes, chewing tobacco, and electronic cigarettes. If you need help quitting, ask your health care provider. You may receive  counseling support and other resources to help you quit.  Follow your health care provider's instructions regarding medicine use. There are medicines that are either safe or unsafe to take during pregnancy.  Exercise only as directed by your health care provider. Experiencing uterine cramps is a good sign to stop exercising.  Continue to eat regular, healthy meals.  Wear a good support bra for breast tenderness.  Do not use hot tubs, steam rooms, or saunas.  Wear your seat belt at all times when driving.  Avoid raw meat, uncooked cheese, cat litter boxes, and soil used by cats. These carry germs that can cause birth defects in the baby.  Take your prenatal vitamins.  Take 1500-2000 mg of calcium daily starting at the 20th week of pregnancy until you deliver your baby.  Try taking a stool softener (if your health care provider approves) if you develop constipation. Eat more high-fiber foods, such as fresh vegetables or fruit and whole grains. Drink plenty of fluids to keep your urine clear or pale yellow.  Take warm sitz baths to soothe any pain or discomfort caused by hemorrhoids. Use hemorrhoid cream if your health care provider approves.  If you develop varicose veins, wear support hose. Elevate your feet for 15 minutes, 3-4 times a day. Limit salt in your diet.  Avoid heavy lifting, wear low heel shoes, and practice good posture.  Rest with your legs elevated if you have leg cramps or low back pain.  Visit your dentist if you have not gone yet during your pregnancy. Use a soft toothbrush to brush your teeth and be gentle when you floss.  A sexual relationship may be continued unless your health care provider directs you otherwise.  Continue to go to all your prenatal visits as directed by your health care provider. SEEK MEDICAL CARE IF:   You have dizziness.  You have mild pelvic cramps, pelvic pressure, or nagging pain in the abdominal area.  You have persistent nausea,  vomiting, or diarrhea.  You have a bad smelling vaginal discharge.  You have pain with urination. SEEK IMMEDIATE MEDICAL CARE IF:   You have a fever.  You are leaking fluid from your vagina.  You have spotting or bleeding from your vagina.  You have severe abdominal cramping or pain.  You have rapid weight gain or loss.  You have shortness of breath with chest pain.  You notice sudden or extreme swelling of your face, hands, ankles, feet, or legs.  You have not felt your baby move in over an hour.  You have severe headaches that do not go away with medicine.  You have vision changes.   This information is not intended to replace advice given to you by your health care provider. Make sure you discuss any questions you have with your health care provider.   Document Released: 03/20/2001 Document Revised: 04/16/2014 Document Reviewed: 05/27/2012 Elsevier Interactive Patient Education 2016 Else[employer] Glucose Tolerance Test The glucose tolerance test (GTT) is one of several tests used to diagnose diabetes mellitus. The GTT is a  blood test, and it may include a urine test as well. The GTT checks to see how your body processes sugar (glucose). For this test, you will consume a drink containing a high level of glucose. Your blood glucose levels will be checked before you consume the drink and then again 1, 2, 3, and possibly 4 hours after you consume it. Your health care provider may recommend that you have the GTT if you:  Have a family history of diabetes.   Are very overweight (obese).   Have experienced infections that keep coming back.   Have had numerous cuts or wounds that did not heal quickly, especially on your legs and feet.   Are a woman and have a history of giving birth to very large babies or a history of repeated fetal loss (stillbirth).  Have had glucose in your urine or high blood sugar:   During pregnancy.   After a heart attack, surgery, or  prolonged periods of high stress.  The GTT lasts 3-4 hours. Other than the glucose solution, you will not be allowed to eat or drink anything during the test. You must remain at the testing location to make sure that your blood and urine samples are taken on time. PREPARATION FOR TEST Eat normally for 3 days prior to the GTT test, including having plenty of carbohydrate-rich foods. Do not eat or drink anything except water during the final 12 hours before the test. You should not smoke or exercise during the test. In addition, your health care provider may ask you to stop taking certain medicines before the test. RESULTS It is your responsibility to obtain your test results. Ask the lab or department performing the test when and how you will get your results. Contact your health care provider to discuss any questions you have about your results. Range of Normal Values Ranges for normal values may vary among different labs and hospitals. You should always check with your health care provider after having lab work or other tests done to discuss whether your values are considered within normal limits.  Normal levels of blood glucose are as follows:  Fasting: less than 110 mg/dL or less than 6.1 mmol/L (SI units).  1 hour after consuming the glucose drink: less than 200 mg/dL or less than 11.1 mmol/L.  2 hours after consuming the glucose drink: less than 140 mg/dL or less than 7.8 mmol/L.  3 hours after consuming the glucose drink: 70-115 mg/dL or less than 6.4 mmol/L.  4 hours after consuming the glucose drink: 70-115 mg/dL or less than 6.4 mmol/L. The normal result for the urine test is negative, meaning that glucose is absent from your urine. Some substances can interfere with GTT results. These may include:  Blood pressure and heart failure medicines, including beta blockers, furosemide, and thiazides.   Anti-inflammatory medicines, including aspirin.   Nicotine.   Some psychiatric  medicines.   Oral contraceptives.   Diuretics or corticosteroids. Meaning of Results Outside Normal Value Ranges GTT test results that are above normal values may indicate health problems, such as:  Diabetes mellitus.   Acute stress response.   Cushing syndrome.   Tumors such as pheochromocytoma or glucagonoma.   Chronic renal failure.   Pancreatitis.   Hyperthyroidism.   Current infection.  Discuss your test results with your health care provider. He or she will use the results to make a diagnosis and determine a treatment plan that is right for you.   This information is not intended  to replace advice given to you by your health care provider. Make sure you discuss any questions you have with your health care provider.   Document Released: 04/18/2004 Document Revised: 04/16/2014 Document Reviewed: 07/31/2013 Elsevier Interactive Patient Education [employer].

## 2015-08-03 NOTE — Progress Notes (Signed)
Subjective:  Deborah Gibbs is a 25 y.o. G2P0010 at [redacted]w[redacted]d being seen today for ongoing prenatal care.  She is currently monitored for the following issues for this low-risk pregnancy and has Supervision of low-risk pregnancy and Rh negative status during pregnancy in second trimester, antepartum on her problem list.  Patient reports no complaints.  Contractions: Not present. Vag. Bleeding: None.  Movement: Present. Denies leaking of fluid.   The following portions of the patient's history were reviewed and updated as appropriate: allergies, current medications, past family history, past medical history, past social history, past surgical history and problem list. Problem list updated.  Objective:   Filed Vitals:   08/03/15 1058  BP: 115/68  Pulse: 73  Weight: 158 lb 8 oz (71.895 kg)    Fetal Status:     Movement: Present     General:  Alert, oriented and cooperative. Patient is in no acute distress.  Skin: Skin is warm and dry. No rash noted.   Cardiovascular: Normal heart rate noted  Respiratory: Normal respiratory effort, no problems with respiration noted  Abdomen: Soft, gravid, appropriate for gestational age. Pain/Pressure: Present     Pelvic: Vag. Bleeding: None     Cervical exam deferred        Extremities: Normal range of motion.  Edema: None  Mental Status: Normal mood and affect. Normal behavior. Normal judgment and thought content.   Urinalysis:      Assessment and Plan:  Pregnancy: G2P0010 at [redacted]w[redacted]d  1. Supervision of low-risk pregnancy, second trimester   Preterm labor symptoms and general obstetric precautions including but not limited to vaginal bleeding, contractions, leaking of fluid and fetal movement were reviewed in detail with the patient. Please refer to After Visit Summary for other counseling recommendations.  Return in about 4 weeks (around 08/31/2015).   Larey Days, CNM

## 2015-08-10 ENCOUNTER — Emergency Department (HOSPITAL_COMMUNITY)
Admission: EM | Admit: 2015-08-10 | Discharge: 2015-08-10 | Disposition: A | Discharge status: Home / Self Care | Payer: 59 | Attending: Emergency Medicine | Admitting: Emergency Medicine

## 2015-08-10 ENCOUNTER — Encounter (HOSPITAL_COMMUNITY): Payer: Self-pay | Admitting: Emergency Medicine

## 2015-08-10 DIAGNOSIS — R509 Fever, unspecified: Secondary | ICD-10-CM | POA: Insufficient documentation

## 2015-08-10 DIAGNOSIS — Z3A22 22 weeks gestation of pregnancy: Secondary | ICD-10-CM | POA: Diagnosis not present

## 2015-08-10 DIAGNOSIS — R05 Cough: Secondary | ICD-10-CM | POA: Insufficient documentation

## 2015-08-10 DIAGNOSIS — Z880 Allergy status to penicillin: Secondary | ICD-10-CM | POA: Insufficient documentation

## 2015-08-10 DIAGNOSIS — M791 Myalgia: Secondary | ICD-10-CM | POA: Diagnosis not present

## 2015-08-10 DIAGNOSIS — Z79899 Other long term (current) drug therapy: Secondary | ICD-10-CM | POA: Insufficient documentation

## 2015-08-10 DIAGNOSIS — R197 Diarrhea, unspecified: Secondary | ICD-10-CM | POA: Insufficient documentation

## 2015-08-10 DIAGNOSIS — O9989 Other specified diseases and conditions complicating pregnancy, childbirth and the puerperium: Secondary | ICD-10-CM | POA: Insufficient documentation

## 2015-08-10 DIAGNOSIS — R0981 Nasal congestion: Secondary | ICD-10-CM | POA: Insufficient documentation

## 2015-08-10 DIAGNOSIS — O219 Vomiting of pregnancy, unspecified: Secondary | ICD-10-CM | POA: Diagnosis not present

## 2015-08-10 DIAGNOSIS — Z86018 Personal history of other benign neoplasm: Secondary | ICD-10-CM | POA: Diagnosis not present

## 2015-08-10 DIAGNOSIS — R6889 Other general symptoms and signs: Secondary | ICD-10-CM

## 2015-08-10 LAB — CBC WITH DIFFERENTIAL/PLATELET
Basophils Absolute: 0 10*3/uL (ref 0.0–0.1)
Basophils Relative: 0 %
Eosinophils Absolute: 0.1 10*3/uL (ref 0.0–0.7)
Eosinophils Relative: 1 %
HCT: 28.9 % — ABNORMAL LOW (ref 36.0–46.0)
Hemoglobin: 10 g/dL — ABNORMAL LOW (ref 12.0–15.0)
Lymphocytes Relative: 12 %
Lymphs Abs: 1.8 10*3/uL (ref 0.7–4.0)
MCH: 29.9 pg (ref 26.0–34.0)
MCHC: 34.6 g/dL (ref 30.0–36.0)
MCV: 86.3 fL (ref 78.0–100.0)
Monocytes Absolute: 1.7 10*3/uL — ABNORMAL HIGH (ref 0.1–1.0)
Monocytes Relative: 11 %
Neutro Abs: 11.6 10*3/uL — ABNORMAL HIGH (ref 1.7–7.7)
Neutrophils Relative %: 76 %
Platelets: 226 10*3/uL (ref 150–400)
RBC: 3.35 MIL/uL — ABNORMAL LOW (ref 3.87–5.11)
RDW: 13.2 % (ref 11.5–15.5)
WBC: 15.2 10*3/uL — ABNORMAL HIGH (ref 4.0–10.5)

## 2015-08-10 LAB — COMPREHENSIVE METABOLIC PANEL
ALT: 11 U/L — ABNORMAL LOW (ref 14–54)
AST: 16 U/L (ref 15–41)
Albumin: 3.3 g/dL — ABNORMAL LOW (ref 3.5–5.0)
Alkaline Phosphatase: 30 U/L — ABNORMAL LOW (ref 38–126)
Anion gap: 8 (ref 5–15)
BUN: <5 mg/dL — ABNORMAL LOW (ref 6–20)
CO2: 22 mmol/L (ref 22–32)
Calcium: 9.2 mg/dL (ref 8.9–10.3)
Chloride: 105 mmol/L (ref 101–111)
Creatinine, Ser: 0.41 mg/dL — ABNORMAL LOW (ref 0.44–1.00)
GFR calc Af Amer: >60 mL/min (ref >60)
GFR calc non Af Amer: >60 mL/min (ref >60)
Glucose, Bld: 86 mg/dL (ref 65–99)
Potassium: 3.3 mmol/L — ABNORMAL LOW (ref 3.5–5.1)
Sodium: 135 mmol/L (ref 135–145)
Total Bilirubin: 0.3 mg/dL (ref 0.3–1.2)
Total Protein: 6.1 g/dL — ABNORMAL LOW (ref 6.5–8.1)

## 2015-08-10 LAB — HCG, QUANTITATIVE, PREGNANCY: hCG, Beta Chain, Quant, S: 36874 m[IU]/mL — ABNORMAL HIGH (ref <5)

## 2015-08-10 MED ORDER — OSELTAMIVIR PHOSPHATE 75 MG PO CAPS: 75.0000 mg | ORAL_CAPSULE | Freq: Two times a day (BID) | ORAL | Status: DC

## 2015-08-10 NOTE — ED Notes (Signed)
RR OB notified and will arrive to ED ASAP.

## 2015-08-10 NOTE — Discharge Instructions (Signed)

## 2015-08-10 NOTE — ED Notes (Signed)
Pt. concerned about low fetal movement for the last few days after she was exposed to flu , denies fever or chills , pt. Is [redacted] weeks pregnant , denies vaginal bleeding or abdominal contractions .

## 2015-08-10 NOTE — Progress Notes (Signed)
Pt is a G2P0 k@ [id number], at the [facility] ED c/o flu like sx and decreased fetal movement. FHT 140-150 over one minute, via doppler.  Notified Dr. Kennon Rounds, no further assessment required.

## 2015-08-10 NOTE — ED Provider Notes (Signed)
CSN: [id number]     Arrival date & time 08/10/15  0017 History   First MD Initiated Contact with Patient 08/10/15 0540     Chief Complaint  Patient presents with  . Routine Prenatal Visit     (Consider location/radiation/quality/duration/timing/severity/associated sxs/prior Treatment) HPI Comments: Patient who is 22 weeks into a, so far, uncomplicated pregnancy presents with symptoms of fever, body aches, congestion, cough, vomiting and diarrhea. Symptoms started 3-4 days ago. She states that her sister has been diagnosed with the flu and is hospitalized. She denies abdominal pain, vaginal bleeding, or vaginal discharge. She is concerned that the baby seems less active than usual. She has had regular prenatal care. G1P0.  The history is provided by the patient. No language interpreter was used.    Past Medical History  Diagnosis Date  . Allergy   . Abortion   . Fibroid    Past Surgical History  Procedure Laterality Date  . Wisdom tooth extraction     Family History  Problem Relation Age of Onset  . Hypertension Father   . Cancer Maternal Grandmother     Breast Cancer  . Cancer Brother     neuroblastoma   Social History  Substance Use Topics  . Smoking status: Never Smoker   . Smokeless tobacco: Never Used  . Alcohol Use: Yes   OB History    Gravida Para Term Preterm AB TAB SAB Ectopic Multiple Living   2    1 1     0     Review of Systems  Constitutional: Negative for fever and chills.  HENT: Positive for congestion. Negative for facial swelling and trouble swallowing.   Respiratory: Positive for cough. Negative for shortness of breath.   Cardiovascular: Negative.   Gastrointestinal: Positive for vomiting. Negative for abdominal pain.  Genitourinary: Negative for dysuria, vaginal bleeding and vaginal discharge.  Musculoskeletal: Positive for myalgias.  Skin: Negative.  Negative for rash.  Neurological: Negative.  Negative for weakness.      Allergies   Diphenhydramine hcl; Meloxicam; and Penicillins  Home Medications   Prior to Admission medications   Medication Sig Start Date End Date Taking? Authorizing Provider  acetaminophen (TYLENOL) 500 MG tablet Take 1,000 mg by mouth every 6 (six) hours as needed for mild pain.   Yes Historical Provider, MD  Prenatal Vit-Fe Fumarate-FA (PRENATAL MULTIVITAMIN) TABS tablet Take 1 tablet by mouth daily at 12 noon.   Yes Historical Provider, MD   BP 95/59 mmHg  Pulse 82  Temp(Src) 98.3 F (36.8 C) (Oral)  Resp 20  Ht 5\' 5"  (1.651 m)  Wt 68.493 kg  BMI 25.13 kg/m2  SpO2 99%  LMP 03/10/2015 Physical Exam  Constitutional: She is oriented to person, place, and time. She appears well-developed and well-nourished.  HENT:  Head: Normocephalic.  Right Ear: Tympanic membrane normal.  Left Ear: Tympanic membrane normal.  Nose: Mucosal edema present.  Mouth/Throat: Oropharynx is clear and moist.  Eyes: Conjunctivae are normal.  Neck: Normal range of motion. Neck supple.  Cardiovascular: Normal rate and regular rhythm.   Pulmonary/Chest: Effort normal and breath sounds normal. She has no wheezes. She has no rales.  Abdominal: Soft. Bowel sounds are normal. There is no tenderness. There is no rebound and no guarding.  Gravid abdomen, non-tender.   Musculoskeletal: Normal range of motion.  Neurological: She is alert and oriented to person, place, and time.  Skin: Skin is warm and dry. No rash noted.  Psychiatric: She has a normal mood and  affect.    ED Course  Procedures (including critical care time) Labs Review Labs Reviewed  COMPREHENSIVE METABOLIC PANEL - Abnormal; Notable for the following:    Potassium 3.3 (*)    BUN <5 (*)    Creatinine, Ser 0.41 (*)    Total Protein 6.1 (*)    Albumin 3.3 (*)    ALT 11 (*)    Alkaline Phosphatase 30 (*)    All other components within normal limits  CBC WITH DIFFERENTIAL/PLATELET - Abnormal; Notable for the following:    WBC 15.2 (*)    RBC 3.35  (*)    Hemoglobin 10.0 (*)    HCT 28.9 (*)    Neutro Abs 11.6 (*)    Monocytes Absolute 1.7 (*)    All other components within normal limits  HCG, QUANTITATIVE, PREGNANCY - Abnormal; Notable for the following:    hCG, Beta Chain, Quant, [place] [id number] (*)    All other components within normal limits    Imaging Review No results found. I have personally reviewed and evaluated these images and lab results as part of my medical decision-making.   EKG Interpretation None      MDM   Final diagnoses:  None    1. Flu like illness 2. Uncomplicated pregnancy  The patient presents with URI symptoms, fever, flu exposure, while [redacted] weeks pregnant and concern for less fetal movement. She has been seen by rapid response nurse and cleared from an OB standpoint. FHTs 142 - normal.  The patient is nontoxic in appearance with a febrile URI and flu exposure. Will start tamiflu. Stable for discharge.     Charlann Lange, PA-C 08/10/15 [id number]  Merryl Hacker, MD 08/10/15 1328

## 2015-08-11 ENCOUNTER — Encounter (HOSPITAL_COMMUNITY): Payer: Self-pay | Admitting: Emergency Medicine

## 2015-08-11 ENCOUNTER — Emergency Department (HOSPITAL_COMMUNITY)
Admission: EM | Admit: 2015-08-11 | Discharge: 2015-08-11 | Disposition: A | Discharge status: Home / Self Care | Payer: 59 | Attending: Emergency Medicine | Admitting: Emergency Medicine

## 2015-08-11 DIAGNOSIS — J209 Acute bronchitis, unspecified: Secondary | ICD-10-CM | POA: Diagnosis not present

## 2015-08-11 DIAGNOSIS — Z880 Allergy status to penicillin: Secondary | ICD-10-CM | POA: Insufficient documentation

## 2015-08-11 DIAGNOSIS — J40 Bronchitis, not specified as acute or chronic: Secondary | ICD-10-CM

## 2015-08-11 DIAGNOSIS — Z79899 Other long term (current) drug therapy: Secondary | ICD-10-CM | POA: Insufficient documentation

## 2015-08-11 DIAGNOSIS — R05 Cough: Secondary | ICD-10-CM | POA: Diagnosis present

## 2015-08-11 DIAGNOSIS — Z86018 Personal history of other benign neoplasm: Secondary | ICD-10-CM | POA: Insufficient documentation

## 2015-08-11 LAB — CBG MONITORING, ED: Glucose-Capillary: 97 mg/dL (ref 65–99)

## 2015-08-11 MED ORDER — CEPHALEXIN 500 MG PO CAPS: 500.0000 mg | ORAL_CAPSULE | Freq: Three times a day (TID) | ORAL | Status: DC

## 2015-08-11 NOTE — ED Provider Notes (Signed)
CSN: [id number]     Arrival date & time 08/11/15  1815 History   First MD Initiated Contact with Patient 08/11/15 2113     Chief Complaint  Patient presents with  . URI     (Consider location/radiation/quality/duration/timing/severity/associated sxs/prior Treatment) Patient is a 25 y.o. female presenting with URI. The history is provided by the patient (Patient complains of cough sinus congestion for a few days.).  URI Presenting symptoms: congestion and cough   Presenting symptoms: no fatigue   Severity:  Mild Onset quality:  Gradual Timing:  Intermittent Progression:  Waxing and waning Chronicity:  New Relieved by:  Nothing Associated symptoms: no headaches     Past Medical History  Diagnosis Date  . Allergy   . Abortion   . Fibroid    Past Surgical History  Procedure Laterality Date  . Wisdom tooth extraction     Family History  Problem Relation Age of Onset  . Hypertension Father   . Cancer Maternal Grandmother     Breast Cancer  . Cancer Brother     neuroblastoma   Social History  Substance Use Topics  . Smoking status: Never Smoker   . Smokeless tobacco: Never Used  . Alcohol Use: Yes   OB History    Gravida Para Term Preterm AB TAB SAB Ectopic Multiple Living   2    1 1     0     Review of Systems  Constitutional: Negative for appetite change and fatigue.  HENT: Positive for congestion. Negative for ear discharge and sinus pressure.   Eyes: Negative for discharge.  Respiratory: Positive for cough.   Cardiovascular: Negative for chest pain.  Gastrointestinal: Negative for abdominal pain and diarrhea.  Genitourinary: Negative for frequency and hematuria.  Musculoskeletal: Negative for back pain.  Skin: Negative for rash.  Neurological: Negative for seizures and headaches.  Psychiatric/Behavioral: Negative for hallucinations.      Allergies  Diphenhydramine hcl; Meloxicam; and Penicillins  Home Medications   Prior to Admission medications    Medication Sig Start Date End Date Taking? Authorizing Provider  acetaminophen (TYLENOL) 500 MG tablet Take 1,000 mg by mouth every 6 (six) hours as needed for mild pain.   Yes Historical Provider, MD  Prenatal Vit-Fe Fumarate-FA (PRENATAL MULTIVITAMIN) TABS tablet Take 1 tablet by mouth daily at 12 noon.   Yes Historical Provider, MD  cephALEXin (KEFLEX) 500 MG capsule Take 1 capsule (500 mg total) by mouth 3 (three) times daily. 08/11/15   Milton Ferguson, MD  oseltamivir (TAMIFLU) 75 MG capsule Take 1 capsule (75 mg total) by mouth every 12 (twelve) hours. Patient not taking: Reported on 08/11/2015 08/10/15   Charlann Lange, PA-C   BP 108/64 mmHg  Pulse 83  Temp(Src) 98.5 F (36.9 C) (Oral)  Resp 20  SpO2 100%  LMP 03/10/2015 Physical Exam  Constitutional: She is oriented to person, place, and time. She appears well-developed.  HENT:  Head: Normocephalic.  Eyes: Conjunctivae and EOM are normal. No scleral icterus.  Neck: Neck supple. No thyromegaly present.  Cardiovascular: Normal rate and regular rhythm.  Exam reveals no gallop and no friction rub.   No murmur heard. Pulmonary/Chest: No stridor. She has no wheezes. She has no rales. She exhibits no tenderness.  Abdominal: She exhibits no distension. There is no tenderness. There is no rebound.  Musculoskeletal: Normal range of motion. She exhibits no edema.  Lymphadenopathy:    She has no cervical adenopathy.  Neurological: She is oriented to person, place, and time.  She exhibits normal muscle tone. Coordination normal.  Skin: No rash noted. No erythema.  Psychiatric: She has a normal mood and affect. Her behavior is normal.    ED Course  Procedures (including critical care time) Labs Review Labs Reviewed  CBG MONITORING, ED    Imaging Review No results found. I have personally reviewed and evaluated these images and lab results as part of my medical decision-making.   EKG Interpretation None      MDM   Final diagnoses:   Bronchitis    Pt with bronchitis and sinusitis.  Pt txed with keflex and instructed to stay hydrated    Milton Ferguson, MD 08/11/15 2229

## 2015-08-11 NOTE — ED Notes (Signed)
Pt reports was seen yesterday at [facility], co URI, was prescribed TheraFlu, was told by pharmacist she cant have it due to pregnancy. Pt is 22 weeks preg. sts body ache , congestion, coughing. Stated they checked her baby and everything was well. sts took tylenol 2 hours ago for fever.

## 2015-08-11 NOTE — Discharge Instructions (Signed)
Drink plenty of fluids.  Follow up with your md if not improving. °

## 2015-08-11 NOTE — ED Notes (Signed)
Pt actively vomiting in the waiting room

## 2015-08-30 ENCOUNTER — Encounter: Payer: Self-pay | Admitting: Certified Nurse Midwife

## 2015-09-09 ENCOUNTER — Inpatient Hospital Stay (HOSPITAL_COMMUNITY)
Admission: AD | Admit: 2015-09-09 | Discharge: 2015-09-09 | Disposition: A | Discharge status: Home / Self Care | Payer: 59 | Source: Ambulatory Visit | Attending: Obstetrics and Gynecology | Admitting: Obstetrics and Gynecology

## 2015-09-09 ENCOUNTER — Encounter (HOSPITAL_COMMUNITY): Payer: Self-pay | Admitting: *Deleted

## 2015-09-09 DIAGNOSIS — Z880 Allergy status to penicillin: Secondary | ICD-10-CM | POA: Insufficient documentation

## 2015-09-09 DIAGNOSIS — N949 Unspecified condition associated with female genital organs and menstrual cycle: Secondary | ICD-10-CM

## 2015-09-09 DIAGNOSIS — O9989 Other specified diseases and conditions complicating pregnancy, childbirth and the puerperium: Secondary | ICD-10-CM

## 2015-09-09 DIAGNOSIS — Z3A26 26 weeks gestation of pregnancy: Secondary | ICD-10-CM | POA: Diagnosis not present

## 2015-09-09 DIAGNOSIS — R109 Unspecified abdominal pain: Secondary | ICD-10-CM | POA: Diagnosis present

## 2015-09-09 DIAGNOSIS — O26892 Other specified pregnancy related conditions, second trimester: Secondary | ICD-10-CM | POA: Diagnosis not present

## 2015-09-09 LAB — URINALYSIS, ROUTINE W REFLEX MICROSCOPIC
Bilirubin Urine: NEGATIVE
Glucose, UA: NEGATIVE mg/dL
Hgb urine dipstick: NEGATIVE
Ketones, ur: 40 mg/dL — AB
Nitrite: NEGATIVE
Protein, ur: NEGATIVE mg/dL
Specific Gravity, Urine: <1.005 — ABNORMAL LOW (ref 1.005–1.030)
pH: 7.5 (ref 5.0–8.0)

## 2015-09-09 LAB — URINE MICROSCOPIC-ADD ON: RBC / HPF: NONE SEEN RBC/hpf (ref 0–5)

## 2015-09-09 NOTE — MAU Note (Signed)
Pt stated she started having abd pain and cramping this morning on and off. Has had round ligament pain in the past but this is different.

## 2015-09-09 NOTE — MAU Provider Note (Signed)
History     CSN: [id number]  Arrival date and time: 09/09/15 1207   None     Chief Complaint  Patient presents with  . Contractions   HPI  Deborah Gibbs is a 25yo G2P0010 @ 26.1wks who presents for eval of intermittent low abd discomfort she describes as sharp. States that it was occuring 'back to back' at home, but has since completely resolved. Wonders if it may have been triggered from sleeping on her stomach. Denies leaking or bldg. Her preg has been followed at [facility] and has been essentially unremarkable.   OB History    Gravida Para Term Preterm AB TAB SAB Ectopic Multiple Living   2    1 1     0      Past Medical History  Diagnosis Date  . Allergy   . Abortion   . Fibroid     Past Surgical History  Procedure Laterality Date  . Wisdom tooth extraction      Family History  Problem Relation Age of Onset  . Hypertension Father   . Cancer Maternal Grandmother     Breast Cancer  . Cancer Brother     neuroblastoma    Social History  Substance Use Topics  . Smoking status: Never Smoker   . Smokeless tobacco: Never Used  . Alcohol Use: Yes    Allergies:  Allergies  Allergen Reactions  . Diphenhydramine Hcl Hives and Swelling  . Meloxicam Other (See Comments)    Blurred vision  . Penicillins Hives and Swelling    Has patient had a PCN reaction causing immediate rash, facial/tongue/throat swelling, SOB or lightheadedness with hypotension: Yes Has patient had a PCN reaction causing severe rash involving mucus membranes or skin necrosis: No Has patient had a PCN reaction that required hospitalization No Has patient had a PCN reaction occurring within the last 10 years: Yes If all of the above answers are "NO", then may proceed with Cephalosporin use.     Prescriptions prior to admission  Medication Sig Dispense Refill Last Dose  . acetaminophen (TYLENOL) 500 MG tablet Take 1,000 mg by mouth every 6 (six) hours as needed for mild pain.   Past Week at  Unknown time  . Prenatal Vit-Fe Fumarate-FA (PRENATAL MULTIVITAMIN) TABS tablet Take 1 tablet by mouth daily at 12 noon.   09/08/2015 at Unknown time  . cephALEXin (KEFLEX) 500 MG capsule Take 1 capsule (500 mg total) by mouth 3 (three) times daily. (Patient not taking: Reported on 09/09/2015) 21 capsule 0 Not Taking at Unknown time  . oseltamivir (TAMIFLU) 75 MG capsule Take 1 capsule (75 mg total) by mouth every 12 (twelve) hours. (Patient not taking: Reported on 08/11/2015) 10 capsule 0 Not Taking at Unknown time    ROS Physical Exam   Blood pressure 111/70, pulse 84, resp. rate 18, last menstrual period 03/10/2015.  Physical Exam  Constitutional: She is oriented to person, place, and time. She appears well-developed.  HENT:  Head: Normocephalic.  Neck: Normal range of motion.  Cardiovascular: Normal rate.   Respiratory: Effort normal.  GI:  EFM 130-140s, +10x10accels, no decels Approp for GA No ctx per toco  Genitourinary: Vagina normal.  Cx C/L  Musculoskeletal: Normal range of motion.  Neurological: She is alert and oriented to person, place, and time.  Skin: Skin is warm and dry.  Psychiatric: She has a normal mood and affect. Her behavior is normal. Thought content normal.   Urinalysis    Component Value Date/Time  COLORURINE YELLOW 09/09/2015 1210   APPEARANCEUR HAZY* 09/09/2015 1210   LABSPEC <1.005* 09/09/2015 1210   PHURINE 7.5 09/09/2015 1210   GLUCOSEU NEGATIVE 09/09/2015 1210   HGBUR NEGATIVE 09/09/2015 1210   BILIRUBINUR NEGATIVE 09/09/2015 1210   KETONESUR 40* 09/09/2015 1210   PROTEINUR NEGATIVE 09/09/2015 1210   UROBILINOGEN 1.0 08/03/2015 1117   NITRITE NEGATIVE 09/09/2015 1210   LEUKOCYTESUR SMALL* 09/09/2015 1210   Micro: 6-30SE, few bacteria  MAU Course  Procedures  MDM NST read Cx exam UA ordered  Assessment and Plan  IUP@26 .1wks Round lig pain  D/C home with preterm labor precautions Increase water intake to at least 64oz daily F/U as  scheduled at next OB appt  Serita Grammes CNM 09/09/2015, 1:39 PM

## 2015-09-09 NOTE — Discharge Instructions (Signed)
Round Ligament Pain The round ligament is a cord of muscle and tissue that helps to support the uterus. It can become a source of pain during pregnancy if it becomes stretched or twisted as the baby grows. The pain usually begins in the second trimester of pregnancy, and it can come and go until the baby is delivered. It is not a serious problem, and it does not cause harm to the baby. Round ligament pain is usually a short, sharp, and pinching pain, but it can also be a dull, lingering, and aching pain. The pain is felt in the lower side of the abdomen or in the groin. It usually starts deep in the groin and moves up to the outside of the hip area. Pain can occur with:  A sudden change in position.  Rolling over in bed.  Coughing or sneezing.  Physical activity. HOME CARE INSTRUCTIONS Watch your condition for any changes. Take these steps to help with your pain:  When the pain starts, relax. Then try:  Sitting down.  Flexing your knees up to your abdomen.  Lying on your side with one pillow under your abdomen and another pillow between your legs.  Sitting in a warm bath for 15-20 minutes or until the pain goes away.  Take over-the-counter and prescription medicines only as told by your health care provider.  Move slowly when you sit and stand.  Avoid long walks if they cause pain.  Stop or lessen your physical activities if they cause pain. SEEK MEDICAL CARE IF:  Your pain does not go away with treatment.  You feel pain in your back that you did not have before.  Your medicine is not helping. SEEK IMMEDIATE MEDICAL CARE IF:  You develop a fever or chills.  You develop uterine contractions.  You develop vaginal bleeding.  You develop nausea or vomiting.  You develop diarrhea.  You have pain when you urinate.   This information is not intended to replace advice given to you by your health care provider. Make sure you discuss any questions you have with your health  care provider.   Document Released: 01/03/2008 Document Revised: 06/18/2011 Document Reviewed: 06/02/2014 Elsevier Interactive Patient Education [employer].

## 2015-09-14 ENCOUNTER — Encounter: Payer: Self-pay | Admitting: General Practice

## 2015-09-15 ENCOUNTER — Ambulatory Visit (INDEPENDENT_AMBULATORY_CARE_PROVIDER_SITE_OTHER): Payer: 59 | Admitting: Medical

## 2015-09-15 VITALS — BP 101/60 | HR 84 | Wt 157.1 lb

## 2015-09-15 DIAGNOSIS — Z23 Encounter for immunization: Secondary | ICD-10-CM

## 2015-09-15 DIAGNOSIS — O360120 Maternal care for anti-D [Rh] antibodies, second trimester, not applicable or unspecified: Secondary | ICD-10-CM

## 2015-09-15 DIAGNOSIS — O360121 Maternal care for anti-D [Rh] antibodies, second trimester, fetus 1: Secondary | ICD-10-CM

## 2015-09-15 DIAGNOSIS — Z3492 Encounter for supervision of normal pregnancy, unspecified, second trimester: Secondary | ICD-10-CM

## 2015-09-15 LAB — POCT URINALYSIS DIP (DEVICE)
Bilirubin Urine: NEGATIVE
Glucose, UA: NEGATIVE mg/dL
Hgb urine dipstick: NEGATIVE
Ketones, ur: NEGATIVE mg/dL
Nitrite: NEGATIVE
Protein, ur: NEGATIVE mg/dL
Specific Gravity, Urine: 1.015 (ref 1.005–1.030)
Urobilinogen, UA: 0.2 mg/dL (ref 0.0–1.0)
pH: 7 (ref 5.0–8.0)

## 2015-09-15 LAB — CBC
HCT: 28.2 % — ABNORMAL LOW (ref 35.0–45.0)
Hemoglobin: 9.5 g/dL — ABNORMAL LOW (ref 11.7–15.5)
MCH: 28.8 pg (ref 27.0–33.0)
MCHC: 33.7 g/dL (ref 32.0–36.0)
MCV: 85.5 fL (ref 80.0–100.0)
MPV: 8.8 fL (ref 7.5–12.5)
Platelets: 253 10*3/uL (ref 140–400)
RBC: 3.3 MIL/uL — ABNORMAL LOW (ref 3.80–5.10)
RDW: 13.4 % (ref 11.0–15.0)
WBC: 15.6 10*3/uL — ABNORMAL HIGH (ref 3.8–10.8)

## 2015-09-15 MED ORDER — TETANUS-DIPHTH-ACELL PERTUSSIS 5-2.5-18.5 LF-MCG/0.5 IM SUSP
0.5000 mL | Freq: Once | INTRAMUSCULAR | Status: AC
  Administered 2015-09-15: 0.5 mL via INTRAMUSCULAR

## 2015-09-15 MED ORDER — RHO D IMMUNE GLOBULIN 1500 UNIT/2ML IJ SOSY
300.0000 ug | PREFILLED_SYRINGE | Freq: Once | INTRAMUSCULAR | Status: AC
  Administered 2015-09-15: 300 ug via INTRAMUSCULAR

## 2015-09-15 NOTE — Progress Notes (Signed)
Tip of week discussed with patient

## 2015-09-15 NOTE — Progress Notes (Signed)
Subjective:  Deborah Gibbs is a 25 y.o. G2P0010 at [redacted]w[redacted]d being seen today for ongoing prenatal care.  She is currently monitored for the following issues for this low-risk pregnancy and has Supervision of low-risk pregnancy and Rh negative status during pregnancy in second trimester, antepartum on her problem list.  Patient reports lower abdominal pain, intermittently.  Contractions: Not present. Vag. Bleeding: None.  Movement: Present. Denies leaking of fluid.   The following portions of the patient's history were reviewed and updated as appropriate: allergies, current medications, past family history, past medical history, past social history, past surgical history and problem list. Problem list updated.  Objective:   Filed Vitals:   09/15/15 1344  BP: 101/60  Pulse: 84  Weight: 157 lb 1.6 oz (71.26 kg)    Fetal Status: Fetal Heart Rate (bpm): 148 Fundal Height: 27 cm Movement: Present     General:  Alert, oriented and cooperative. Patient is in no acute distress.  Skin: Skin is warm and dry. No rash noted.   Cardiovascular: Normal heart rate noted  Respiratory: Normal respiratory effort, no problems with respiration noted  Abdomen: Soft, gravid, appropriate for gestational age. Pain/Pressure: Present     Pelvic: Vag. Bleeding: None     Cervical exam deferred        Extremities: Normal range of motion.  Edema: None  Mental Status: Normal mood and affect. Normal behavior. Normal judgment and thought content.   Urinalysis: Urine Protein: Negative Urine Glucose: Negative  Assessment and Plan:  Pregnancy: G2P0010 at [redacted]w[redacted]d  1. Rh negative status during pregnancy in second trimester, antepartum, fetus 1 - rho (d) immune globulin (RHIG/RHOPHYLAC) injection 300 mcg; Inject 2 mLs (300 mcg total) into the muscle once.  2. Supervision of low-risk pregnancy, second trimester - CBC - RPR - HIV antibody (with reflex) - Glucose Tolerance, 1 HR (50g) w/o Fasting - Tdap (BOOSTRIX) injection  0.5 mL; Inject 0.5 mLs into the muscle once.  3. Abdominal pain in pregnancy, second trimester - discussed use of Tylenol PRN and abdominal binder for round ligament pain Preterm labor symptoms and general obstetric precautions including but not limited to vaginal bleeding, contractions, leaking of fluid and fetal movement were reviewed in detail with the patient. Please refer to After Visit Summary for other counseling recommendations.  Return in about 2 weeks (around 09/29/2015) for ROB.   Luvenia Redden, PA-C

## 2015-09-15 NOTE — Progress Notes (Signed)
Pt experiencing some pressure.  28 week labs today.

## 2015-09-15 NOTE — Patient Instructions (Signed)
Fetal Movement Counts Patient Name: __________________________________________________ Patient Due Date: ____________________ Performing a fetal movement count is highly recommended in high-risk pregnancies, but it is good for every pregnant woman to do. Your health care provider may ask you to start counting fetal movements at 28 weeks of the pregnancy. Fetal movements often increase:  After eating a full meal.  After physical activity.  After eating or drinking something sweet or cold.  At rest. Pay attention to when you feel the baby is most active. This will help you notice a pattern of your baby's sleep and wake cycles and what factors contribute to an increase in fetal movement. It is important to perform a fetal movement count at the same time each day when your baby is normally most active.  HOW TO COUNT FETAL MOVEMENTS 1. Find a quiet and comfortable area to sit or lie down on your left side. Lying on your left side provides the best blood and oxygen circulation to your baby. 2. Write down the day and time on a sheet of paper or in a journal. 3. Start counting kicks, flutters, swishes, rolls, or jabs in a 2-hour period. You should feel at least 10 movements within 2 hours. 4. If you do not feel 10 movements in 2 hours, wait 2-3 hours and count again. Look for a change in the pattern or not enough counts in 2 hours. SEEK MEDICAL CARE IF:  You feel less than 10 counts in 2 hours, tried twice.  There is no movement in over an hour.  The pattern is changing or taking longer each day to reach 10 counts in 2 hours.  You feel the baby is not moving as he or she usually does. Date: ____________ Movements: ____________ Start time: ____________ Deborah Gibbs time: ____________  Date: ____________ Movements: ____________ Start time: ____________ Deborah Gibbs time: ____________ Date: ____________ Movements: ____________ Start time: ____________ Deborah Gibbs time: ____________ Date: ____________ Movements:  ____________ Start time: ____________ Deborah Gibbs time: ____________ Date: ____________ Movements: ____________ Start time: ____________ Deborah Gibbs time: ____________ Date: ____________ Movements: ____________ Start time: ____________ Deborah Gibbs time: ____________ Date: ____________ Movements: ____________ Start time: ____________ Deborah Gibbs time: ____________ Date: ____________ Movements: ____________ Start time: ____________ Deborah Gibbs time: ____________  Date: ____________ Movements: ____________ Start time: ____________ Deborah Gibbs time: ____________ Date: ____________ Movements: ____________ Start time: ____________ Deborah Gibbs time: ____________ Date: ____________ Movements: ____________ Start time: ____________ Deborah Gibbs time: ____________ Date: ____________ Movements: ____________ Start time: ____________ Deborah Gibbs time: ____________ Date: ____________ Movements: ____________ Start time: ____________ Deborah Gibbs time: ____________ Date: ____________ Movements: ____________ Start time: ____________ Deborah Gibbs time: ____________ Date: ____________ Movements: ____________ Start time: ____________ Deborah Gibbs time: ____________  Date: ____________ Movements: ____________ Start time: ____________ Deborah Gibbs time: ____________ Date: ____________ Movements: ____________ Start time: ____________ Deborah Gibbs time: ____________ Date: ____________ Movements: ____________ Start time: ____________ Deborah Gibbs time: ____________ Date: ____________ Movements: ____________ Start time: ____________ Deborah Gibbs time: ____________ Date: ____________ Movements: ____________ Start time: ____________ Deborah Gibbs time: ____________ Date: ____________ Movements: ____________ Start time: ____________ Deborah Gibbs time: ____________ Date: ____________ Movements: ____________ Start time: ____________ Deborah Gibbs time: ____________  Date: ____________ Movements: ____________ Start time: ____________ Deborah Gibbs time: ____________ Date: ____________ Movements: ____________ Start time: ____________ Deborah Gibbs  time: ____________ Date: ____________ Movements: ____________ Start time: ____________ Deborah Gibbs time: ____________ Date: ____________ Movements: ____________ Start time: ____________ Deborah Gibbs time: ____________ Date: ____________ Movements: ____________ Start time: ____________ Deborah Gibbs time: ____________ Date: ____________ Movements: ____________ Start time: ____________ Deborah Gibbs time: ____________ Date: ____________ Movements: ____________ Start time: ____________ Deborah Gibbs time: ____________  Date: ____________ Movements: ____________ Start time: ____________ Deborah Gibbs  time: ____________ Date: ____________ Movements: ____________ Start time: ____________ Deborah Gibbs time: ____________ Date: ____________ Movements: ____________ Start time: ____________ Deborah Gibbs time: ____________ Date: ____________ Movements: ____________ Start time: ____________ Deborah Gibbs time: ____________ Date: ____________ Movements: ____________ Start time: ____________ Deborah Gibbs time: ____________ Date: ____________ Movements: ____________ Start time: ____________ Deborah Gibbs time: ____________ Date: ____________ Movements: ____________ Start time: ____________ Deborah Gibbs time: ____________  Date: ____________ Movements: ____________ Start time: ____________ Deborah Gibbs time: ____________ Date: ____________ Movements: ____________ Start time: ____________ Deborah Gibbs time: ____________ Date: ____________ Movements: ____________ Start time: ____________ Deborah Gibbs time: ____________ Date: ____________ Movements: ____________ Start time: ____________ Deborah Gibbs time: ____________ Date: ____________ Movements: ____________ Start time: ____________ Deborah Gibbs time: ____________ Date: ____________ Movements: ____________ Start time: ____________ Deborah Gibbs time: ____________ Date: ____________ Movements: ____________ Start time: ____________ Deborah Gibbs time: ____________  Date: ____________ Movements: ____________ Start time: ____________ Deborah Gibbs time: ____________ Date: ____________  Movements: ____________ Start time: ____________ Deborah Gibbs time: ____________ Date: ____________ Movements: ____________ Start time: ____________ Deborah Gibbs time: ____________ Date: ____________ Movements: ____________ Start time: ____________ Deborah Gibbs time: ____________ Date: ____________ Movements: ____________ Start time: ____________ Deborah Gibbs time: ____________ Date: ____________ Movements: ____________ Start time: ____________ Deborah Gibbs time: ____________ Date: ____________ Movements: ____________ Start time: ____________ Deborah Gibbs time: ____________  Date: ____________ Movements: ____________ Start time: ____________ Deborah Gibbs time: ____________ Date: ____________ Movements: ____________ Start time: ____________ Deborah Gibbs time: ____________ Date: ____________ Movements: ____________ Start time: ____________ Deborah Gibbs time: ____________ Date: ____________ Movements: ____________ Start time: ____________ Deborah Gibbs time: ____________ Date: ____________ Movements: ____________ Start time: ____________ Deborah Gibbs time: ____________ Date: ____________ Movements: ____________ Start time: ____________ Deborah Gibbs time: ____________   This information is not intended to replace advice given to you by your health care provider. Make sure you discuss any questions you have with your health care provider.   Document Released: 04/25/2006 Document Revised: 04/16/2014 Document Reviewed: 01/21/2012 Elsevier Interactive Patient Education 2016 [place] of Pregnancy The third trimester is from week 29 through week 42, months 7 through 9. This trimester is when your unborn baby (fetus) is growing very fast. At the end of the ninth month, the unborn baby is about 20 inches in length. It weighs about 6-10 pounds.  HOME CARE   Avoid all smoking, herbs, and alcohol. Avoid drugs not approved by your doctor.  Do not use any tobacco products, including cigarettes, chewing tobacco, and electronic cigarettes. If you need help  quitting, ask your doctor. You may get counseling or other support to help you quit.  Only take medicine as told by your doctor. Some medicines are safe and some are not during pregnancy.  Exercise only as told by your doctor. Stop exercising if you start having cramps.  Eat regular, healthy meals.  Wear a good support bra if your breasts are tender.  Do not use hot tubs, steam rooms, or saunas.  Wear your seat belt when driving.  Avoid raw meat, uncooked cheese, and liter boxes and soil used by cats.  Take your prenatal vitamins.  Take 1500-2000 milligrams of calcium daily starting at the 20th week of pregnancy until you deliver your baby.  Try taking medicine that helps you poop (stool softener) as needed, and if your doctor approves. Eat more fiber by eating fresh fruit, vegetables, and whole grains. Drink enough fluids to keep your pee (urine) clear or pale yellow.  Take warm water baths (sitz baths) to soothe pain or discomfort caused by hemorrhoids. Use hemorrhoid cream if your doctor approves.  If you have puffy, bulging veins (varicose veins), wear support hose. Raise (elevate) your  feet for 15 minutes, 3-4 times a day. Limit salt in your diet.  Avoid heavy lifting, wear low heels, and sit up straight.  Rest with your legs raised if you have leg cramps or low back pain.  Visit your dentist if you have not gone during your pregnancy. Use a soft toothbrush to brush your teeth. Be gentle when you floss.  You can have sex (intercourse) unless your doctor tells you not to.  Do not travel far distances unless you must. Only do so with your doctor's approval.  Take prenatal classes.  Practice driving to the hospital.  Pack your hospital bag.  Prepare the baby's room.  Go to your doctor visits. GET HELP IF: 5. You are not sure if you are in labor or if your water has broken. 6. You are dizzy. 7. You have mild cramps or pressure in your lower belly (abdominal). 8. You  have a nagging pain in your belly area. 9. You continue to feel sick to your stomach (nauseous), throw up (vomit), or have watery poop (diarrhea). 10. You have bad smelling fluid coming from your vagina. 11. You have pain with peeing (urination). GET HELP RIGHT AWAY IF:   You have a fever.  You are leaking fluid from your vagina.  You are spotting or bleeding from your vagina.  You have severe belly cramping or pain.  You lose or gain weight rapidly.  You have trouble catching your breath and have chest pain.  You notice sudden or extreme puffiness (swelling) of your face, hands, ankles, feet, or legs.  You have not felt the baby move in over an hour.  You have severe headaches that do not go away with medicine.  You have vision changes.   This information is not intended to replace advice given to you by your health care provider. Make sure you discuss any questions you have with your health care provider.   Document Released: 06/20/2009 Document Revised: 04/16/2014 Document Reviewed: 05/27/2012 Elsevier Interactive Patient Education [employer].

## 2015-09-16 LAB — GLUCOSE TOLERANCE, 1 HOUR (50G) W/O FASTING: Glucose, 1 Hr, gestational: 77 mg/dL (ref <140)

## 2015-09-16 LAB — HIV ANTIBODY (ROUTINE TESTING W REFLEX): HIV 1&2 Ab, 4th Generation: NONREACTIVE

## 2015-09-16 LAB — RPR

## 2015-09-29 ENCOUNTER — Ambulatory Visit (INDEPENDENT_AMBULATORY_CARE_PROVIDER_SITE_OTHER): Payer: 59 | Admitting: Obstetrics and Gynecology

## 2015-09-29 VITALS — BP 118/62 | HR 91 | Wt 161.6 lb

## 2015-09-29 DIAGNOSIS — Z3493 Encounter for supervision of normal pregnancy, unspecified, third trimester: Secondary | ICD-10-CM

## 2015-09-29 DIAGNOSIS — O360120 Maternal care for anti-D [Rh] antibodies, second trimester, not applicable or unspecified: Secondary | ICD-10-CM

## 2015-09-29 NOTE — Progress Notes (Signed)
Subjective:  Deborah Gibbs is a 25 y.o. G2P0010 at [redacted]w[redacted]d being seen today for ongoing prenatal care.  She is currently monitored for the following issues for this low-risk pregnancy and has Supervision of low-risk pregnancy and Rh negative status during pregnancy in second trimester, antepartum on her problem list.  Patient reports no complaints.   Contractions: Not present.  .  Movement: Present. Denies leaking of fluid.   The following portions of the patient's history were reviewed and updated as appropriate: allergies, current medications, past family history, past medical history, past social history, past surgical history and problem list. Problem list updated.  Objective:   Filed Vitals:   09/29/15 1409  BP: 118/62  Pulse: 91  Weight: 161 lb 9.6 oz (73.301 kg)    Fetal Status: Fetal Heart Rate (bpm): 146 Fundal Height: 31 cm Movement: Present     General:  Alert, oriented and cooperative. Patient is in no acute distress.  Skin: Skin is warm and dry. No rash noted.   Cardiovascular: Normal heart rate noted  Respiratory: Normal respiratory effort, no problems with respiration noted  Abdomen: Soft, gravid, appropriate for gestational age. Pain/Pressure: Absent     Pelvic:  Cervical exam deferred        Extremities: Normal range of motion.     Mental Status: Normal mood and affect. Normal behavior. Normal judgment and thought content.   Urinalysis:      Assessment and Plan:  Pregnancy: G2P0010 at [redacted]w[redacted]d  1. Supervision of low-risk pregnancy, third trimester Routine care. Interested in water birth. Information given for classes. Told to increase chewables to 3/day given slight anemia. Pt states unable to take regular pills  2. Rh negative status during pregnancy in second trimester, antepartum, not applicable or unspecified fetus S/p rhogam already   Preterm labor symptoms and general obstetric precautions including but not limited to vaginal bleeding, contractions, leaking of  fluid and fetal movement were reviewed in detail with the patient. Please refer to After Visit Summary for other counseling recommendations.  Return in about 2 weeks (around 10/13/2015).   Aletha Halim, MD

## 2015-10-22 ENCOUNTER — Encounter (HOSPITAL_COMMUNITY): Payer: Self-pay | Admitting: *Deleted

## 2015-10-22 ENCOUNTER — Inpatient Hospital Stay (HOSPITAL_COMMUNITY)
Admission: AD | Admit: 2015-10-22 | Discharge: 2015-10-22 | Disposition: A | Discharge status: Home / Self Care | Payer: 59 | Source: Ambulatory Visit | Attending: Obstetrics & Gynecology | Admitting: Obstetrics & Gynecology

## 2015-10-22 DIAGNOSIS — Z3492 Encounter for supervision of normal pregnancy, unspecified, second trimester: Secondary | ICD-10-CM

## 2015-10-22 DIAGNOSIS — Z8249 Family history of ischemic heart disease and other diseases of the circulatory system: Secondary | ICD-10-CM | POA: Diagnosis not present

## 2015-10-22 DIAGNOSIS — Z880 Allergy status to penicillin: Secondary | ICD-10-CM | POA: Insufficient documentation

## 2015-10-22 DIAGNOSIS — Z3A33 33 weeks gestation of pregnancy: Secondary | ICD-10-CM | POA: Diagnosis not present

## 2015-10-22 DIAGNOSIS — Z888 Allergy status to other drugs, medicaments and biological substances status: Secondary | ICD-10-CM | POA: Insufficient documentation

## 2015-10-22 DIAGNOSIS — O26893 Other specified pregnancy related conditions, third trimester: Secondary | ICD-10-CM | POA: Insufficient documentation

## 2015-10-22 DIAGNOSIS — O9989 Other specified diseases and conditions complicating pregnancy, childbirth and the puerperium: Secondary | ICD-10-CM | POA: Diagnosis not present

## 2015-10-22 DIAGNOSIS — R109 Unspecified abdominal pain: Secondary | ICD-10-CM | POA: Insufficient documentation

## 2015-10-22 NOTE — Discharge Instructions (Signed)
Braxton Hicks Contractions °Contractions of the uterus can occur throughout pregnancy. Contractions are not always a sign that you are in labor.  °WHAT ARE BRAXTON HICKS CONTRACTIONS?  °Contractions that occur before labor are called Braxton Hicks contractions, or false labor. Toward the end of pregnancy (32-34 weeks), these contractions can develop more often and may become more forceful. This is not true labor because these contractions do not result in opening (dilatation) and thinning of the cervix. They are sometimes difficult to tell apart from true labor because these contractions can be forceful and people have different pain tolerances. You should not feel embarrassed if you go to the hospital with false labor. Sometimes, the only way to tell if you are in true labor is for your health care provider to look for changes in the cervix. °If there are no prenatal problems or other health problems associated with the pregnancy, it is completely safe to be sent home with false labor and await the onset of true labor. °HOW CAN YOU TELL THE DIFFERENCE BETWEEN TRUE AND FALSE LABOR? °False Labor °· The contractions of false labor are usually shorter and not as hard as those of true labor.   °· The contractions are usually irregular.   °· The contractions are often felt in the front of the lower abdomen and in the groin.   °· The contractions may go away when you walk around or change positions while lying down.   °· The contractions get weaker and are shorter lasting as time goes on.   °· The contractions do not usually become progressively stronger, regular, and closer together as with true labor.   °True Labor °· Contractions in true labor last 30-70 seconds, become very regular, usually become more intense, and increase in frequency.   °· The contractions do not go away with walking.   °· The discomfort is usually felt in the top of the uterus and spreads to the lower abdomen and low back.   °· True labor can be  determined by your health care provider with an exam. This will show that the cervix is dilating and getting thinner.   °WHAT TO REMEMBER °· Keep up with your usual exercises and follow other instructions given by your health care provider.   °· Take medicines as directed by your health care provider.   °· Keep your regular prenatal appointments.   °· Eat and drink lightly if you think you are going into labor.   °· If Braxton Hicks contractions are making you uncomfortable:   °¨ Change your position from lying down or resting to walking, or from walking to resting.   °¨ Sit and rest in a tub of warm water.   °¨ Drink 2-3 glasses of water. Dehydration may cause these contractions.   °¨ Do slow and deep breathing several times an hour.   °WHEN SHOULD I SEEK IMMEDIATE MEDICAL CARE? °Seek immediate medical care if: °· Your contractions become stronger, more regular, and closer together.   °· You have fluid leaking or gushing from your vagina.   °· You have a fever.   °· You pass blood-tinged mucus.   °· You have vaginal bleeding.   °· You have continuous abdominal pain.   °· You have low back pain that you never had before.   °· You feel your baby's head pushing down and causing pelvic pressure.   °· Your baby is not moving as much as it used to.   °  °This information is not intended to replace advice given to you by your health care provider. Make sure you discuss any questions you have with your health care   provider. °  °Document Released: 03/26/2005 Document Revised: 03/31/2013 Document Reviewed: 01/05/2013 °Elsevier Interactive Patient Education ©2016 [employer] ° °

## 2015-10-22 NOTE — MAU Provider Note (Signed)
History   G2P0010 @ 33.5 wks in with abd pain that started today while she was at work. Denies ROM or vag bleeding.   CSN: [id number]  Arrival date & time 10/22/15  1739   First Provider Initiated Contact with Patient 10/22/15 1757      Chief Complaint  Patient presents with  . Labor Eval    HPI  Past Medical History  Diagnosis Date  . Allergy   . Abortion   . Fibroid     Past Surgical History  Procedure Laterality Date  . Wisdom tooth extraction      Family History  Problem Relation Age of Onset  . Hypertension Father   . Cancer Maternal Grandmother     Breast Cancer  . Cancer Brother     neuroblastoma    Social History  Substance Use Topics  . Smoking status: Never Smoker   . Smokeless tobacco: Never Used  . Alcohol Use: Yes    OB History    Gravida Para Term Preterm AB TAB SAB Ectopic Multiple Living   2    1 1     0      Review of Systems  Constitutional: Negative.   HENT: Negative.   Eyes: Negative.   Respiratory: Negative.   Cardiovascular: Negative.   Gastrointestinal: Positive for abdominal pain.  Endocrine: Negative.   Genitourinary: Negative.   Musculoskeletal: Negative.   Skin: Negative.   Allergic/Immunologic: Negative.   Neurological: Negative.   Hematological: Negative.   Psychiatric/Behavioral: Negative.     Allergies  Diphenhydramine hcl; Meloxicam; and Penicillins  Home Medications  No current outpatient prescriptions on file.  BP 111/63 mmHg  Pulse 93  Temp(Src) 98.7 F (37.1 C) (Oral)  Resp 20  LMP 03/10/2015  Physical Exam  Constitutional: She is oriented to person, place, and time. She appears well-developed and well-nourished.  HENT:  Head: Normocephalic.  Eyes: Pupils are equal, round, and reactive to light.  Neck: Normal range of motion.  Cardiovascular: Normal rate, regular rhythm, normal heart sounds and intact distal pulses.   Pulmonary/Chest: Effort normal and breath sounds normal.  Abdominal: Soft.  Bowel sounds are normal.  Genitourinary: Vagina normal and uterus normal.  Musculoskeletal: Normal range of motion.  Neurological: She is alert and oriented to person, place, and time. She has normal reflexes.  Skin: Skin is warm and dry.  Psychiatric: She has a normal mood and affect. Her behavior is normal. Judgment and thought content normal.    MAU Course  Procedures (including critical care time)  Labs Reviewed  URINALYSIS, [place] (NOT AT [facility])   No results found.   1. Supervision of low-risk pregnancy, second trimester       MDM  Abd pain in preg. SVE cl/th/post/high. FHR pattern reassuring.no uc's per palp anf EFM. No uc's will d/c home

## 2015-10-22 NOTE — MAU Note (Addendum)
Brought in by EMS;C/o ucs since Thurs- ucs got stronger around 1430 this afternoon; having pinkish discharge; denies SROM; passed mucous plug on Thurs; rates her pain @ 5;

## 2015-10-24 ENCOUNTER — Encounter: Payer: Self-pay | Admitting: Family Medicine

## 2015-10-24 ENCOUNTER — Ambulatory Visit (INDEPENDENT_AMBULATORY_CARE_PROVIDER_SITE_OTHER): Payer: 59 | Admitting: Family Medicine

## 2015-10-24 VITALS — BP 107/60 | HR 90 | Wt 159.4 lb

## 2015-10-24 DIAGNOSIS — Z3493 Encounter for supervision of normal pregnancy, unspecified, third trimester: Secondary | ICD-10-CM

## 2015-10-24 DIAGNOSIS — O360120 Maternal care for anti-D [Rh] antibodies, second trimester, not applicable or unspecified: Secondary | ICD-10-CM

## 2015-10-24 LAB — POCT URINALYSIS DIP (DEVICE)
Bilirubin Urine: NEGATIVE
Bilirubin Urine: NEGATIVE
Glucose, UA: NEGATIVE mg/dL
Glucose, UA: NEGATIVE mg/dL
Hgb urine dipstick: NEGATIVE
Hgb urine dipstick: NEGATIVE
Ketones, ur: 40 mg/dL — AB
Ketones, ur: 15 mg/dL — AB
Nitrite: NEGATIVE
Nitrite: NEGATIVE
Protein, ur: NEGATIVE mg/dL
Protein, ur: NEGATIVE mg/dL
Specific Gravity, Urine: 1.015 (ref 1.005–1.030)
Specific Gravity, Urine: 1.015 (ref 1.005–1.030)
Urobilinogen, UA: 0.2 mg/dL (ref 0.0–1.0)
Urobilinogen, UA: 0.2 mg/dL (ref 0.0–1.0)
pH: 6.5 (ref 5.0–8.0)
pH: 7 (ref 5.0–8.0)

## 2015-10-24 NOTE — Progress Notes (Signed)
Subjective:  Deborah Gibbs is a 25 y.o. G2P0010 at [redacted]w[redacted]d being seen today for ongoing prenatal care.  She is currently monitored for the following issues for this low-risk pregnancy and has Supervision of low-risk pregnancy and Rh negative status during pregnancy in second trimester, antepartum on her problem list.  Patient reports occasional contractions.  Contractions: Irregular. Vag. Bleeding: None.  Movement: Present. Denies leaking of fluid.   The following portions of the patient's history were reviewed and updated as appropriate: allergies, current medications, past family history, past medical history, past social history, past surgical history and problem list. Problem list updated.  Objective:   Filed Vitals:   10/24/15 1251  BP: 107/60  Pulse: 90  Weight: 159 lb 6.4 oz (72.303 kg)    Fetal Status: Fetal Heart Rate (bpm): 140   Movement: Present     General:  Alert, oriented and cooperative. Patient is in no acute distress.  Skin: Skin is warm and dry. No rash noted.   Cardiovascular: Normal heart rate noted  Respiratory: Normal respiratory effort, no problems with respiration noted  Abdomen: Soft, gravid, appropriate for gestational age. Pain/Pressure: Present     Pelvic:  Cervical exam performed       closed thick and high  Extremities: Normal range of motion.  Edema: Trace  Mental Status: Normal mood and affect. Normal behavior. Normal judgment and thought content.   Urinalysis:      Assessment and Plan:  Pregnancy: G2P0010 at [redacted]w[redacted]d  1. Supervision of low-risk pregnancy, third trimester Continue prenatal care.   2. Rh negative status during pregnancy in second trimester, antepartum, not applicable or unspecified fetus Received rhogam  Term labor symptoms and general obstetric precautions including but not limited to vaginal bleeding, contractions, leaking of fluid and fetal movement were reviewed in detail with the patient. Please refer to After Visit Summary for  other counseling recommendations.  Return in about 2 weeks (around 11/07/2015) for LOB.   Waldemar Dickens, MD

## 2015-10-24 NOTE — Patient Instructions (Signed)
Third Trimester of Pregnancy The third trimester is from week 29 through week 42, months 7 through 9. The third trimester is a time when the fetus is growing rapidly. At the end of the ninth month, the fetus is about 20 inches in length and weighs 6-10 pounds.  BODY CHANGES Your body goes through many changes during pregnancy. The changes vary from woman to woman.   Your weight will continue to increase. You can expect to gain 25-35 pounds (11-16 kg) by the end of the pregnancy.  You may begin to get stretch marks on your hips, abdomen, and breasts.  You may urinate more often because the fetus is moving lower into your pelvis and pressing on your bladder.  You may develop or continue to have heartburn as a result of your pregnancy.  You may develop constipation because certain hormones are causing the muscles that push waste through your intestines to slow down.  You may develop hemorrhoids or swollen, bulging veins (varicose veins).  You may have pelvic pain because of the weight gain and pregnancy hormones relaxing your joints between the bones in your pelvis. Backaches may result from overexertion of the muscles supporting your posture.  You may have changes in your hair. These can include thickening of your hair, rapid growth, and changes in texture. Some women also have hair loss during or after pregnancy, or hair that feels dry or thin. Your hair will most likely return to normal after your baby is born.  Your breasts will continue to grow and be tender. A yellow discharge may leak from your breasts called colostrum.  Your belly button may stick out.  You may feel short of breath because of your expanding uterus.  You may notice the fetus "dropping," or moving lower in your abdomen.  You may have a bloody mucus discharge. This usually occurs a few days to a week before labor begins.  Your cervix becomes thin and soft (effaced) near your due date. WHAT TO EXPECT AT YOUR  PRENATAL EXAMS  You will have prenatal exams every 2 weeks until week 36. Then, you will have weekly prenatal exams. During a routine prenatal visit:  You will be weighed to make sure you and the fetus are growing normally.  Your blood pressure is taken.  Your abdomen will be measured to track your baby's growth.  The fetal heartbeat will be listened to.  Any test results from the previous visit will be discussed.  You may have a cervical check near your due date to see if you have effaced. At around 36 weeks, your caregiver will check your cervix. At the same time, your caregiver will also perform a test on the secretions of the vaginal tissue. This test is to determine if a type of bacteria, Group B streptococcus, is present. Your caregiver will explain this further. Your caregiver may ask you:  What your birth plan is.  How you are feeling.  If you are feeling the baby move.  If you have had any abnormal symptoms, such as leaking fluid, bleeding, severe headaches, or abdominal cramping.  If you are using any tobacco products, including cigarettes, chewing tobacco, and electronic cigarettes.  If you have any questions. Other tests or screenings that may be performed during your third trimester include:  Blood tests that check for low iron levels (anemia).  Fetal testing to check the health, activity level, and growth of the fetus. Testing is done if you have certain medical conditions or if   there are problems during the pregnancy.  HIV (human immunodeficiency virus) testing. If you are at high risk, you may be screened for HIV during your third trimester of pregnancy. FALSE LABOR You may feel small, irregular contractions that eventually go away. These are called Braxton Hicks contractions, or false labor. Contractions may last for hours, days, or even weeks before true labor sets in. If contractions come at regular intervals, intensify, or become painful, it is best to be seen  by your caregiver.  SIGNS OF LABOR   Menstrual-like cramps.  Contractions that are 5 minutes apart or less.  Contractions that start on the top of the uterus and spread down to the lower abdomen and back.  A sense of increased pelvic pressure or back pain.  A watery or bloody mucus discharge that comes from the vagina. If you have any of these signs before the 37th week of pregnancy, call your caregiver right away. You need to go to the hospital to get checked immediately. HOME CARE INSTRUCTIONS   Avoid all smoking, herbs, alcohol, and unprescribed drugs. These chemicals affect the formation and growth of the baby.  Do not use any tobacco products, including cigarettes, chewing tobacco, and electronic cigarettes. If you need help quitting, ask your health care provider. You may receive counseling support and other resources to help you quit.  Follow your caregiver's instructions regarding medicine use. There are medicines that are either safe or unsafe to take during pregnancy.  Exercise only as directed by your caregiver. Experiencing uterine cramps is a good sign to stop exercising.  Continue to eat regular, healthy meals.  Wear a good support bra for breast tenderness.  Do not use hot tubs, steam rooms, or saunas.  Wear your seat belt at all times when driving.  Avoid raw meat, uncooked cheese, cat litter boxes, and soil used by cats. These carry germs that can cause birth defects in the baby.  Take your prenatal vitamins.  Take 1500-2000 mg of calcium daily starting at the 20th week of pregnancy until you deliver your baby.  Try taking a stool softener (if your caregiver approves) if you develop constipation. Eat more high-fiber foods, such as fresh vegetables or fruit and whole grains. Drink plenty of fluids to keep your urine clear or pale yellow.  Take warm sitz baths to soothe any pain or discomfort caused by hemorrhoids. Use hemorrhoid cream if your caregiver  approves.  If you develop varicose veins, wear support hose. Elevate your feet for 15 minutes, 3-4 times a day. Limit salt in your diet.  Avoid heavy lifting, wear low heal shoes, and practice good posture.  Rest a lot with your legs elevated if you have leg cramps or low back pain.  Visit your dentist if you have not gone during your pregnancy. Use a soft toothbrush to brush your teeth and be gentle when you floss.  A sexual relationship may be continued unless your caregiver directs you otherwise.  Do not travel far distances unless it is absolutely necessary and only with the approval of your caregiver.  Take prenatal classes to understand, practice, and ask questions about the labor and delivery.  Make a trial run to the hospital.  Pack your hospital bag.  Prepare the baby's nursery.  Continue to go to all your prenatal visits as directed by your caregiver. SEEK MEDICAL CARE IF:  You are unsure if you are in labor or if your water has broken.  You have dizziness.  You have   mild pelvic cramps, pelvic pressure, or nagging pain in your abdominal area.  You have persistent nausea, vomiting, or diarrhea.  You have a bad smelling vaginal discharge.  You have pain with urination. SEEK IMMEDIATE MEDICAL CARE IF:   You have a fever.  You are leaking fluid from your vagina.  You have spotting or bleeding from your vagina.  You have severe abdominal cramping or pain.  You have rapid weight loss or gain.  You have shortness of breath with chest pain.  You notice sudden or extreme swelling of your face, hands, ankles, feet, or legs.  You have not felt your baby move in over an hour.  You have severe headaches that do not go away with medicine.  You have vision changes.   This information is not intended to replace advice given to you by your health care provider. Make sure you discuss any questions you have with your health care provider.   Document Released:  03/20/2001 Document Revised: 04/16/2014 Document Reviewed: 05/27/2012 Elsevier Interactive Patient Education 2016 [employer]  Breastfeeding Deciding to breastfeed is one of the best choices you can make for you and your baby. A change in hormones during pregnancy causes your breast tissue to grow and increases the number and size of your milk ducts. These hormones also allow proteins, sugars, and fats from your blood supply to make breast milk in your milk-producing glands. Hormones prevent breast milk from being released before your baby is born as well as prompt milk flow after birth. Once breastfeeding has begun, thoughts of your baby, as well as his or her sucking or crying, can stimulate the release of milk from your milk-producing glands.  BENEFITS OF BREASTFEEDING For Your Baby  Your first milk (colostrum) helps your baby's digestive system function better.  There are antibodies in your milk that help your baby fight off infections.  Your baby has a lower incidence of asthma, allergies, and sudden infant death syndrome.  The nutrients in breast milk are better for your baby than infant formulas and are designed uniquely for your baby's needs.  Breast milk improves your baby's brain development.  Your baby is less likely to develop other conditions, such as childhood obesity, asthma, or type 2 diabetes mellitus. For You  Breastfeeding helps to create a very special bond between you and your baby.  Breastfeeding is convenient. Breast milk is always available at the correct temperature and costs nothing.  Breastfeeding helps to burn calories and helps you lose the weight gained during pregnancy.  Breastfeeding makes your uterus contract to its prepregnancy size faster and slows bleeding (lochia) after you give birth.   Breastfeeding helps to lower your risk of developing type 2 diabetes mellitus, osteoporosis, and breast or ovarian cancer later in life. SIGNS THAT YOUR BABY IS  HUNGRY Early Signs of Hunger  Increased alertness or activity.  Stretching.  Movement of the head from side to side.  Movement of the head and opening of the mouth when the corner of the mouth or cheek is stroked (rooting).  Increased sucking sounds, smacking lips, cooing, sighing, or squeaking.  Hand-to-mouth movements.  Increased sucking of fingers or hands. Late Signs of Hunger  Fussing.  Intermittent crying. Extreme Signs of Hunger Signs of extreme hunger will require calming and consoling before your baby will be able to breastfeed successfully. Do not wait for the following signs of extreme hunger to occur before you initiate breastfeeding:  Restlessness.  A loud, strong cry.  Screaming.   BREASTFEEDING BASICS Breastfeeding Initiation  Find a comfortable place to sit or lie down, with your neck and back well supported.  Place a pillow or rolled up blanket under your baby to bring him or her to the level of your breast (if you are seated). Nursing pillows are specially designed to help support your arms and your baby while you breastfeed.  Make sure that your baby's abdomen is facing your abdomen.  Gently massage your breast. With your fingertips, massage from your chest wall toward your nipple in a circular motion. This encourages milk flow. You may need to continue this action during the feeding if your milk flows slowly.  Support your breast with 4 fingers underneath and your thumb above your nipple. Make sure your fingers are well away from your nipple and your baby's mouth.  Stroke your baby's lips gently with your finger or nipple.  When your baby's mouth is open wide enough, quickly bring your baby to your breast, placing your entire nipple and as much of the colored area around your nipple (areola) as possible into your baby's mouth.  More areola should be visible above your baby's upper lip than below the lower lip.  Your baby's tongue should be between his  or her lower gum and your breast.  Ensure that your baby's mouth is correctly positioned around your nipple (latched). Your baby's lips should create a seal on your breast and be turned out (everted).  It is common for your baby to suck about 2-3 minutes in order to start the flow of breast milk. Latching Teaching your baby how to latch on to your breast properly is very important. An improper latch can cause nipple pain and decreased milk supply for you and poor weight gain in your baby. Also, if your baby is not latched onto your nipple properly, he or she may swallow some air during feeding. This can make your baby fussy. Burping your baby when you switch breasts during the feeding can help to get rid of the air. However, teaching your baby to latch on properly is still the best way to prevent fussiness from swallowing air while breastfeeding. Signs that your baby has successfully latched on to your nipple:  Silent tugging or silent sucking, without causing you pain.  Swallowing heard between every 3-4 sucks.  Muscle movement above and in front of his or her ears while sucking. Signs that your baby has not successfully latched on to nipple:  Sucking sounds or smacking sounds from your baby while breastfeeding.  Nipple pain. If you think your baby has not latched on correctly, slip your finger into the corner of your baby's mouth to break the suction and place it between your baby's gums. Attempt breastfeeding initiation again. Signs of Successful Breastfeeding Signs from your baby:  A gradual decrease in the number of sucks or complete cessation of sucking.  Falling asleep.  Relaxation of his or her body.  Retention of a small amount of milk in his or her mouth.  Letting go of your breast by himself or herself. Signs from you:  Breasts that have increased in firmness, weight, and size 1-3 hours after feeding.  Breasts that are softer immediately after  breastfeeding.  Increased milk volume, as well as a change in milk consistency and color by the fifth day of breastfeeding.  Nipples that are not sore, cracked, or bleeding. Signs That Your Baby is Getting Enough Milk  Wetting at least 3 diapers in a 24-hour period.   The urine should be clear and pale yellow by age 5 days.  At least 3 stools in a 24-hour period by age 5 days. The stool should be soft and yellow.  At least 3 stools in a 24-hour period by age 7 days. The stool should be seedy and yellow.  No loss of weight greater than 10% of birth weight during the first 3 days of age.  Average weight gain of 4-7 ounces (113-198 g) per week after age 4 days.  Consistent daily weight gain by age 5 days, without weight loss after the age of 2 weeks. After a feeding, your baby may spit up a small amount. This is common. BREASTFEEDING FREQUENCY AND DURATION Frequent feeding will help you make more milk and can prevent sore nipples and breast engorgement. Breastfeed when you feel the need to reduce the fullness of your breasts or when your baby shows signs of hunger. This is called "breastfeeding on demand." Avoid introducing a pacifier to your baby while you are working to establish breastfeeding (the first 4-6 weeks after your baby is born). After this time you may choose to use a pacifier. Research has shown that pacifier use during the first year of a baby's life decreases the risk of sudden infant death syndrome (SIDS). Allow your baby to feed on each breast as long as he or she wants. Breastfeed until your baby is finished feeding. When your baby unlatches or falls asleep while feeding from the first breast, offer the second breast. Because newborns are often sleepy in the first few weeks of life, you may need to awaken your baby to get him or her to feed. Breastfeeding times will vary from baby to baby. However, the following rules can serve as a guide to help you ensure that your baby is  properly fed:  Newborns (babies 4 weeks of age or younger) may breastfeed every 1-3 hours.  Newborns should not go longer than 3 hours during the day or 5 hours during the night without breastfeeding.  You should breastfeed your baby a minimum of 8 times in a 24-hour period until you begin to introduce solid foods to your baby at around 6 months of age. BREAST MILK PUMPING Pumping and storing breast milk allows you to ensure that your baby is exclusively fed your breast milk, even at times when you are unable to breastfeed. This is especially important if you are going back to work while you are still breastfeeding or when you are not able to be present during feedings. Your lactation consultant can give you guidelines on how long it is safe to store breast milk. A breast pump is a machine that allows you to pump milk from your breast into a sterile bottle. The pumped breast milk can then be stored in a refrigerator or freezer. Some breast pumps are operated by hand, while others use electricity. Ask your lactation consultant which type will work best for you. Breast pumps can be purchased, but some hospitals and breastfeeding support groups lease breast pumps on a monthly basis. A lactation consultant can teach you how to hand express breast milk, if you prefer not to use a pump. CARING FOR YOUR BREASTS WHILE YOU BREASTFEED Nipples can become dry, cracked, and sore while breastfeeding. The following recommendations can help keep your breasts moisturized and healthy:  Avoid using soap on your nipples.  Wear a supportive bra. Although not required, special nursing bras and tank tops are designed to allow access to your   breasts for breastfeeding without taking off your entire bra or top. Avoid wearing underwire-style bras or extremely tight bras.  Air dry your nipples for 3-4minutes after each feeding.  Use only cotton bra pads to absorb leaked breast milk. Leaking of breast milk between feedings  is normal.  Use lanolin on your nipples after breastfeeding. Lanolin helps to maintain your skin's normal moisture barrier. If you use pure lanolin, you do not need to wash it off before feeding your baby again. Pure lanolin is not toxic to your baby. You may also hand express a few drops of breast milk and gently massage that milk into your nipples and allow the milk to air dry. In the first few weeks after giving birth, some women experience extremely full breasts (engorgement). Engorgement can make your breasts feel heavy, warm, and tender to the touch. Engorgement peaks within 3-5 days after you give birth. The following recommendations can help ease engorgement:  Completely empty your breasts while breastfeeding or pumping. You may want to start by applying warm, moist heat (in the shower or with warm water-soaked hand towels) just before feeding or pumping. This increases circulation and helps the milk flow. If your baby does not completely empty your breasts while breastfeeding, pump any extra milk after he or she is finished.  Wear a snug bra (nursing or regular) or tank top for 1-2 days to signal your body to slightly decrease milk production.  Apply ice packs to your breasts, unless this is too uncomfortable for you.  Make sure that your baby is latched on and positioned properly while breastfeeding. If engorgement persists after 48 hours of following these recommendations, contact your health care provider or a lactation consultant. OVERALL HEALTH CARE RECOMMENDATIONS WHILE BREASTFEEDING  Eat healthy foods. Alternate between meals and snacks, eating 3 of each per day. Because what you eat affects your breast milk, some of the foods may make your baby more irritable than usual. Avoid eating these foods if you are sure that they are negatively affecting your baby.  Drink milk, fruit juice, and water to satisfy your thirst (about 10 glasses a day).  Rest often, relax, and continue to take  your prenatal vitamins to prevent fatigue, stress, and anemia.  Continue breast self-awareness checks.  Avoid chewing and smoking tobacco. Chemicals from cigarettes that pass into breast milk and exposure to secondhand smoke may harm your baby.  Avoid alcohol and drug use, including marijuana. Some medicines that may be harmful to your baby can pass through breast milk. It is important to ask your health care provider before taking any medicine, including all over-the-counter and prescription medicine as well as vitamin and herbal supplements. It is possible to become pregnant while breastfeeding. If birth control is desired, ask your health care provider about options that will be safe for your baby. SEEK MEDICAL CARE IF:  You feel like you want to stop breastfeeding or have become frustrated with breastfeeding.  You have painful breasts or nipples.  Your nipples are cracked or bleeding.  Your breasts are red, tender, or warm.  You have a swollen area on either breast.  You have a fever or chills.  You have nausea or vomiting.  You have drainage other than breast milk from your nipples.  Your breasts do not become full before feedings by the fifth day after you give birth.  You feel sad and depressed.  Your baby is too sleepy to eat well.  Your baby is having trouble sleeping.     Your baby is wetting less than 3 diapers in a 24-hour period.  Your baby has less than 3 stools in a 24-hour period.  Your baby's skin or the white part of his or her eyes becomes yellow.   Your baby is not gaining weight by 5 days of age. SEEK IMMEDIATE MEDICAL CARE IF:  Your baby is overly tired (lethargic) and does not want to wake up and feed.  Your baby develops an unexplained fever.   This information is not intended to replace advice given to you by your health care provider. Make sure you discuss any questions you have with your health care provider.   Document Released: 03/26/2005  Document Revised: 12/15/2014 Document Reviewed: 09/17/2012 Elsevier Interactive Patient Education 2016 [employer]  

## 2015-10-28 LAB — OB RESULTS CONSOLE GBS: GBS: NEGATIVE

## 2015-11-07 ENCOUNTER — Ambulatory Visit (INDEPENDENT_AMBULATORY_CARE_PROVIDER_SITE_OTHER): Payer: 59 | Admitting: Obstetrics and Gynecology

## 2015-11-07 VITALS — BP 93/69 | HR 82 | Wt 162.6 lb

## 2015-11-07 DIAGNOSIS — Z113 Encounter for screening for infections with a predominantly sexual mode of transmission: Secondary | ICD-10-CM

## 2015-11-07 DIAGNOSIS — O360120 Maternal care for anti-D [Rh] antibodies, second trimester, not applicable or unspecified: Secondary | ICD-10-CM

## 2015-11-07 DIAGNOSIS — Z3493 Encounter for supervision of normal pregnancy, unspecified, third trimester: Secondary | ICD-10-CM

## 2015-11-07 LAB — POCT URINALYSIS DIP (DEVICE)
Bilirubin Urine: NEGATIVE
Glucose, UA: NEGATIVE mg/dL
Hgb urine dipstick: NEGATIVE
Ketones, ur: 15 mg/dL — AB
Leukocytes, UA: NEGATIVE
Nitrite: NEGATIVE
Protein, ur: 30 mg/dL — AB
Specific Gravity, Urine: 1.025 (ref 1.005–1.030)
Urobilinogen, UA: 1 mg/dL (ref 0.0–1.0)
pH: 6.5 (ref 5.0–8.0)

## 2015-11-07 LAB — OB RESULTS CONSOLE GBS: GBS: NEGATIVE

## 2015-11-07 NOTE — Progress Notes (Signed)
Prenatal Visit Note Date: 11/07/2015 Clinic: [facility]  Subjective:  Deborah Gibbs is a 25 y.o. G2P0010 at [redacted]w[redacted]d being seen today for ongoing prenatal care.  She is currently monitored for the following issues for this low-risk pregnancy and has Supervision of low-risk pregnancy and Rh negative status during pregnancy in second trimester, antepartum on her problem list.  Patient reports no complaints.   Contractions: Not present. Vag. Bleeding: None.  Movement: Present. Denies leaking of fluid.   The following portions of the patient's history were reviewed and updated as appropriate: allergies, current medications, past family history, past medical history, past social history, past surgical history and problem list. Problem list updated.  Objective:   Vitals:   11/07/15 1601  BP: 93/69  Pulse: 82  Weight: 162 lb 9.6 oz (73.8 kg)    Fetal Status: Fetal Heart Rate (bpm): 138   Movement: Present     General:  Alert, oriented and cooperative. Patient is in no acute distress.  Skin: Skin is warm and dry. No rash noted.   Cardiovascular: Normal heart rate noted  Respiratory: Normal respiratory effort, no problems with respiration noted  Abdomen: Soft, gravid, appropriate for gestational age. Pain/Pressure: Present     Pelvic:  Cervical exam deferred        Extremities: Normal range of motion.  Edema: None  Mental Status: Normal mood and affect. Normal behavior. Normal judgment and thought content.   Urinalysis:      Assessment and Plan:  Pregnancy: G2P0010 at [redacted]w[redacted]d  1. Supervision of low-risk pregnancy, third trimester Routine care. Patient states took PCN in her teens and had hives and some swelling but unsure if any throat swelling or SOB - Culture, Grp B Strep w/Rflx Suscept - GC/Chlamydia probe amp ([place])not at [facility]  2. Rh negative status during pregnancy in second trimester, antepartum, not applicable or unspecified fetus S/p rhogam  Preterm  labor symptoms and general obstetric precautions including but not limited to vaginal bleeding, contractions, leaking of fluid and fetal movement were reviewed in detail with the patient. Please refer to After Visit Summary for other counseling recommendations.  Return in about 1 week (around 11/14/2015).   Aletha Halim, MD

## 2015-11-07 NOTE — Progress Notes (Signed)
Cultures?

## 2015-11-08 LAB — GC/CHLAMYDIA PROBE AMP (~~LOC~~) NOT AT ARMC
Chlamydia: NEGATIVE
Neisseria Gonorrhea: NEGATIVE

## 2015-11-09 ENCOUNTER — Telehealth: Payer: Self-pay | Admitting: *Deleted

## 2015-11-09 NOTE — Telephone Encounter (Signed)
Received message left on nurse voicemail on 11/08/15 at 0953.  Patient states she is having trouble sleeping.  Would like to know if we could call in a prescription for a sleep aid.  Requests a return call to [phone].

## 2015-11-10 ENCOUNTER — Encounter: Payer: Self-pay | Admitting: Obstetrics and Gynecology

## 2015-11-10 LAB — CULTURE, STREPTOCOCCUS GRP B W/SUSCEPT

## 2015-11-10 NOTE — Telephone Encounter (Signed)
Spoke with Dr. Ilda Basset about patient. He recommended Melatonin otc or sleepy time tea. Pt is allergic to benadryl. Called patient and informed her. She agrees and has no further questions.

## 2015-11-24 ENCOUNTER — Encounter: Payer: Self-pay | Admitting: Family

## 2015-11-25 ENCOUNTER — Telehealth: Payer: Self-pay | Admitting: *Deleted

## 2015-11-25 NOTE — Telephone Encounter (Signed)
Albie called early this am and left a message stating she is [redacted] weeks pregnant and is calling because she is concerned but not too worried. States she is having pressure and tingling feeling but not contracting and is not sure if there is something she should be looking out for.   I called [place] and she denies contractions, states she feels the baby moving well. She c/o pelvic pressure and sort of tingling in her privates.  She denies any vaginal bleeding , only having usual white discharge.  We discussed it is normal at this stage to have pelvic pressure and tingling is ok- discussed signs of labor and fetal distress and when to report to [facility]. We discussed she missed her appt yesterday- she states she was not aware of it. I asked her if she could come in on Monday and she agreed to a 12:40 appt.

## 2015-11-28 ENCOUNTER — Ambulatory Visit (INDEPENDENT_AMBULATORY_CARE_PROVIDER_SITE_OTHER): Payer: 59 | Admitting: Medical

## 2015-11-28 DIAGNOSIS — Z3493 Encounter for supervision of normal pregnancy, unspecified, third trimester: Secondary | ICD-10-CM

## 2015-11-28 LAB — POCT URINALYSIS DIP (DEVICE)
Bilirubin Urine: NEGATIVE
Glucose, UA: NEGATIVE mg/dL
Ketones, ur: NEGATIVE mg/dL
Leukocytes, UA: NEGATIVE
Nitrite: NEGATIVE
Protein, ur: NEGATIVE mg/dL
Specific Gravity, Urine: 1.02 (ref 1.005–1.030)
Urobilinogen, UA: 0.2 mg/dL (ref 0.0–1.0)
pH: 6 (ref 5.0–8.0)

## 2015-11-28 NOTE — Progress Notes (Signed)
Subjective:  Deborah Gibbs is a 25 y.o. G2P0010 at [redacted]w[redacted]d being seen today for ongoing prenatal care.  She is currently monitored for the following issues for this low-risk pregnancy and has Supervision of low-risk pregnancy and Rh negative status during pregnancy in second trimester, antepartum on her problem list.  Patient reports no complaints.  Contractions: Irritability. Vag. Bleeding: None.  Movement: Present. Denies leaking of fluid.   The following portions of the patient's history were reviewed and updated as appropriate: allergies, current medications, past family history, past medical history, past social history, past surgical history and problem list. Problem list updated.  Objective:   Vitals:   11/28/15 1255  BP: 108/68  Pulse: 85  Weight: 161 lb 14.4 oz (73.4 kg)    Fetal Status: Fetal Heart Rate (bpm): 136 Fundal Height: 39 cm Movement: Present  Presentation: Vertex  General:  Alert, oriented and cooperative. Patient is in no acute distress.  Skin: Skin is warm and dry. No rash noted.   Cardiovascular: Normal heart rate noted  Respiratory: Normal respiratory effort, no problems with respiration noted  Abdomen: Soft, gravid, appropriate for gestational age. Pain/Pressure: Absent     Pelvic:  Cervical exam performed Dilation: Closed Effacement (%): 50 Station: -3  Extremities: Normal range of motion.  Edema: Trace  Mental Status: Normal mood and affect. Normal behavior. Normal judgment and thought content.   Urinalysis: Urine Protein: Negative Urine Glucose: Negative  Assessment and Plan:  Pregnancy: G2P0010 at [redacted]w[redacted]d  1. Supervision of low-risk pregnancy, third trimester - GBS - negative  Term labor symptoms and general obstetric precautions including but not limited to vaginal bleeding, contractions, leaking of fluid and fetal movement were reviewed in detail with the patient. Please refer to After Visit Summary for other counseling recommendations.  Return in about  1 week (around 12/05/2015) for LOB.   Luvenia Redden, PA-C

## 2015-11-28 NOTE — Patient Instructions (Signed)
Fetal Movement Counts Patient Name: __________________________________________________ Patient Due Date: ____________________ Performing a fetal movement count is highly recommended in high-risk pregnancies, but it is good for every pregnant woman to do. Your health care provider may ask you to start counting fetal movements at 28 weeks of the pregnancy. Fetal movements often increase:  After eating a full meal.  After physical activity.  After eating or drinking something sweet or cold.  At rest. Pay attention to when you feel the baby is most active. This will help you notice a pattern of your baby's sleep and wake cycles and what factors contribute to an increase in fetal movement. It is important to perform a fetal movement count at the same time each day when your baby is normally most active.  HOW TO COUNT FETAL MOVEMENTS 1. Find a quiet and comfortable area to sit or lie down on your left side. Lying on your left side provides the best blood and oxygen circulation to your baby. 2. Write down the day and time on a sheet of paper or in a journal. 3. Start counting kicks, flutters, swishes, rolls, or jabs in a 2-hour period. You should feel at least 10 movements within 2 hours. 4. If you do not feel 10 movements in 2 hours, wait 2-3 hours and count again. Look for a change in the pattern or not enough counts in 2 hours. SEEK MEDICAL CARE IF:  You feel less than 10 counts in 2 hours, tried twice.  There is no movement in over an hour.  The pattern is changing or taking longer each day to reach 10 counts in 2 hours.  You feel the baby is not moving as he or she usually does. Date: ____________ Movements: ____________ Start time: ____________ Elizebeth Koller time: ____________  Date: ____________ Movements: ____________ Start time: ____________ Elizebeth Koller time: ____________ Date: ____________ Movements: ____________ Start time: ____________ Elizebeth Koller time: ____________ Date: ____________ Movements:  ____________ Start time: ____________ Elizebeth Koller time: ____________ Date: ____________ Movements: ____________ Start time: ____________ Elizebeth Koller time: ____________ Date: ____________ Movements: ____________ Start time: ____________ Elizebeth Koller time: ____________ Date: ____________ Movements: ____________ Start time: ____________ Elizebeth Koller time: ____________ Date: ____________ Movements: ____________ Start time: ____________ Elizebeth Koller time: ____________  Date: ____________ Movements: ____________ Start time: ____________ Elizebeth Koller time: ____________ Date: ____________ Movements: ____________ Start time: ____________ Elizebeth Koller time: ____________ Date: ____________ Movements: ____________ Start time: ____________ Elizebeth Koller time: ____________ Date: ____________ Movements: ____________ Start time: ____________ Elizebeth Koller time: ____________ Date: ____________ Movements: ____________ Start time: ____________ Elizebeth Koller time: ____________ Date: ____________ Movements: ____________ Start time: ____________ Elizebeth Koller time: ____________ Date: ____________ Movements: ____________ Start time: ____________ Elizebeth Koller time: ____________  Date: ____________ Movements: ____________ Start time: ____________ Elizebeth Koller time: ____________ Date: ____________ Movements: ____________ Start time: ____________ Elizebeth Koller time: ____________ Date: ____________ Movements: ____________ Start time: ____________ Elizebeth Koller time: ____________ Date: ____________ Movements: ____________ Start time: ____________ Elizebeth Koller time: ____________ Date: ____________ Movements: ____________ Start time: ____________ Elizebeth Koller time: ____________ Date: ____________ Movements: ____________ Start time: ____________ Elizebeth Koller time: ____________ Date: ____________ Movements: ____________ Start time: ____________ Elizebeth Koller time: ____________  Date: ____________ Movements: ____________ Start time: ____________ Elizebeth Koller time: ____________ Date: ____________ Movements: ____________ Start time: ____________ Elizebeth Koller  time: ____________ Date: ____________ Movements: ____________ Start time: ____________ Elizebeth Koller time: ____________ Date: ____________ Movements: ____________ Start time: ____________ Elizebeth Koller time: ____________ Date: ____________ Movements: ____________ Start time: ____________ Elizebeth Koller time: ____________ Date: ____________ Movements: ____________ Start time: ____________ Elizebeth Koller time: ____________ Date: ____________ Movements: ____________ Start time: ____________ Elizebeth Koller time: ____________  Date: ____________ Movements: ____________ Start time: ____________ Elizebeth Koller  time: ____________ Date: ____________ Movements: ____________ Start time: ____________ Elizebeth Koller time: ____________ Date: ____________ Movements: ____________ Start time: ____________ Elizebeth Koller time: ____________ Date: ____________ Movements: ____________ Start time: ____________ Elizebeth Koller time: ____________ Date: ____________ Movements: ____________ Start time: ____________ Elizebeth Koller time: ____________ Date: ____________ Movements: ____________ Start time: ____________ Elizebeth Koller time: ____________ Date: ____________ Movements: ____________ Start time: ____________ Elizebeth Koller time: ____________  Date: ____________ Movements: ____________ Start time: ____________ Elizebeth Koller time: ____________ Date: ____________ Movements: ____________ Start time: ____________ Elizebeth Koller time: ____________ Date: ____________ Movements: ____________ Start time: ____________ Elizebeth Koller time: ____________ Date: ____________ Movements: ____________ Start time: ____________ Elizebeth Koller time: ____________ Date: ____________ Movements: ____________ Start time: ____________ Elizebeth Koller time: ____________ Date: ____________ Movements: ____________ Start time: ____________ Elizebeth Koller time: ____________ Date: ____________ Movements: ____________ Start time: ____________ Elizebeth Koller time: ____________  Date: ____________ Movements: ____________ Start time: ____________ Elizebeth Koller time: ____________ Date: ____________  Movements: ____________ Start time: ____________ Elizebeth Koller time: ____________ Date: ____________ Movements: ____________ Start time: ____________ Elizebeth Koller time: ____________ Date: ____________ Movements: ____________ Start time: ____________ Elizebeth Koller time: ____________ Date: ____________ Movements: ____________ Start time: ____________ Elizebeth Koller time: ____________ Date: ____________ Movements: ____________ Start time: ____________ Elizebeth Koller time: ____________ Date: ____________ Movements: ____________ Start time: ____________ Elizebeth Koller time: ____________  Date: ____________ Movements: ____________ Start time: ____________ Elizebeth Koller time: ____________ Date: ____________ Movements: ____________ Start time: ____________ Elizebeth Koller time: ____________ Date: ____________ Movements: ____________ Start time: ____________ Elizebeth Koller time: ____________ Date: ____________ Movements: ____________ Start time: ____________ Elizebeth Koller time: ____________ Date: ____________ Movements: ____________ Start time: ____________ Elizebeth Koller time: ____________ Date: ____________ Movements: ____________ Start time: ____________ Elizebeth Koller time: ____________   This information is not intended to replace advice given to you by your health care provider. Make sure you discuss any questions you have with your health care provider.   Document Released: 04/25/2006 Document Revised: 04/16/2014 Document Reviewed: 01/21/2012 Elsevier Interactive Patient Education 2016 [employer] of the uterus can occur throughout pregnancy. Contractions are not always a sign that you are in labor.  WHAT ARE BRAXTON HICKS CONTRACTIONS?  Contractions that occur before labor are called Braxton Hicks contractions, or false labor. Toward the end of pregnancy (32-34 weeks), these contractions can develop more often and may become more forceful. This is not true labor because these contractions do not result in opening (dilatation) and thinning of  the cervix. They are sometimes difficult to tell apart from true labor because these contractions can be forceful and people have different pain tolerances. You should not feel embarrassed if you go to the hospital with false labor. Sometimes, the only way to tell if you are in true labor is for your health care provider to look for changes in the cervix. If there are no prenatal problems or other health problems associated with the pregnancy, it is completely safe to be sent home with false labor and await the onset of true labor. HOW CAN YOU TELL THE DIFFERENCE BETWEEN TRUE AND FALSE LABOR? False Labor  The contractions of false labor are usually shorter and not as hard as those of true labor.   The contractions are usually irregular.   The contractions are often felt in the front of the lower abdomen and in the groin.   The contractions may go away when you walk around or change positions while lying down.   The contractions get weaker and are shorter lasting as time goes on.   The contractions do not usually become progressively stronger, regular, and closer together as with true labor.  True Labor  Contractions in true  labor last 30-70 seconds, become very regular, usually become more intense, and increase in frequency.   The contractions do not go away with walking.   The discomfort is usually felt in the top of the uterus and spreads to the lower abdomen and low back.   True labor can be determined by your health care provider with an exam. This will show that the cervix is dilating and getting thinner.  WHAT TO REMEMBER  Keep up with your usual exercises and follow other instructions given by your health care provider.   Take medicines as directed by your health care provider.   Keep your regular prenatal appointments.   Eat and drink lightly if you think you are going into labor.   If Braxton Hicks contractions are making you uncomfortable:   Change your  position from lying down or resting to walking, or from walking to resting.   Sit and rest in a tub of warm water.   Drink 2-3 glasses of water. Dehydration may cause these contractions.   Do slow and deep breathing several times an hour.  WHEN SHOULD I SEEK IMMEDIATE MEDICAL CARE? Seek immediate medical care if:  Your contractions become stronger, more regular, and closer together.   You have fluid leaking or gushing from your vagina.   You have a fever.   You pass blood-tinged mucus.   You have vaginal bleeding.   You have continuous abdominal pain.   You have low back pain that you never had before.   You feel your baby's head pushing down and causing pelvic pressure.   Your baby is not moving as much as it used to.    This information is not intended to replace advice given to you by your health care provider. Make sure you discuss any questions you have with your health care provider.   Document Released: 03/26/2005 Document Revised: 03/31/2013 Document Reviewed: 01/05/2013 Elsevier Interactive Patient Education [employer].

## 2015-11-28 NOTE — Progress Notes (Signed)
Cultures today 

## 2015-11-29 LAB — GC/CHLAMYDIA PROBE AMP (~~LOC~~) NOT AT ARMC
Chlamydia: NEGATIVE
Neisseria Gonorrhea: NEGATIVE

## 2015-12-03 ENCOUNTER — Inpatient Hospital Stay (HOSPITAL_COMMUNITY)
Admission: AD | Admit: 2015-12-03 | Discharge: 2015-12-03 | Disposition: A | Discharge status: Home / Self Care | Payer: 59 | Source: Ambulatory Visit | Attending: Obstetrics & Gynecology | Admitting: Obstetrics & Gynecology

## 2015-12-03 ENCOUNTER — Encounter (HOSPITAL_COMMUNITY): Payer: Self-pay | Admitting: Certified Nurse Midwife

## 2015-12-03 DIAGNOSIS — N898 Other specified noninflammatory disorders of vagina: Secondary | ICD-10-CM

## 2015-12-03 DIAGNOSIS — Z3A39 39 weeks gestation of pregnancy: Secondary | ICD-10-CM | POA: Insufficient documentation

## 2015-12-03 DIAGNOSIS — O26893 Other specified pregnancy related conditions, third trimester: Secondary | ICD-10-CM

## 2015-12-03 DIAGNOSIS — O471 False labor at or after 37 completed weeks of gestation: Secondary | ICD-10-CM

## 2015-12-03 LAB — AMNISURE RUPTURE OF MEMBRANE (ROM) NOT AT ARMC: Amnisure ROM: NEGATIVE

## 2015-12-03 NOTE — Progress Notes (Signed)
S: Pt here with c/o leaking fluid since yesterday morning. Has not had to wear pad or liner, baby not moving as much as usual.   O: NST reactive: Occasional contractions, mild, pt unaware Cat: 1. FHT 127. Moderate variability, +accel -decel SVE: By RN 1/thick/posterior/-3  No fluid seen, Amnisure obtained Fetal movement noted and felt by Pt.   A: 25yo G2P0010 @ 39 5/7  P: D/C home Labor precautions reviewed Fetal kick counts discussed

## 2015-12-03 NOTE — [facility] Note (Signed)
Pt states she has been leaking fluid since yesterday afternoon. Pt denies ctxs but states she has some pelvic pressure. Denies vaginal bleeding. Pt states baby is not moving as usual.

## 2015-12-03 NOTE — Discharge Instructions (Signed)
Vaginal Delivery °During delivery, your health care provider will help you give birth to your baby. During a vaginal delivery, you will work to push the baby out of your vagina. However, before you can push your baby out, a few things need to happen. The opening of your uterus (cervix) has to soften, thin out, and open up (dilate) all the way to 10 cm. Also, your baby has to move down from the uterus into your vagina.  °SIGNS OF LABOR  °Your health care provider will first need to make sure you are in labor. Signs of labor include:  °· Passing what is called the mucous plug before labor begins. This is a small amount of blood-stained mucus. °· Having regular, painful uterine contractions.   °· The time between contractions gets shorter.   °· The discomfort and pain gradually get more intense. °· Contraction pains get worse when walking and do not go away when resting.   °· Your cervix becomes thinner (effacement) and dilates. °BEFORE THE DELIVERY °Once you are in labor and admitted into the hospital or care center, your health care provider may do the following:  °· Perform a complete physical exam. °· Review any complications related to pregnancy or labor.  °· Check your blood pressure, pulse, temperature, and heart rate (vital signs).   °· Determine if, and when, the rupture of amniotic membranes occurred. °· Do a vaginal exam (using a sterile glove and lubricant) to determine:   °¨ The position (presentation) of the baby. Is the baby's head presenting first (vertex) in the birth canal (vagina), or are the feet or buttocks first (breech)?   °¨ The level (station) of the baby's head within the birth canal.   °¨ The effacement and dilatation of the cervix.   °· An electronic fetal monitor is usually placed on your abdomen when you first arrive. This is used to monitor your contractions and the baby's heart rate. °¨ When the monitor is on your abdomen (external fetal monitor), it can only pick up the frequency and  length of your contractions. It cannot tell the strength of your contractions. °¨ If it becomes necessary for your health care provider to know exactly how strong your contractions are or to see exactly what the baby's heart rate is doing, an internal monitor may be inserted into your vagina and uterus. Your health care provider will discuss the benefits and risks of using an internal monitor and obtain your permission before inserting the device. °¨ Continuous fetal monitoring may be needed if you have an epidural, are receiving certain medicines (such as oxytocin), or have pregnancy or labor complications. °· An IV access tube may be placed into a vein in your arm to deliver fluids and medicines if necessary. °THREE STAGES OF LABOR AND DELIVERY °Normal labor and delivery is divided into three stages. °First Stage °This stage starts when you begin to contract regularly and your cervix begins to efface and dilate. It ends when your cervix is completely open (fully dilated). The first stage is the longest stage of labor and can last from 3 hours to 15 hours.  °Several methods are available to help with labor pain. You and your health care provider will decide which option is best for you. Options include:  °· Opioid medicines. These are strong pain medicines that you can get through your IV tube or as a shot into your muscle. These medicines lessen pain but do not make it go away completely.  °· Epidural. A medicine is given through a thin tube that   is inserted in your back. The medicine numbs the lower part of your body and prevents any pain in that area. °· Paracervical pain medicine. This is an injection of an anesthetic on each side of your cervix.   °· You may request natural childbirth, which does not involve the use of pain medicines or an epidural during labor and delivery. Instead, you will use other things, such as breathing exercises, to help cope with the pain. °Second Stage °The second stage of labor  begins when your cervix is fully dilated at 10 cm. It continues until you push your baby down through the birth canal and the baby is born. This stage can take only minutes or several hours. °· The location of your baby's head as it moves through the birth canal is reported as a number called a station. If the baby's head has not started its descent, the station is described as being at minus 3 (-3). When your baby's head is at the zero station, it is at the middle of the birth canal and is engaged in the pelvis. The station of your baby helps indicate the progress of the second stage of labor. °· When your baby is born, your health care provider may hold the baby with his or her head lowered to prevent amniotic fluid, mucus, and blood from getting into the baby's lungs. The baby's mouth and nose may be suctioned with a small bulb syringe to remove any additional fluid. °· Your health care provider may then place the baby on your stomach. It is important to keep the baby from getting cold. To do this, the health care provider will dry the baby off, place the baby directly on your skin (with no blankets between you and the baby), and cover the baby with warm, dry blankets.   °· The umbilical cord is cut. °Third Stage °During the third stage of labor, your health care provider will deliver the placenta (afterbirth) and make sure your bleeding is under control. The delivery of the placenta usually takes about 5 minutes but can take up to 30 minutes. After the placenta is delivered, a medicine may be given either by IV or injection to help contract the uterus and control bleeding. If you are planning to breastfeed, you can try to do so now. °After you deliver the placenta, your uterus should contract and get very firm. If your uterus does not remain firm, your health care provider will massage it. This is important because the contraction of the uterus helps cut off bleeding at the site where the placenta was attached  to your uterus. If your uterus does not contract properly and stay firm, you may continue to bleed heavily. If there is a lot of bleeding, medicines may be given to contract the uterus and stop the bleeding.  °  °This information is not intended to replace advice given to you by your health care provider. Make sure you discuss any questions you have with your health care provider. °  °Document Released: 01/03/2008 Document Revised: 04/16/2014 Document Reviewed: 11/21/2011 °Elsevier Interactive Patient Education ©2016 [employer] °Fetal Movement Counts °Patient Name: __________________________________________________ Patient Due Date: ____________________ °Performing a fetal movement count is highly recommended in high-risk pregnancies, but it is good for every pregnant woman to do. Your health care provider may ask you to start counting fetal movements at 28 weeks of the pregnancy. Fetal movements often increase: °· After eating a full meal. °· After physical activity. °· After eating or drinking   something sweet or cold.  At rest. Pay attention to when you feel the baby is most active. This will help you notice a pattern of your baby's sleep and wake cycles and what factors contribute to an increase in fetal movement. It is important to perform a fetal movement count at the same time each day when your baby is normally most active.  HOW TO COUNT FETAL MOVEMENTS 1. Find a quiet and comfortable area to sit or lie down on your left side. Lying on your left side provides the best blood and oxygen circulation to your baby. 2. Write down the day and time on a sheet of paper or in a journal. 3. Start counting kicks, flutters, swishes, rolls, or jabs in a 2-hour period. You should feel at least 10 movements within 2 hours. 4. If you do not feel 10 movements in 2 hours, wait 2-3 hours and count again. Look for a change in the pattern or not enough counts in 2 hours. SEEK MEDICAL CARE IF:  You feel less than 10  counts in 2 hours, tried twice.  There is no movement in over an hour.  The pattern is changing or taking longer each day to reach 10 counts in 2 hours.  You feel the baby is not moving as he or she usually does. Date: ____________ Movements: ____________ Start time: ____________ Deborah Gibbs time: ____________  Date: ____________ Movements: ____________ Start time: ____________ Deborah Gibbs time: ____________ Date: ____________ Movements: ____________ Start time: ____________ Deborah Gibbs time: ____________ Date: ____________ Movements: ____________ Start time: ____________ Deborah Gibbs time: ____________ Date: ____________ Movements: ____________ Start time: ____________ Deborah Gibbs time: ____________ Date: ____________ Movements: ____________ Start time: ____________ Deborah Gibbs time: ____________ Date: ____________ Movements: ____________ Start time: ____________ Deborah Gibbs time: ____________ Date: ____________ Movements: ____________ Start time: ____________ Deborah Gibbs time: ____________  Date: ____________ Movements: ____________ Start time: ____________ Deborah Gibbs time: ____________ Date: ____________ Movements: ____________ Start time: ____________ Deborah Gibbs time: ____________ Date: ____________ Movements: ____________ Start time: ____________ Deborah Gibbs time: ____________ Date: ____________ Movements: ____________ Start time: ____________ Deborah Gibbs time: ____________ Date: ____________ Movements: ____________ Start time: ____________ Deborah Gibbs time: ____________ Date: ____________ Movements: ____________ Start time: ____________ Deborah Gibbs time: ____________ Date: ____________ Movements: ____________ Start time: ____________ Deborah Gibbs time: ____________  Date: ____________ Movements: ____________ Start time: ____________ Deborah Gibbs time: ____________ Date: ____________ Movements: ____________ Start time: ____________ Deborah Gibbs time: ____________ Date: ____________ Movements: ____________ Start time: ____________ Deborah Gibbs time: ____________ Date:  ____________ Movements: ____________ Start time: ____________ Deborah Gibbs time: ____________ Date: ____________ Movements: ____________ Start time: ____________ Deborah Gibbs time: ____________ Date: ____________ Movements: ____________ Start time: ____________ Deborah Gibbs time: ____________ Date: ____________ Movements: ____________ Start time: ____________ Deborah Gibbs time: ____________  Date: ____________ Movements: ____________ Start time: ____________ Deborah Gibbs time: ____________ Date: ____________ Movements: ____________ Start time: ____________ Deborah Gibbs time: ____________ Date: ____________ Movements: ____________ Start time: ____________ Deborah Gibbs time: ____________ Date: ____________ Movements: ____________ Start time: ____________ Deborah Gibbs time: ____________ Date: ____________ Movements: ____________ Start time: ____________ Deborah Gibbs time: ____________ Date: ____________ Movements: ____________ Start time: ____________ Deborah Gibbs time: ____________ Date: ____________ Movements: ____________ Start time: ____________ Deborah Gibbs time: ____________  Date: ____________ Movements: ____________ Start time: ____________ Deborah Gibbs time: ____________ Date: ____________ Movements: ____________ Start time: ____________ Deborah Gibbs time: ____________ Date: ____________ Movements: ____________ Start time: ____________ Deborah Gibbs time: ____________ Date: ____________ Movements: ____________ Start time: ____________ Deborah Gibbs time: ____________ Date: ____________ Movements: ____________ Start time: ____________ Deborah Gibbs time: ____________ Date: ____________ Movements: ____________ Start time: ____________ Deborah Gibbs time: ____________ Date: ____________ Movements: ____________ Start time: ____________ Deborah Gibbs time: ____________  Date: ____________ Movements: ____________ Start  time: ____________ Deborah Gibbs time: ____________ Date: ____________ Movements: ____________ Start time: ____________ Deborah Gibbs time: ____________ Date: ____________ Movements: ____________ Start  time: ____________ Deborah Gibbs time: ____________ Date: ____________ Movements: ____________ Start time: ____________ Deborah Gibbs time: ____________ Date: ____________ Movements: ____________ Start time: ____________ Deborah Gibbs time: ____________ Date: ____________ Movements: ____________ Start time: ____________ Deborah Gibbs time: ____________ Date: ____________ Movements: ____________ Start time: ____________ Deborah Gibbs time: ____________  Date: ____________ Movements: ____________ Start time: ____________ Deborah Gibbs time: ____________ Date: ____________ Movements: ____________ Start time: ____________ Deborah Gibbs time: ____________ Date: ____________ Movements: ____________ Start time: ____________ Deborah Gibbs time: ____________ Date: ____________ Movements: ____________ Start time: ____________ Deborah Gibbs time: ____________ Date: ____________ Movements: ____________ Start time: ____________ Deborah Gibbs time: ____________ Date: ____________ Movements: ____________ Start time: ____________ Deborah Gibbs time: ____________ Date: ____________ Movements: ____________ Start time: ____________ Deborah Gibbs time: ____________  Date: ____________ Movements: ____________ Start time: ____________ Deborah Gibbs time: ____________ Date: ____________ Movements: ____________ Start time: ____________ Deborah Gibbs time: ____________ Date: ____________ Movements: ____________ Start time: ____________ Deborah Gibbs time: ____________ Date: ____________ Movements: ____________ Start time: ____________ Deborah Gibbs time: ____________ Date: ____________ Movements: ____________ Start time: ____________ Deborah Gibbs time: ____________ Date: ____________ Movements: ____________ Start time: ____________ Deborah Gibbs time: ____________   This information is not intended to replace advice given to you by your health care provider. Make sure you discuss any questions you have with your health care provider.   Document Released: 04/25/2006 Document Revised: 04/16/2014 Document Reviewed: 01/21/2012 Elsevier  Interactive Patient Education [employer].

## 2015-12-04 ENCOUNTER — Inpatient Hospital Stay (HOSPITAL_COMMUNITY): Payer: 59 | Admitting: Anesthesiology

## 2015-12-04 ENCOUNTER — Encounter (HOSPITAL_COMMUNITY): Payer: Self-pay

## 2015-12-04 ENCOUNTER — Inpatient Hospital Stay (HOSPITAL_COMMUNITY)
Admission: AD | Admit: 2015-12-04 | Discharge: 2015-12-07 | DRG: 775 | Disposition: A | Discharge status: Home / Self Care | Payer: 59 | Source: Ambulatory Visit | Attending: Family Medicine | Admitting: Family Medicine

## 2015-12-04 DIAGNOSIS — O9902 Anemia complicating childbirth: Secondary | ICD-10-CM | POA: Diagnosis present

## 2015-12-04 DIAGNOSIS — O4202 Full-term premature rupture of membranes, onset of labor within 24 hours of rupture: Principal | ICD-10-CM | POA: Diagnosis present

## 2015-12-04 DIAGNOSIS — Z6791 Unspecified blood type, Rh negative: Secondary | ICD-10-CM

## 2015-12-04 DIAGNOSIS — O4290 Premature rupture of membranes, unspecified as to length of time between rupture and onset of labor, unspecified weeks of gestation: Secondary | ICD-10-CM | POA: Diagnosis present

## 2015-12-04 DIAGNOSIS — O411230 Chorioamnionitis, third trimester, not applicable or unspecified: Secondary | ICD-10-CM | POA: Diagnosis present

## 2015-12-04 DIAGNOSIS — Z3493 Encounter for supervision of normal pregnancy, unspecified, third trimester: Secondary | ICD-10-CM

## 2015-12-04 DIAGNOSIS — O9962 Diseases of the digestive system complicating childbirth: Secondary | ICD-10-CM | POA: Diagnosis present

## 2015-12-04 DIAGNOSIS — D649 Anemia, unspecified: Secondary | ICD-10-CM | POA: Diagnosis present

## 2015-12-04 DIAGNOSIS — Z3A40 40 weeks gestation of pregnancy: Secondary | ICD-10-CM | POA: Diagnosis not present

## 2015-12-04 DIAGNOSIS — Z3A39 39 weeks gestation of pregnancy: Secondary | ICD-10-CM | POA: Diagnosis not present

## 2015-12-04 DIAGNOSIS — Z880 Allergy status to penicillin: Secondary | ICD-10-CM | POA: Diagnosis not present

## 2015-12-04 DIAGNOSIS — K219 Gastro-esophageal reflux disease without esophagitis: Secondary | ICD-10-CM | POA: Diagnosis present

## 2015-12-04 DIAGNOSIS — Z8249 Family history of ischemic heart disease and other diseases of the circulatory system: Secondary | ICD-10-CM | POA: Diagnosis not present

## 2015-12-04 DIAGNOSIS — O26893 Other specified pregnancy related conditions, third trimester: Secondary | ICD-10-CM | POA: Diagnosis present

## 2015-12-04 LAB — CBC
HCT: 23.8 % — ABNORMAL LOW (ref 36.0–46.0)
Hemoglobin: 7.9 g/dL — ABNORMAL LOW (ref 12.0–15.0)
MCH: 26.2 pg (ref 26.0–34.0)
MCHC: 33.2 g/dL (ref 30.0–36.0)
MCV: 78.8 fL (ref 78.0–100.0)
Platelets: 240 10*3/uL (ref 150–400)
RBC: 3.02 MIL/uL — ABNORMAL LOW (ref 3.87–5.11)
RDW: 14 % (ref 11.5–15.5)
WBC: 15.3 10*3/uL — ABNORMAL HIGH (ref 4.0–10.5)

## 2015-12-04 LAB — POCT FERN TEST: POCT Fern Test: POSITIVE

## 2015-12-04 MED ORDER — EPHEDRINE 5 MG/ML INJ
10.0000 mg | INTRAVENOUS | Status: DC | PRN
  Filled 2015-12-04: qty 4

## 2015-12-04 MED ORDER — OXYCODONE-ACETAMINOPHEN 5-325 MG PO TABS
1.0000 | ORAL_TABLET | ORAL | Status: DC | PRN
  Administered 2015-12-05: 1 via ORAL
  Filled 2015-12-04: qty 1

## 2015-12-04 MED ORDER — LACTATED RINGERS IV SOLN
INTRAVENOUS | Status: DC
  Administered 2015-12-04 – 2015-12-05 (×4): via INTRAVENOUS

## 2015-12-04 MED ORDER — FENTANYL CITRATE (PF) 100 MCG/2ML IJ SOLN
50.0000 ug | INTRAMUSCULAR | Status: DC | PRN
  Administered 2015-12-04 (×2): 100 ug via INTRAVENOUS
  Filled 2015-12-04 (×2): qty 2

## 2015-12-04 MED ORDER — LIDOCAINE HCL (PF) 1 % IJ SOLN
30.0000 mL | INTRAMUSCULAR | Status: DC | PRN
  Filled 2015-12-04: qty 30

## 2015-12-04 MED ORDER — FENTANYL 2.5 MCG/ML BUPIVACAINE 1/10 % EPIDURAL INFUSION (WH - ANES)
14.0000 mL/h | INTRAMUSCULAR | Status: DC | PRN
  Administered 2015-12-04 – 2015-12-05 (×3): 14 mL/h via EPIDURAL
  Filled 2015-12-04 (×2): qty 125

## 2015-12-04 MED ORDER — ACETAMINOPHEN 325 MG PO TABS
650.0000 mg | ORAL_TABLET | ORAL | Status: DC | PRN
  Administered 2015-12-04: 650 mg via ORAL
  Filled 2015-12-04 (×2): qty 2

## 2015-12-04 MED ORDER — PHENYLEPHRINE 40 MCG/ML (10ML) SYRINGE FOR IV PUSH (FOR BLOOD PRESSURE SUPPORT)
80.0000 ug | PREFILLED_SYRINGE | INTRAVENOUS | Status: DC | PRN
  Filled 2015-12-04: qty 10
  Filled 2015-12-04: qty 5

## 2015-12-04 MED ORDER — LACTATED RINGERS IV SOLN
500.0000 mL | Freq: Once | INTRAVENOUS | Status: AC
  Administered 2015-12-04: 500 mL via INTRAVENOUS

## 2015-12-04 MED ORDER — SOD CITRATE-CITRIC ACID 500-334 MG/5ML PO SOLN: 30.0000 mL | ORAL | Status: DC | PRN

## 2015-12-04 MED ORDER — PHENYLEPHRINE 40 MCG/ML (10ML) SYRINGE FOR IV PUSH (FOR BLOOD PRESSURE SUPPORT)
80.0000 ug | PREFILLED_SYRINGE | INTRAVENOUS | Status: DC | PRN
  Filled 2015-12-04: qty 5

## 2015-12-04 MED ORDER — LACTATED RINGERS IV SOLN
500.0000 mL | Freq: Once | INTRAVENOUS | Status: AC
  Administered 2015-12-05: 500 mL via INTRAVENOUS

## 2015-12-04 MED ORDER — OXYTOCIN 40 UNITS IN LACTATED RINGERS INFUSION - SIMPLE MED
2.5000 [IU]/h | INTRAVENOUS | Status: DC
  Filled 2015-12-04 (×2): qty 1000

## 2015-12-04 MED ORDER — TERBUTALINE SULFATE 1 MG/ML IJ SOLN
0.2500 mg | Freq: Once | INTRAMUSCULAR | Status: DC | PRN
  Filled 2015-12-04: qty 1

## 2015-12-04 MED ORDER — DIPHENHYDRAMINE HCL 50 MG/ML IJ SOLN: 12.5000 mg | INTRAMUSCULAR | Status: DC | PRN

## 2015-12-04 MED ORDER — LACTATED RINGERS IV SOLN
500.0000 mL | INTRAVENOUS | Status: DC | PRN
  Administered 2015-12-05 (×2): 500 mL via INTRAVENOUS

## 2015-12-04 MED ORDER — OXYCODONE-ACETAMINOPHEN 5-325 MG PO TABS: 2.0000 | ORAL_TABLET | ORAL | Status: DC | PRN

## 2015-12-04 MED ORDER — ONDANSETRON HCL 4 MG/2ML IJ SOLN
4.0000 mg | Freq: Four times a day (QID) | INTRAMUSCULAR | Status: DC | PRN
  Administered 2015-12-04: 4 mg via INTRAVENOUS
  Filled 2015-12-04: qty 2

## 2015-12-04 MED ORDER — OXYTOCIN BOLUS FROM INFUSION
500.0000 mL | Freq: Once | INTRAVENOUS | Status: AC
  Administered 2015-12-05: 500 mL/h via INTRAVENOUS

## 2015-12-04 MED ORDER — OXYTOCIN 40 UNITS IN LACTATED RINGERS INFUSION - SIMPLE MED
1.0000 m[IU]/min | INTRAVENOUS | Status: DC
  Administered 2015-12-04: 2 m[IU]/min via INTRAVENOUS

## 2015-12-04 NOTE — Anesthesia Preprocedure Evaluation (Addendum)
Anesthesia Evaluation  Patient identified by MRN, date of birth, ID band Patient awake    Reviewed: Allergy & Precautions, NPO status , Patient's Chart, lab work & pertinent test results  History of Anesthesia Complications Negative for: history of anesthetic complications  Airway Mallampati: III  TM Distance: >3 FB Neck ROM: Full    Dental  (+) Teeth Intact   Pulmonary neg pulmonary ROS,    Pulmonary exam normal breath sounds clear to auscultation       Cardiovascular (-) hypertensionnegative cardio ROS   Rhythm:Regular Rate:Normal     Neuro/Psych negative neurological ROS     GI/Hepatic Neg liver ROS, GERD  ,  Endo/Other  negative endocrine ROS  Renal/GU negative Renal ROS     Musculoskeletal   Abdominal   Peds  Hematology  (+) Blood dyscrasia, anemia ,   Anesthesia Other Findings   Reproductive/Obstetrics (+) Pregnancy                            Anesthesia Physical Anesthesia Plan  ASA: II  Anesthesia Plan: Epidural   Post-op Pain Management:    Induction:   Airway Management Planned:   Additional Equipment:   Intra-op Plan:   Post-operative Plan:   Informed Consent: I have reviewed the patients History and Physical, chart, labs and discussed the procedure including the risks, benefits and alternatives for the proposed anesthesia with the patient or authorized representative who has indicated his/her understanding and acceptance.     Plan Discussed with:   Anesthesia Plan Comments: (I have discussed risks of neuraxial anesthesia including but not limited to infection, bleeding, nerve injury, back pain, headache, seizures, and failure of block. Patient denies bleeding disorders and is not currently anticoagulated. Labs have been reviewed. Risks and benefits discussed. All patient's questions answered.   Hgb 7.9 Hct 23.8 Platelets 240  Patient has 2 units of pRBCs  typed and crossed.)        Anesthesia Quick Evaluation

## 2015-12-04 NOTE — Progress Notes (Signed)
Deborah Gibbs is a 25 y.o. G2P0010 at [redacted]w[redacted]d by ultrasound admitted for active labor  Subjective:   Objective: BP (!) 119/53   Pulse 74   Temp 98.3 F (36.8 C) (Oral)   Resp 18   Ht 5\' 3"  (1.6 m)   Wt 161 lb (73 kg)   LMP 03/10/2015   SpO2 100%   BMI 28.52 kg/m  No intake/output data recorded. No intake/output data recorded.  FHT:  FHR: 130's bpm, variability: moderate,  accelerations:  Present,  decelerations:  Absent UC:   regular, every 5-6 minutes SVE:   Dilation: 1 Effacement (%): Thick Station: -3 Exam by:: Evonnie Dawes, rn  Labs: Lab Results  Component Value Date   WBC 15.3 (H) 12/04/2015   HGB 7.9 (L) 12/04/2015   HCT 23.8 (L) 12/04/2015   MCV 78.8 12/04/2015   PLT 240 12/04/2015    Assessment / Plan: yet to be in labor  Labor: yet to be in labor  Preeclampsia:  no signs or symptoms of toxicity, intake and ouput balanced and labs stable Fetal Wellbeing:  Category I Pain Control:  Labor support without medications I/D:  n/a Anticipated MOD:  NSVD  [name] 12/04/2015, 6:57 PM

## 2015-12-04 NOTE — H&P (Signed)
Deborah Gibbs is a 25 y.o. female G2P0010 @ 39.6 wks presenting for SROM at 0522 this morniong clear fluid. Gross leaking noted on admit OB History    Gravida Para Term Preterm AB Living   2       1 0   SAB TAB Ectopic Multiple Live Births     1           Past Medical History:  Diagnosis Date  . Abortion   . Allergy   . Fibroid    Past Surgical History:  Procedure Laterality Date  . WISDOM TOOTH EXTRACTION     Family History: family history includes Cancer in her brother and maternal grandmother; Hypertension in her father. Social History:  reports that she has never smoked. She has never used smokeless tobacco. She reports that she does not drink alcohol or use drugs.     Maternal Diabetes: No Genetic Screening: Normal Maternal Ultrasounds/Referrals: Normal Fetal Ultrasounds or other Referrals:  None Maternal Substance Abuse:  No Significant Maternal Medications:  None Significant Maternal Lab Results:  None Other Comments:  None  ROS Maternal Medical History:  Reason for admission: Rupture of membranes.   Fetal activity: Perceived fetal activity is normal.   Last perceived fetal movement was within the past hour.    Prenatal complications: no prenatal complications Prenatal Complications - Diabetes: none.    Dilation: 2.5 Effacement (%): 60 Station: -2 Exam by:: benji stanley RN Blood pressure 119/73, pulse 81, temperature 98.5 F (36.9 C), temperature source Oral, resp. rate 16, last menstrual period 03/10/2015, SpO2 100 %. Maternal Exam:  Uterine Assessment: Contraction strength is mild.  Contraction frequency is irregular.   Abdomen: Patient reports no abdominal tenderness. Fetal presentation: vertex  Introitus: Normal vulva. Normal vagina.  Ferning test: positive.  Nitrazine test: not done. Amniotic fluid character: clear.  Pelvis: adequate for delivery.   Cervix: Cervix evaluated by digital exam.     Fetal Exam Fetal Monitor Review: Mode:  ultrasound.   Variability: moderate (6-25 bpm).   Pattern: accelerations present.    Fetal State Assessment: Category I - tracings are normal.     Physical Exam  Constitutional: She is oriented to person, place, and time. She appears well-developed and well-nourished.  HENT:  Head: Normocephalic.  Eyes: Pupils are equal, round, and reactive to light.  Cardiovascular: Normal rate, regular rhythm, normal heart sounds and intact distal pulses.   Respiratory: Effort normal and breath sounds normal.  GI: Soft. Bowel sounds are normal.  Genitourinary: Vagina normal and uterus normal.  Musculoskeletal: Normal range of motion.  Neurological: She is alert and oriented to person, place, and time. She has normal reflexes.  Skin: Skin is warm and dry.  Psychiatric: She has a normal mood and affect. Her behavior is normal. Judgment and thought content normal.    Prenatal labs: ABO, Rh: O/NEG/-- (03/21 0952) Antibody: NEG (03/21 0952) Rubella: 5.03 (03/21 0952) RPR: NON REAC (06/08 1449)  HBsAg: NEGATIVE (03/21 0952)  HIV: NONREACTIVE (06/08 1449)  GBS:     Assessment/Plan: Preg at 39.6 wks SROM cl fluid, fern pos, gross rupt noted. SVE 2-3/60/-2 Admit   Deborah Gibbs 12/04/2015, 9:29 AM

## 2015-12-04 NOTE — MAU Note (Signed)
Was seen in MAU earlier- 2 cm. Water broke at [employer], brought in by ambulance. Contractions every 6-7 min.  No bleeding. Baby moving well.

## 2015-12-04 NOTE — Progress Notes (Signed)
Deborah CENTRELLA is a 25 y.o. G2P0010 at [redacted]w[redacted]d by ultrasound admitted for rupture of membranes  Subjective:   Objective: BP 111/73   Pulse 82   Temp 99.1 F (37.3 C) (Oral)   Resp 18   Ht 5\' 3"  (1.6 m)   Wt 161 lb (73 kg)   LMP 03/10/2015   SpO2 100%   BMI 28.52 kg/m  No intake/output data recorded. No intake/output data recorded.  FHT:  FHR: 130's bpm, variability: moderate,  accelerations:  Present,  decelerations:  Absent UC:   regular, every 2-5 minutes SVE:   Dilation: 2.5 Effacement (%): 70, 80 Station: -2 Exam by:: Franchot Erichsen, RNC  Labs: Lab Results  Component Value Date   WBC 15.3 (H) 12/04/2015   HGB 7.9 (L) 12/04/2015   HCT 23.8 (L) 12/04/2015   MCV 78.8 12/04/2015   PLT 240 12/04/2015    Assessment / Plan: Augmentation of labor, progressing well  Labor: Progressing normally Preeclampsia:  no signs or symptoms of toxicity, intake and ouput balanced and labs stable Fetal Wellbeing:  Category I Pain Control:  Labor support without medications I/D:  n/a Anticipated MOD:  NSVD  [name] 12/04/2015, 10:07 PM

## 2015-12-04 NOTE — Anesthesia Pain Management Evaluation Note (Signed)
  [facility] Pain Management Visit Note  Patient: Deborah Gibbs, 25 y.o., female  "Hello I am a member of the anesthesia team at [facility]. We have an anesthesia team available at all times to provide care throughout the hospital, including epidural management and anesthesia for C-section. I don't know your plan for the delivery whether it a natural birth, water birth, IV sedation, nitrous supplementation, doula or epidural, but we want to meet your pain goals."   1.Was your pain managed to your expectations on prior hospitalizations?   Yes   2.What is your expectation for pain management during this hospitalization?     IV pain meds  3.How can we help you reach that goal? IV pain rx   Record the patient's initial score and the patient's pain goal.   Pain: 7  Pain Goal: 8 [facility] wants you to be able to say your pain was always managed very well.  Adewale Pucillo 12/04/2015

## 2015-12-05 ENCOUNTER — Encounter (HOSPITAL_COMMUNITY): Payer: Self-pay

## 2015-12-05 DIAGNOSIS — Z3A40 40 weeks gestation of pregnancy: Secondary | ICD-10-CM

## 2015-12-05 LAB — RPR: RPR Ser Ql: NONREACTIVE

## 2015-12-05 MED ORDER — ACETAMINOPHEN 650 MG RE SUPP
650.0000 mg | Freq: Once | RECTAL | Status: AC
  Administered 2015-12-05: 650 mg via RECTAL
  Filled 2015-12-05: qty 1

## 2015-12-05 MED ORDER — ZOLPIDEM TARTRATE 5 MG PO TABS: 5.0000 mg | ORAL_TABLET | Freq: Every evening | ORAL | Status: DC | PRN

## 2015-12-05 MED ORDER — ACETAMINOPHEN 325 MG PO TABS
650.0000 mg | ORAL_TABLET | ORAL | Status: DC | PRN
  Administered 2015-12-05 – 2015-12-06 (×2): 650 mg via ORAL
  Filled 2015-12-05 (×2): qty 2

## 2015-12-05 MED ORDER — OXYCODONE HCL 5 MG PO TABS
5.0000 mg | ORAL_TABLET | Freq: Four times a day (QID) | ORAL | Status: DC | PRN
  Administered 2015-12-05: 5 mg via ORAL
  Filled 2015-12-05: qty 1

## 2015-12-05 MED ORDER — SIMETHICONE 80 MG PO CHEW: 80.0000 mg | CHEWABLE_TABLET | ORAL | Status: DC | PRN

## 2015-12-05 MED ORDER — BENZOCAINE-MENTHOL 20-0.5 % EX AERO
1.0000 "application " | INHALATION_SPRAY | CUTANEOUS | Status: DC | PRN
  Administered 2015-12-07: 1 via TOPICAL
  Filled 2015-12-05: qty 56

## 2015-12-05 MED ORDER — WITCH HAZEL-GLYCERIN EX PADS: 1.0000 "application " | MEDICATED_PAD | CUTANEOUS | Status: DC | PRN

## 2015-12-05 MED ORDER — TETANUS-DIPHTH-ACELL PERTUSSIS 5-2.5-18.5 LF-MCG/0.5 IM SUSP: 0.5000 mL | Freq: Once | INTRAMUSCULAR | Status: DC

## 2015-12-05 MED ORDER — ACETAMINOPHEN 500 MG PO TABS
1000.0000 mg | ORAL_TABLET | Freq: Once | ORAL | Status: AC
Start: 2015-12-05 — End: 2015-12-05
  Administered 2015-12-05: 1000 mg via ORAL
  Filled 2015-12-05: qty 2

## 2015-12-05 MED ORDER — PRENATAL MULTIVITAMIN CH
1.0000 | ORAL_TABLET | Freq: Every day | ORAL | Status: DC
  Administered 2015-12-05 – 2015-12-06 (×2): 1 via ORAL
  Filled 2015-12-05 (×2): qty 1

## 2015-12-05 MED ORDER — VANCOMYCIN HCL IN DEXTROSE 1-5 GM/200ML-% IV SOLN
1000.0000 mg | Freq: Three times a day (TID) | INTRAVENOUS | Status: DC
  Administered 2015-12-05: 1000 mg via INTRAVENOUS
  Filled 2015-12-05 (×2): qty 200

## 2015-12-05 MED ORDER — SENNOSIDES-DOCUSATE SODIUM 8.6-50 MG PO TABS
2.0000 | ORAL_TABLET | ORAL | Status: DC
  Administered 2015-12-05 – 2015-12-06 (×2): 2 via ORAL
  Filled 2015-12-05 (×2): qty 2

## 2015-12-05 MED ORDER — DIBUCAINE 1 % RE OINT: 1.0000 "application " | TOPICAL_OINTMENT | RECTAL | Status: DC | PRN

## 2015-12-05 MED ORDER — LIDOCAINE HCL (PF) 1 % IJ SOLN
INTRAMUSCULAR | Status: DC | PRN
  Administered 2015-12-04: 6 mL via EPIDURAL
  Administered 2015-12-04: 4 mL via EPIDURAL

## 2015-12-05 MED ORDER — IBUPROFEN 600 MG PO TABS
600.0000 mg | ORAL_TABLET | Freq: Four times a day (QID) | ORAL | Status: DC
  Administered 2015-12-05 – 2015-12-07 (×8): 600 mg via ORAL
  Filled 2015-12-05 (×8): qty 1

## 2015-12-05 MED ORDER — RHO D IMMUNE GLOBULIN 1500 UNIT/2ML IJ SOSY
300.0000 ug | PREFILLED_SYRINGE | Freq: Once | INTRAMUSCULAR | Status: AC
  Administered 2015-12-05: 300 ug via INTRAVENOUS
  Filled 2015-12-05: qty 2

## 2015-12-05 MED ORDER — DEXTROSE 5 % IV SOLN
160.0000 mg | Freq: Once | INTRAVENOUS | Status: AC
  Administered 2015-12-05: 160 mg via INTRAVENOUS
  Filled 2015-12-05: qty 4

## 2015-12-05 MED ORDER — COCONUT OIL OIL: 1.0000 "application " | TOPICAL_OIL | Status: DC | PRN

## 2015-12-05 MED ORDER — ONDANSETRON HCL 4 MG/2ML IJ SOLN: 4.0000 mg | INTRAMUSCULAR | Status: DC | PRN

## 2015-12-05 MED ORDER — ONDANSETRON HCL 4 MG PO TABS: 4.0000 mg | ORAL_TABLET | ORAL | Status: DC | PRN

## 2015-12-05 MED ORDER — MISOPROSTOL 200 MCG PO TABS
ORAL_TABLET | ORAL | Status: AC
  Filled 2015-12-05: qty 5

## 2015-12-05 MED ORDER — GENTAMICIN SULFATE 40 MG/ML IJ SOLN
150.0000 mg | Freq: Three times a day (TID) | INTRAVENOUS | Status: DC
  Filled 2015-12-05: qty 3.75

## 2015-12-05 NOTE — Plan of Care (Signed)
Problem: Nutritional: Goal: Mother's verbalization of comfort with breastfeeding process will improve Outcome: Progressing Patient has chosen to breast feed and formula feed her infant. The infant did not latch after birth during skin-to-skin contact in the [facility] and was fed 5 cc formula soon after transferring to room 129. As of this time, the infant has not yet successfully latched and breast fed. The infant was placed skin-to-skin with patient at 1539; however, infant slept through this contact. Patient informed of the significance and benefits of skin-to-skin contact and instructed to attempt to breast feed the infant on feeding cues and if infant has been sleeping since his last feeding to put the infant skin-to-skin to attempt a breast feeding. Patient encouraged to call us for assistance latching the infant.

## 2015-12-05 NOTE — Progress Notes (Signed)
Deborah Gibbs is a 25 y.o. G2P0010 at [redacted]w[redacted]d by ultrasound admitted for rupture of membranes  Subjective:   Objective: BP (!) 98/51   Pulse (!) 118   Temp (!) 102.3 F (39.1 C) (Oral)   Resp 18   Ht 5\' 3"  (1.6 m)   Wt 161 lb (73 kg)   LMP 03/10/2015   SpO2 97%   BMI 28.52 kg/m  No intake/output data recorded. No intake/output data recorded.  FHT:  FHR: 150's bpm, variability: moderate,  accelerations:  Abscent,  decelerations:  Absent UC:   regular, every 2 minutes SVE:   Dilation: Lip/rim Effacement (%): 100 Station: +1, +2 Exam by:: Franchot Erichsen, RNC  Labs: Lab Results  Component Value Date   WBC 15.3 (H) 12/04/2015   HGB 7.9 (L) 12/04/2015   HCT 23.8 (L) 12/04/2015   MCV 78.8 12/04/2015   PLT 240 12/04/2015    Assessment / Plan: Augmentation of labor, progressing well  Labor: Progressing normally Preeclampsia:  no signs or symptoms of toxicity and intake and ouput balanced Fetal Wellbeing:  Category I Pain Control:  Epidural I/D:  n/a Anticipated MOD:  NSVD  [name] 12/05/2015, 7:44 AM

## 2015-12-05 NOTE — Anesthesia Postprocedure Evaluation (Signed)
Anesthesia Post Note  Patient: Deborah Gibbs  Procedure(s) Performed: * No procedures listed *  Patient location during evaluation: Mother Baby Anesthesia Type: Epidural Level of consciousness: awake and alert and oriented Pain management: pain level controlled Vital Signs Assessment: post-procedure vital signs reviewed and stable Respiratory status: spontaneous breathing and nonlabored ventilation Cardiovascular status: stable Postop Assessment: no headache, no backache, patient able to bend at knees, no signs of nausea or vomiting and adequate PO intake Anesthetic complications: no     Last Vitals:  Vitals:   12/05/15 1101 12/05/15 1145  BP: 112/68 111/62  Pulse: 88 84  Resp: 18 20  Temp:  37.3 C    Last Pain:  Vitals:   12/05/15 1145  TempSrc: Oral  PainSc:    Pain Goal: Patients Stated Pain Goal: 0 (12/04/15 0615)               Willa Rough

## 2015-12-05 NOTE — Progress Notes (Signed)
Kellie Simmering, CNM, notified of MVU's on 30 milliunits of pitocin. Per CNM, RN may titrate pitocin up to 36 milliunits.

## 2015-12-05 NOTE — Lactation Note (Signed)
This note was copied from a baby's chart. Lactation Consultation Note  Patient Name: Deborah Gibbs [id number] Date: 12/05/2015 Reason for consult: Initial assessment Breastfeeding consultation services and support information given to patient. Baby is 4 hours old and has been fed formula twice.  Mom states she does not want to put baby to breast but may consider pumping and bottle feeding.  Patient will ask nurse to set up a pump when she is ready. Instructed to pump 8-12 times in 24 hours to establish a good milk supply.  Maternal Data Does the patient have breastfeeding experience prior to this delivery?: No  Feeding Feeding Type: Formula Nipple Type: Slow - flow  LATCH Score/Interventions                      Lactation Tools Discussed/Used     Consult Status Consult Status: PRN    Carla Drape S 12/05/2015, 1:51 PM

## 2015-12-05 NOTE — Progress Notes (Signed)
Deborah Gibbs is a 25 y.o. G2P0010 at [redacted]w[redacted]d by ultrasound admitted for rupture of membranes  Subjective:   Objective: BP (!) 102/47   Pulse 90   Temp 98.9 F (37.2 C) (Oral)   Resp 18   Ht 5\' 3"  (1.6 m)   Wt 161 lb (73 kg)   LMP 03/10/2015   SpO2 98%   BMI 28.52 kg/m  No intake/output data recorded. No intake/output data recorded.  FHT:  FHR: 130 bpm, variability: moderate,  accelerations:  Present,  decelerations:  Absent UC:   regular, every 2 minutes SVE:   Dilation: 3 Effacement (%): 80 Station: -1 Exam by:: Franchot Erichsen, RNC  Labs: Lab Results  Component Value Date   WBC 15.3 (H) 12/04/2015   HGB 7.9 (L) 12/04/2015   HCT 23.8 (L) 12/04/2015   MCV 78.8 12/04/2015   PLT 240 12/04/2015    Assessment / Plan: Augmentation of labor, progressing well  Labor: Progressing normally Preeclampsia:  no signs or symptoms of toxicity and intake and ouput balanced Fetal Wellbeing:  Category I Pain Control:  Epidural I/D:  n/a Anticipated MOD:  NSVD  [name] 12/05/2015, 1:25 AM

## 2015-12-05 NOTE — Anesthesia Procedure Notes (Signed)
Epidural Patient location during procedure: OB Start time: 12/05/2015 11:48 PM End time: 12/05/2015 11:54 PM  Staffing Anesthesiologist: Nilda Simmer Performed: anesthesiologist   Preanesthetic Checklist Completed: patient identified, surgical consent, pre-op evaluation, timeout performed, IV checked, risks and benefits discussed and monitors and equipment checked  Epidural Patient position: sitting Prep: site prepped and draped and DuraPrep Patient monitoring: continuous pulse ox and blood pressure Approach: midline Location: L2-L3 Injection technique: LOR air  Needle:  Needle type: Tuohy  Needle gauge: 17 G Needle length: 9 cm and 9 Needle insertion depth: 9 cm Catheter type: closed end flexible Catheter size: 19 Gauge Catheter at skin depth: 13 cm Test dose: negative  Assessment Events: blood not aspirated, injection not painful, no injection resistance, negative IV test and no paresthesia  Additional Notes Reason for block:procedure for pain

## 2015-12-05 NOTE — Progress Notes (Signed)
.  pharm ANTIBIOTIC CONSULT NOTE - INITIAL  Pharmacy Consult for Gentamicin and Vancomycin Indication: Chorioamnionitis  Allergies  Allergen Reactions  . Diphenhydramine Hcl Hives, Swelling and Other (See Comments)    Reaction:  Facial/hand swelling  . Meloxicam Hives, Swelling and Other (See Comments)    Reaction:  Facial/hand swelling   . Penicillins Hives and Other (See Comments)    Has patient had a PCN reaction causing immediate rash, facial/tongue/throat swelling, SOB or lightheadedness with hypotension: No Has patient had a PCN reaction causing severe rash involving mucus membranes or skin necrosis: No Has patient had a PCN reaction that required hospitalization No Has patient had a PCN reaction occurring within the last 10 years: Yes If all of the above answers are "NO", then may proceed with Cephalosporin use.     Patient Measurements: Height: 5\' 3"  (160 cm) Weight: 161 lb (73 kg) IBW/kg (Calculated) : 52.4 Adjusted Body Weight: 58.6 kg  Vital Signs: Temp: 101.3 F (38.5 C) (08/28 0345) Temp Source: Axillary (08/28 0345) BP: 125/75 (08/28 0401) Pulse Rate: 90 (08/28 0401) Intake/Output from previous day: No intake/output data recorded. Intake/Output from this shift: No intake/output data recorded.  Labs:  Recent Labs  12/04/15 0905  WBC 15.3*  HGB 7.9*  PLT 240   CrCl cannot be calculated (Patient's most recent lab result is older than the maximum 21 days allowed.). No results for input(s): VANCOTROUGH, VANCOPEAK, VANCORANDOM, GENTTROUGH, GENTPEAK, GENTRANDOM, TOBRATROUGH, TOBRAPEAK, TOBRARND, AMIKACINPEAK, AMIKACINTROU, AMIKACIN in the last 72 hours.   Microbiology: Recent Results (from the past 720 hour(s))  OB RESULT CONSOLE Group B Strep     Status: None   Collection Time: 11/07/15 12:00 AM  Result Value Ref Range Status   GBS Negative  Final  Culture, Grp B Strep w/Rflx Suscept     Status: None   Collection Time: 11/07/15  4:15 PM  Result Value  Ref Range Status   Organism ID, Bacteria NO GROUP B STREP (S.AGALACTIAE) ISOLATED  Final    Medical History: Past Medical History:  Diagnosis Date  . Abortion   . Allergy   . Fibroid     Medications:   Assessment: 25 yo G2P0010 at @39 .6 weeks with SROM; now in active labor with increased temperature and presumed chorioamnionitis. Pt is allergic to penicillin and will be started on gentamicin and vancomycin.  Est CrCl: >100 ml/min Gentamicin Ke: 0.518  Vd: 23.4 Liters  Goal of Therapy:  Vancomycin troughs 15-20 mcg/ml Gentamicin peaks 6-8 mcg/ml and troughs <1 mcg/ml  Plan:  Vancomycin 1 Gm IV every 8 hours Gentamicin 160 mg loading dose; then 150 mg IV every 8 hours Scr with next labs if antibiotics are continued Vancomycin and gentamicin levels as indicated by clinical response and duration of therapy.  Norberto Sorenson 12/05/2015,4:21 AM

## 2015-12-05 NOTE — Progress Notes (Signed)
Removed patient's epidural per policy. Tip was intact.

## 2015-12-05 NOTE — Progress Notes (Signed)
Delivery of live viable female by Dr Gwyndolyn Kaufman

## 2015-12-05 NOTE — Progress Notes (Signed)
Deborah Gibbs, CNM, notified that pt became nauseous and vomited after 1000 mg of tylenol administered. 1 tablet seen within throw up. Pharmacy notified and suggested a order of 650 mg tylenol suppository be given. Order received from [facility] for suppository.

## 2015-12-06 LAB — RH IG WORKUP (INCLUDES ABO/RH)
ABO/RH(D): O NEG
Fetal Screen: NEGATIVE
Gestational Age(Wks): 40
Unit division: 0

## 2015-12-06 MED ORDER — FERROUS SULFATE 325 (65 FE) MG PO TABS
325.0000 mg | ORAL_TABLET | Freq: Two times a day (BID) | ORAL | Status: DC
  Administered 2015-12-06 – 2015-12-07 (×2): 325 mg via ORAL
  Filled 2015-12-06 (×2): qty 1

## 2015-12-06 NOTE — Progress Notes (Signed)
Post Partum Day 1 Subjective: no complaints, up ad lib, voiding, tolerating PO and + flatus. Denies abdominal tenderness, N/V, or fevers.  Objective: Blood pressure (!) 93/54, pulse 63, temperature 97.9 F (36.6 C), temperature source Oral, resp. rate 18, height 5\' 3"  (1.6 m), weight 73 kg (161 lb), last menstrual period 03/10/2015, SpO2 100 %.  Physical Exam:  General: alert, cooperative and no distress Lochia: appropriate Uterine Fundus: firm DVT Evaluation: No evidence of DVT seen on physical exam.   Recent Labs  12/04/15 0905  HGB 7.9*  HCT 23.8*    Assessment/Plan: Deborah Gibbs is a 25yo G2P1011 doing well on PPD #1. Labor complicated by triple I, treated with gentamicin and vancomycin (given PCN allergy). Plan for discharge tomorrow. Mom is doing both formula and breastfeeding and intends to use depo injection for contraception. Plan for outpatient circumcision.   LOS: 2 days   Lorenza Evangelist 12/06/2015, 7:44 AM

## 2015-12-06 NOTE — Plan of Care (Signed)
Problem: Nutritional: Goal: Mother's verbalization of comfort with breastfeeding process will improve Outcome: Progressing Infant is now able to latch on the breast and has breast fed for several short sessions today. Patient showing relief and happiness that the infant has made this progress. I have worked with her with positioning and latching the infant in a cross hold position, skin-to-skin. A DEBP was set up late this morning. During the initial pumping, no colostrum was expressed. Emotional support and encouragement offered. I encouraged the patient to pump every three hours to promote milk production.  I also reviewed hand expression with the patient. MGM supportive of breastfeeding.

## 2015-12-06 NOTE — Progress Notes (Signed)
POSTPARTUM PROGRESS NOTE  Post Partum Day 1 Subjective:  Deborah Gibbs is a 25 y.o. [id number] [redacted]w[redacted]d s/p SVD.  No acute events overnight.  Pt denies problems with ambulating, voiding or po intake.  She denies nausea or vomiting.  Pain is well controlled.  She has had flatus. She has not had bowel movement.  Lochia Minimal.   Objective: Blood pressure (!) 93/54, pulse 63, temperature 97.9 F (36.6 C), temperature source Oral, resp. rate 18, height 5\' 3"  (1.6 m), weight 161 lb (73 kg), last menstrual period 03/10/2015, SpO2 100 %, unknown if currently breastfeeding.  Physical Exam:  General: alert, cooperative and no distress Lochia:normal flow Chest: no respiratory distress Heart:regular rate, distal pulses intact Abdomen: soft, nontender,  Uterine Fundus: firm, appropriately tender DVT Evaluation: No calf swelling or tenderness Extremities: trace edema   Recent Labs  12/04/15 0905  HGB 7.9*  HCT 23.8*    Assessment/Plan:  ASSESSMENT: Deborah Gibbs is a 25 y.o. G2P1011 [redacted]w[redacted]d s/p SVD with Tripple I  Pt with anemia, started on FeSo4 today.   Additionally pt with Fever in labor, received vanc and gent. She has not had fever post partum. Continue to monitor.   Plan for discharge tomorrow   LOS: 2 days   Brayton Mars 12/06/2015, 9:35 AM

## 2015-12-07 ENCOUNTER — Encounter: Payer: Self-pay | Admitting: Advanced Practice Midwife

## 2015-12-07 MED ORDER — IBUPROFEN 600 MG PO TABS: 600.0000 mg | ORAL_TABLET | Freq: Four times a day (QID) | ORAL | 1 refills | Status: DC

## 2015-12-07 MED ORDER — SENNOSIDES-DOCUSATE SODIUM 8.6-50 MG PO TABS: 2.0000 | ORAL_TABLET | Freq: Every day | ORAL | 0 refills | Status: DC | PRN

## 2015-12-07 MED ORDER — FERROUS SULFATE 325 (65 FE) MG PO TABS: 325.0000 mg | ORAL_TABLET | Freq: Two times a day (BID) | ORAL | 3 refills | Status: DC

## 2015-12-07 NOTE — Discharge Summary (Signed)
OB Discharge Summary     Patient Name: Deborah Gibbs DOB: 1990/04/20 MRN: [id number]  Date of admission: 12/04/2015 Delivering MD: Waldemar Dickens   Date of discharge: 12/07/2015  Admitting diagnosis: undetermined upon registration, broght in by ambulance Intrauterine pregnancy: [redacted]w[redacted]d     Secondary diagnosis:  Active Problems:   Leakage of amniotic fluid  Additional problems: Triple I, Rh negative      Discharge diagnosis: Term Pregnancy Delivered                                                                                                Post partum procedures:rhogam  Augmentation: None  Complications: Intrauterine Inflammation or infection (Chorioamniotis).   Hospital course:  Onset of Labor With Vaginal Delivery     25 y.o. yo G2P1011 at [redacted]w[redacted]d was admitted in Latent Labor/ grossly ruptured on 12/04/2015. Labor was complicated by Triple I, patient received vancomycin and gentamicin for treatment. Labor course as follows:  Membrane Rupture Time/Date: 5:22 AM ,12/04/2015   Intrapartum Procedures: Episiotomy:                                           Lacerations:  1st degree [2];Periurethral [8]  Patient had a delivery of a Viable infant. 12/05/2015  Information for the patient's newborn:  Ellaria, Hallock [id number]  Delivery Method: Vaginal, Spontaneous Delivery (Filed from Delivery Summary)    Pateint had an uncomplicated postpartum course.  She is ambulating, tolerating a regular diet, passing flatus, and urinating well. Patient is discharged home in stable condition on 12/07/15.    Physical exam Vitals:   12/05/15 2303 12/06/15 0614 12/06/15 1700 12/07/15 0545  BP: (!) 107/53 (!) 93/54 113/70 111/62  Pulse: 77 63 (!) 59 64  Resp: 18 18 18 16   Temp: 98.3 F (36.8 C) 97.9 F (36.6 C) 99.1 F (37.3 C) 98.6 F (37 C)  TempSrc: Oral Oral Oral Oral  SpO2: 99% 100%    Weight:      Height:       General: alert and cooperative Lochia: appropriate Uterine  Fundus: firm Incision: N/A DVT Evaluation: No evidence of DVT seen on physical exam. Labs: Lab Results  Component Value Date   WBC 15.3 (H) 12/04/2015   HGB 7.9 (L) 12/04/2015   HCT 23.8 (L) 12/04/2015   MCV 78.8 12/04/2015   PLT 240 12/04/2015   CMP Latest Ref Rng & Units 08/10/2015  Glucose 65 - 99 mg/dL 86  BUN 6 - 20 mg/dL <5(L)  Creatinine 0.44 - 1.00 mg/dL 0.41(L)  Sodium 135 - 145 mmol/L 135  Potassium 3.5 - 5.1 mmol/L 3.3(L)  Chloride 101 - 111 mmol/L 105  CO2 22 - 32 mmol/L 22  Calcium 8.9 - 10.3 mg/dL 9.2  Total Protein 6.5 - 8.1 g/dL 6.1(L)  Total Bilirubin 0.3 - 1.2 mg/dL 0.3  Alkaline Phos 38 - 126 U/L 30(L)  AST 15 - 41 U/L 16  ALT 14 - 54 U/L 11(L)  Discharge instruction: per After Visit Summary and "Baby and Me Booklet".  After visit meds:    Medication List    STOP taking these medications   acetaminophen 500 MG tablet Commonly known as:  TYLENOL     TAKE these medications   ferrous sulfate 325 (65 FE) MG tablet Take 1 tablet (325 mg total) by mouth 2 (two) times daily with a meal.   ibuprofen 600 MG tablet Commonly known as:  ADVIL,MOTRIN Take 1 tablet (600 mg total) by mouth every 6 (six) hours.   prenatal multivitamin Tabs tablet Take 1 tablet by mouth daily.   senna-docusate 8.6-50 MG tablet Commonly known as:  Senokot-S Take 2 tablets by mouth daily as needed for mild constipation.       Diet: routine diet  Activity: Advance as tolerated. Pelvic rest for 6 weeks.   Outpatient follow up:6 weeks Follow up Appt:No future appointments. Follow up Visit:No Follow-up on file.  Postpartum contraception: Depo Provera  Newborn Data: Live born female  Birth Weight: 6 lb 2 oz (2778 g) APGAR: 9, 9  Baby Feeding: Breast Disposition:home with mother   12/07/2015 Deborah Criss Rosales, MD   OB FELLOW DISCHARGE ATTESTATION  I have seen and examined this patient and agree with above documentation in the resident's note.   Katherine Basset,  DO OB Fellow 10:02 AM

## 2015-12-07 NOTE — Progress Notes (Signed)
Post Partum Day 2 Subjective: Deborah Gibbs is a 25yo G2P1011 [redacted]w[redacted]d s/p SVD. No complaints, up ad lib, voiding, tolerating PO and + flatus. She plans on using both bottle and breast to feed her baby, but she is complaining of trouble breastfeeding and had questions about formula usage.   Objective: Blood pressure 111/62, pulse 64, temperature 98.6 F (37 C), temperature source Oral, resp. rate 16, height 5\' 3"  (1.6 m), weight 73 kg (161 lb), last menstrual period 03/10/2015, SpO2 100 %, unknown if currently breastfeeding.  Physical Exam:  General: alert, cooperative and no distress Lochia: appropriate Uterine Fundus: firm   Recent Labs  12/04/15 0905  HGB 7.9*  HCT 23.8*    Assessment/Plan: Discharge home with lactation consult    LOS: 3 days   Demetrios Isaacs 12/07/2015, 7:22 AM    OB FELLOW MEDICAL STUDENT NOTE ATTESTATION  I have seen and examined this patient. Note this is a Careers information officer note and as such does not necessarily reflect the patient's plan of care. Please see discharge note for this date of service.    Katherine Basset, DO OB Fellow 12/07/2015, 10:02 AM

## 2015-12-07 NOTE — Discharge Instructions (Signed)

## 2015-12-08 LAB — TYPE AND SCREEN
ABO/RH(D): O NEG
Antibody Screen: POSITIVE
DAT, IgG: NEGATIVE
Unit division: 0
Unit division: 0

## 2015-12-10 ENCOUNTER — Encounter: Payer: Self-pay | Admitting: *Deleted

## 2016-01-05 ENCOUNTER — Encounter: Payer: Self-pay | Admitting: Family Medicine

## 2016-01-05 ENCOUNTER — Encounter: Payer: Self-pay | Admitting: Advanced Practice Midwife

## 2016-01-05 ENCOUNTER — Ambulatory Visit (INDEPENDENT_AMBULATORY_CARE_PROVIDER_SITE_OTHER): Payer: 59 | Admitting: Advanced Practice Midwife

## 2016-01-05 VITALS — BP 99/71 | HR 86 | Ht 63.0 in | Wt 150.3 lb

## 2016-01-05 DIAGNOSIS — Z3201 Encounter for pregnancy test, result positive: Secondary | ICD-10-CM | POA: Diagnosis not present

## 2016-01-05 DIAGNOSIS — Z30013 Encounter for initial prescription of injectable contraceptive: Secondary | ICD-10-CM

## 2016-01-05 LAB — POCT PREGNANCY, URINE: Preg Test, Ur: NEGATIVE

## 2016-01-05 MED ORDER — MEDROXYPROGESTERONE ACETATE 150 MG/ML IM SUSP
150.0000 mg | Freq: Once | INTRAMUSCULAR | Status: AC
  Administered 2016-01-05: 150 mg via INTRAMUSCULAR

## 2016-01-05 NOTE — Patient Instructions (Signed)
Postpartum Depression and Baby Blues °The postpartum period begins right after the birth of a baby. During this time, there is often a great amount of joy and excitement. It is also a time of many changes in the life of the parents. Regardless of how many times a mother gives birth, each child brings new challenges and dynamics to the family. It is not unusual to have feelings of excitement along with confusing shifts in moods, emotions, and thoughts. All mothers are at risk of developing postpartum depression or the "baby blues." These mood changes can occur right after giving birth, or they may occur many months after giving birth. The baby blues or postpartum depression can be mild or severe. Additionally, postpartum depression can go away rather quickly, or it can be a long-term condition.  °CAUSES °Raised hormone levels and the rapid drop in those levels are thought to be a main cause of postpartum depression and the baby blues. A number of hormones change during and after pregnancy. Estrogen and progesterone usually decrease right after the delivery of your baby. The levels of thyroid hormone and various cortisol steroids also rapidly drop. Other factors that play a role in these mood changes include major life events and genetics.  °RISK FACTORS °If you have any of the following risks for the baby blues or postpartum depression, know what symptoms to watch out for during the postpartum period. Risk factors that may increase the likelihood of getting the baby blues or postpartum depression include: °· Having a personal or family history of depression.   °· Having depression while being pregnant.   °· Having premenstrual mood issues or mood issues related to oral contraceptives. °· Having a lot of life stress.   °· Having marital conflict.   °· Lacking a social support network.   °· Having a baby with special needs.   °· Having health problems, such as diabetes.   °SIGNS AND SYMPTOMS °Symptoms of baby blues  include: °· Brief changes in mood, such as going from extreme happiness to sadness. °· Decreased concentration.   °· Difficulty sleeping.   °· Crying spells, tearfulness.   °· Irritability.   °· Anxiety.   °Symptoms of postpartum depression typically begin within the first month after giving birth. These symptoms include: °· Difficulty sleeping or excessive sleepiness.   °· Marked weight loss.   °· Agitation.   °· Feelings of worthlessness.   °· Lack of interest in activity or food.   °Postpartum psychosis is a very serious condition and can be dangerous. Fortunately, it is rare. Displaying any of the following symptoms is cause for immediate medical attention. Symptoms of postpartum psychosis include:  °· Hallucinations and delusions.   °· Bizarre or disorganized behavior.   °· Confusion or disorientation.   °DIAGNOSIS  °A diagnosis is made by an evaluation of your symptoms. There are no medical or lab tests that lead to a diagnosis, but there are various questionnaires that a health care provider may use to identify those with the baby blues, postpartum depression, or psychosis. Often, a screening tool called the Edinburgh Postnatal Depression Scale is used to diagnose depression in the postpartum period.  °TREATMENT °The baby blues usually goes away on its own in 1-2 weeks. Social support is often all that is needed. You will be encouraged to get adequate sleep and rest. Occasionally, you may be given medicines to help you sleep.  °Postpartum depression requires treatment because it can last several months or longer if it is not treated. Treatment may include individual or group therapy, medicine, or both to address any social, physiological, and psychological   factors that may play a role in the depression. Regular exercise, a healthy diet, rest, and social support may also be strongly recommended.  °Postpartum psychosis is more serious and needs treatment right away. Hospitalization is often needed. °HOME CARE  INSTRUCTIONS °· Get as much rest as you can. Nap when the baby sleeps.   °· Exercise regularly. Some women find yoga and walking to be beneficial.   °· Eat a balanced and nourishing diet.   °· Do little things that you enjoy. Have a cup of tea, take a bubble bath, read your favorite magazine, or listen to your favorite music. °· Avoid alcohol.   °· Ask for help with household chores, cooking, grocery shopping, or running errands as needed. Do not try to do everything.   °· Talk to people close to you about how you are feeling. Get support from your partner, family members, friends, or other new moms. °· Try to stay positive in how you think. Think about the things you are grateful for.   °· Do not spend a lot of time alone.   °· Only take over-the-counter or prescription medicine as directed by your health care provider. °· Keep all your postpartum appointments.   °· Let your health care provider know if you have any concerns.   °SEEK MEDICAL CARE IF: °You are having a reaction to or problems with your medicine. °SEEK IMMEDIATE MEDICAL CARE IF: °· You have suicidal feelings.   °· You think you may harm the baby or someone else. °MAKE SURE YOU: °· Understand these instructions. °· Will watch your condition. °· Will get help right away if you are not doing well or get worse. °  °This information is not intended to replace advice given to you by your health care provider. Make sure you discuss any questions you have with your health care provider. °  °Document Released: 12/29/2003 Document Revised: 03/31/2013 Document Reviewed: 01/05/2013 °Elsevier Interactive Patient Education ©2016 [employer] ° °

## 2016-01-05 NOTE — Progress Notes (Signed)
Subjective:     Deborah Gibbs is a 25 y.o. female who presents for a postpartum visit. She is 4 weeks postpartum following a spontaneous vaginal delivery. I have fully reviewed the prenatal and intrapartum course. The delivery was at 40 gestational weeks. Outcome: spontaneous vaginal delivery. Anesthesia: epidural. Postpartum course has been uncomplicated. Baby's course has been uncomplicated. Baby is feeding by breast. Bleeding no bleeding. Bowel function is normal. Bladder function is normal. Patient is not sexually active. Contraception method is Depo-Provera injections. Postpartum depression screening: positive.  The following portions of the patient's history were reviewed and updated as appropriate: allergies, current medications, past family history, past medical history, past social history, past surgical history and problem list  Last Pap 05/30/15, normal   Review of Systems Pertinent items are noted in HPI.   Objective:    There were no vitals taken for this visit.  General:  alert, cooperative, appears stated age and no distress   Breasts:  declined  Lungs: clear to auscultation bilaterally  Heart:  regular rate and rhythm, S1, S2 normal, no murmur, click, rub or gallop  Abdomen: soft, non-tender; bowel sounds normal; no masses,  no organomegaly   Vulva:  not evaluated  Vagina: not evaluated        Assessment:     Normal postpartum exam. Pap smear not done at today's visit.   Plan:    1. Contraception: Depo-Provera injections 2. Declined BH visit 3. Follow up in:  12 weeks  for Depo or as needed.

## 2016-03-22 ENCOUNTER — Ambulatory Visit: Payer: Self-pay

## 2016-03-28 ENCOUNTER — Ambulatory Visit (INDEPENDENT_AMBULATORY_CARE_PROVIDER_SITE_OTHER): Payer: 59 | Admitting: *Deleted

## 2016-03-28 VITALS — BP 116/68 | HR 84

## 2016-03-28 DIAGNOSIS — Z3042 Encounter for surveillance of injectable contraceptive: Secondary | ICD-10-CM

## 2016-03-28 MED ORDER — MEDROXYPROGESTERONE ACETATE 150 MG/ML IM SUSP
150.0000 mg | Freq: Once | INTRAMUSCULAR | Status: AC
  Administered 2016-03-28: 150 mg via INTRAMUSCULAR

## 2016-03-28 NOTE — Progress Notes (Signed)
Depo Provera 150 mg given IM as scheduled.  Next dose due 3/7-3/21/18.

## 2016-04-30 ENCOUNTER — Ambulatory Visit (HOSPITAL_COMMUNITY)
Admission: RE | Admit: 2016-04-30 | Discharge: 2016-04-30 | Disposition: A | Discharge status: Home / Self Care | Payer: 59 | Source: Ambulatory Visit | Attending: Chiropractic Medicine | Admitting: Chiropractic Medicine

## 2016-04-30 ENCOUNTER — Other Ambulatory Visit (HOSPITAL_COMMUNITY): Payer: Self-pay | Admitting: Chiropractic Medicine

## 2016-04-30 DIAGNOSIS — M542 Cervicalgia: Secondary | ICD-10-CM | POA: Diagnosis not present

## 2016-04-30 DIAGNOSIS — M545 Low back pain: Secondary | ICD-10-CM

## 2016-04-30 DIAGNOSIS — G8929 Other chronic pain: Secondary | ICD-10-CM | POA: Insufficient documentation

## 2016-05-01 ENCOUNTER — Other Ambulatory Visit (HOSPITAL_COMMUNITY): Payer: Self-pay | Admitting: Chiropractic Medicine

## 2016-05-01 DIAGNOSIS — M5442 Lumbago with sciatica, left side: Secondary | ICD-10-CM

## 2016-05-01 DIAGNOSIS — M5441 Lumbago with sciatica, right side: Secondary | ICD-10-CM

## 2016-05-04 ENCOUNTER — Ambulatory Visit (HOSPITAL_COMMUNITY)
Admission: RE | Admit: 2016-05-04 | Discharge: 2016-05-04 | Disposition: A | Discharge status: Home / Self Care | Payer: 59 | Source: Ambulatory Visit | Attending: Chiropractic Medicine | Admitting: Chiropractic Medicine

## 2016-05-04 DIAGNOSIS — M5127 Other intervertebral disc displacement, lumbosacral region: Secondary | ICD-10-CM | POA: Insufficient documentation

## 2016-05-04 DIAGNOSIS — M5442 Lumbago with sciatica, left side: Secondary | ICD-10-CM

## 2016-05-04 DIAGNOSIS — M545 Low back pain: Secondary | ICD-10-CM | POA: Diagnosis present

## 2016-05-04 DIAGNOSIS — M5126 Other intervertebral disc displacement, lumbar region: Secondary | ICD-10-CM | POA: Diagnosis not present

## 2016-05-04 DIAGNOSIS — M5441 Lumbago with sciatica, right side: Secondary | ICD-10-CM

## 2016-05-23 ENCOUNTER — Ambulatory Visit: Payer: Self-pay | Admitting: Advanced Practice Midwife

## 2016-06-06 DIAGNOSIS — M5126 Other intervertebral disc displacement, lumbar region: Secondary | ICD-10-CM | POA: Insufficient documentation

## 2016-06-06 DIAGNOSIS — M5416 Radiculopathy, lumbar region: Secondary | ICD-10-CM | POA: Insufficient documentation

## 2016-06-06 DIAGNOSIS — M545 Low back pain, unspecified: Secondary | ICD-10-CM | POA: Insufficient documentation

## 2016-06-19 ENCOUNTER — Ambulatory Visit (INDEPENDENT_AMBULATORY_CARE_PROVIDER_SITE_OTHER): Payer: 59 | Admitting: *Deleted

## 2016-06-19 ENCOUNTER — Ambulatory Visit: Payer: Self-pay | Admitting: Student

## 2016-06-19 VITALS — BP 115/73 | HR 87 | Ht 63.0 in | Wt 177.4 lb

## 2016-06-19 DIAGNOSIS — Z3042 Encounter for surveillance of injectable contraceptive: Secondary | ICD-10-CM

## 2016-06-19 MED ORDER — MEDROXYPROGESTERONE ACETATE 150 MG/ML IM SUSP
150.0000 mg | Freq: Once | INTRAMUSCULAR | Status: AC
  Administered 2016-06-19: 150 mg via INTRAMUSCULAR

## 2016-06-19 NOTE — Progress Notes (Signed)
Injection given by Rosario Adie, [place]. I was with her for injection. Pt tolerated well.

## 2016-07-05 ENCOUNTER — Ambulatory Visit: Payer: Self-pay | Admitting: Advanced Practice Midwife

## 2016-08-29 ENCOUNTER — Encounter: Payer: Self-pay | Admitting: Obstetrics and Gynecology

## 2016-08-29 DIAGNOSIS — Z3401 Encounter for supervision of normal first pregnancy, first trimester: Secondary | ICD-10-CM

## 2016-08-29 DIAGNOSIS — Z3480 Encounter for supervision of other normal pregnancy, unspecified trimester: Secondary | ICD-10-CM | POA: Insufficient documentation

## 2016-09-05 ENCOUNTER — Ambulatory Visit: Payer: Self-pay

## 2016-12-24 ENCOUNTER — Encounter: Payer: Self-pay | Admitting: Obstetrics & Gynecology

## 2016-12-24 ENCOUNTER — Ambulatory Visit: Payer: Self-pay | Admitting: Obstetrics & Gynecology

## 2017-02-27 ENCOUNTER — Telehealth: Payer: Self-pay | Admitting: General Practice

## 2017-02-27 NOTE — Telephone Encounter (Signed)
Patient called and left message stating she wants to talk to a nurse to see if what's going on is normal. Patient states she is not pregnant. Called patient, no answer- left message stating we are trying to reach you to return your phone call. We will be closed for the remainder of the week due to the holiday but you may call us back so we can assist you

## 2017-04-09 NOTE — L&D Delivery Note (Signed)
Delivery Note  At 8:20 AM a viable female, named Deborah Gibbs,  was delivered via Vaginal, Spontaneous (Presentation: LOP converted to ROA ).  APGAR: 9, 9; weight  pending  Placenta status: expelled spontaneously with 3 Vx Cord. Loose nuchal cord delivered over the shoulder  Anesthesia:  Local for repair 1% Lidocaine Episiotomy: None Lacerations: Labial bilateral Suture Repair: 4-0 Monocryl Qunatitated Blood Loss (mL): 299  Mom to postpartum.  Baby to Couplet care / Skin to Skin  Planning outpatient circumcision.  [name] A [name] 01/09/2018, 9:01 AM

## 2017-04-20 ENCOUNTER — Encounter (HOSPITAL_COMMUNITY): Payer: Self-pay

## 2017-04-20 ENCOUNTER — Emergency Department (HOSPITAL_COMMUNITY)
Admission: EM | Admit: 2017-04-20 | Discharge: 2017-04-21 | Disposition: A | Discharge status: Home / Self Care | Payer: Managed Care, Other (non HMO) | Attending: Emergency Medicine | Admitting: Emergency Medicine

## 2017-04-20 ENCOUNTER — Other Ambulatory Visit: Payer: Self-pay

## 2017-04-20 DIAGNOSIS — R51 Headache: Secondary | ICD-10-CM | POA: Diagnosis present

## 2017-04-20 DIAGNOSIS — R519 Headache, unspecified: Secondary | ICD-10-CM

## 2017-04-20 MED ORDER — KETOROLAC TROMETHAMINE 30 MG/ML IJ SOLN
30.0000 mg | Freq: Once | INTRAMUSCULAR | Status: AC
  Administered 2017-04-20: 30 mg via INTRAVENOUS
  Filled 2017-04-20: qty 1

## 2017-04-20 MED ORDER — DEXAMETHASONE SODIUM PHOSPHATE 10 MG/ML IJ SOLN
10.0000 mg | Freq: Once | INTRAMUSCULAR | Status: AC
  Administered 2017-04-20: 10 mg via INTRAVENOUS
  Filled 2017-04-20: qty 1

## 2017-04-20 MED ORDER — PROCHLORPERAZINE EDISYLATE 5 MG/ML IJ SOLN
10.0000 mg | Freq: Once | INTRAMUSCULAR | Status: AC
  Administered 2017-04-20: 10 mg via INTRAVENOUS
  Filled 2017-04-20: qty 2

## 2017-04-20 NOTE — ED Provider Notes (Signed)
Patient seen/examined in the Emergency Department in conjunction with Midlevel Provider [place] Patient reports onset of left-sided headache over 6 days ago.  She reports blurred vision left eye, but no visual loss, noted diplopia.  No focal weakness Exam : Awake alert, no distress, EOMI/PERRLA, no facial droop Plan: Patient well-appearing, my suspicion for acute neurologic catastrophe is low After workup in the ED will likely be appropriate for discharge   Ripley Fraise, MD 04/20/17 2356

## 2017-04-20 NOTE — ED Triage Notes (Signed)
Onset 04-14-17 headache, over left forehead and back of head.  Decreased PO fluid/food intake.  Tried to eat applesauce today, vomited with red streaks noted.  Pt has tried Ibuprofen, Tylenol, and Excedrin with no relief.  No h/o migraines.

## 2017-04-20 NOTE — ED Provider Notes (Signed)
[facility] EMERGENCY DEPARTMENT Provider Note   CSN: [id number] Arrival date & time: 04/20/17  2018     History   Chief Complaint Chief Complaint  Patient presents with  . Headache    HPI Deborah Gibbs is a 27 y.o. female.  HPI Deborah Gibbs is a 27 y.o. female presents to emergency department complaining of a headache.  Patient states she developed a headache while driving 6 days ago.  She states pain is on the left side of the head around her eye.  It radiates into the left temple and left ear.  She feels like she has severe infection, however denies any nasal congestion, fever, chills, any other respiratory symptoms.  She states headache is constant, feels like pressure.  She states light is bothering her eyes.  She states she has some pain with extraocular movements.  She reports blurred vision in both eyes, left worse than right.  She has been taking Tylenol, ibuprofen, Excedrin with no relief.  She denies any other numbness or weakness in extremities or neuro deficit.  Denies any head injuries.  No history of similar headaches in the past.  No other complaints.  Past Medical History:  Diagnosis Date  . Abortion   . Allergy   . Fibroid     Patient Active Problem List   Diagnosis Date Noted  . Supervision of other normal pregnancy, antepartum 08/29/2016    Past Surgical History:  Procedure Laterality Date  . WISDOM TOOTH EXTRACTION      OB History    Gravida Para Term Preterm AB Living   3 1 1   1 1    SAB TAB Ectopic Multiple Live Births     1   0 1       Home Medications    Prior to Admission medications   Medication Sig Start Date End Date Taking? Authorizing Provider  acetaminophen (TYLENOL) 325 MG tablet Take 650 mg by mouth every 6 (six) hours as needed for headache (pain).   Yes [provider]  aspirin-acetaminophen-caffeine (EXCEDRIN MIGRAINE) 6472971604 MG tablet Take 1 tablet by mouth every 6 (six) hours as needed for  headache.   Yes [provider]  ibuprofen (ADVIL,MOTRIN) 200 MG tablet Take 800 mg by mouth every 6 (six) hours as needed for headache (pain).   Yes [provider]  naproxen sodium (ALEVE) 220 MG tablet Take 440 mg by mouth 2 (two) times daily as needed (pain/headache).   Yes [provider]  ferrous sulfate 325 (65 FE) MG tablet Take 1 tablet (325 mg total) by mouth 2 (two) times daily with a meal. Patient not taking: Reported on 01/05/2016 12/07/15   Tonette Bihari, MD    Family History Family History  Problem Relation Age of Onset  . Hypertension Father   . Cancer Maternal Grandmother        Breast Cancer  . Cancer Brother        neuroblastoma    Social History Social History   Tobacco Use  . Smoking status: Never Smoker  . Smokeless tobacco: Never Used  Substance Use Topics  . Alcohol use: No  . Drug use: No    Comment: 1-2 X/day     Allergies   Diphenhydramine hcl; Meloxicam; and Penicillins   Review of Systems Review of Systems  Constitutional: Negative for chills and fever.  HENT: Positive for sinus pain. Negative for congestion.   Eyes: Positive for photophobia, pain and visual disturbance.  Respiratory: Negative for cough, chest tightness and shortness of breath.   Cardiovascular: Negative for chest pain, palpitations and leg swelling.  Gastrointestinal: Negative for abdominal pain, diarrhea, nausea and vomiting.  Genitourinary: Negative for dysuria, flank pain, pelvic pain, vaginal bleeding, vaginal discharge and vaginal pain.  Musculoskeletal: Negative for arthralgias, myalgias, neck pain and neck stiffness.  Skin: Negative for rash.  Neurological: Positive for dizziness and headaches. Negative for weakness.  All other systems reviewed and are negative.    Physical Exam Updated Vital Signs BP 123/82 (BP Location: Right Arm)   Pulse 81   Temp 98.6 F (37 C) (Oral)   Resp 18   Ht 5\' 2"  (1.575 m)   Wt 80.3 kg (177 lb)    LMP 05/19/2016   SpO2 99%   Breastfeeding? Unknown   BMI 32.37 kg/m   Physical Exam  Constitutional: She is oriented to person, place, and time. She appears well-developed and well-nourished. No distress.  HENT:  Head: Normocephalic and atraumatic.  Mouth/Throat: Oropharynx is clear and moist.  Left maxillary and frontal sinus tenderness  Eyes: Conjunctivae, EOM and lids are normal. Pupils are equal, round, and reactive to light. Lids are everted and swept, no foreign bodies found. Right eye exhibits normal extraocular motion. Left eye exhibits normal extraocular motion.  Fundoscopic exam:      The right eye shows no hemorrhage and no papilledema.       The left eye shows no hemorrhage and no papilledema.  Neck: Neck supple.  Cardiovascular: Normal rate, regular rhythm and normal heart sounds.  Pulmonary/Chest: Effort normal and breath sounds normal. No respiratory distress. She has no wheezes. She has no rales.  Abdominal: Soft. Bowel sounds are normal. She exhibits no distension. There is no tenderness. There is no rebound.  Musculoskeletal: She exhibits no edema.  Neurological: She is alert and oriented to person, place, and time.  5/5 and equal upper and lower extremity strength bilaterally. Equal grip strength bilaterally. Normal finger to nose and heel to shin. No pronator drift.   Skin: Skin is warm and dry.  Psychiatric: She has a normal mood and affect. Her behavior is normal.  Nursing note and vitals reviewed.    ED Treatments / Results  Labs (all labs ordered are listed, but only abnormal results are displayed) Labs Reviewed  POC URINE PREG, ED    EKG  EKG Interpretation None       Radiology Ct Head Wo Contrast  Result Date: 04/21/2017 CLINICAL DATA:  Left-sided headache and blurry vision to the left eye EXAM: CT HEAD WITHOUT CONTRAST CT MAXILLOFACIAL WITHOUT CONTRAST TECHNIQUE: Multidetector CT imaging of the head and maxillofacial structures were performed  using the standard protocol without intravenous contrast. Multiplanar CT image reconstructions of the maxillofacial structures were also generated. COMPARISON:  None. FINDINGS: CT HEAD FINDINGS Brain: No evidence of acute infarction, hemorrhage, hydrocephalus, extra-axial collection or mass lesion/mass effect. Vascular: No hyperdense vessel or unexpected calcification. Skull: Normal. Negative for fracture or focal lesion. Other: None CT MAXILLOFACIAL FINDINGS Osseous: Mandibular heads are normally position. No mandibular fracture. Pterygoid plates, zygomatic arches and nasal bones are intact Orbits: Negative. No traumatic or inflammatory finding. Sinuses: Small mucous retention cyst in the left maxillary sinus. Mild mucosal thickening in the ethmoid sinuses. No acute fluid levels or sinus wall fracture Soft tissues: Negative. IMPRESSION: 1. No CT evidence for acute intracranial abnormality. 2. Minimal sinus disease. No acute osseous abnormality. Bilateral orbits are within normal limits. Electronically Signed   By:  Donavan Foil M.D.   On: 04/21/2017 00:33   Ct Maxillofacial Wo Contrast  Result Date: 04/21/2017 CLINICAL DATA:  Left-sided headache and blurry vision to the left eye EXAM: CT HEAD WITHOUT CONTRAST CT MAXILLOFACIAL WITHOUT CONTRAST TECHNIQUE: Multidetector CT imaging of the head and maxillofacial structures were performed using the standard protocol without intravenous contrast. Multiplanar CT image reconstructions of the maxillofacial structures were also generated. COMPARISON:  None. FINDINGS: CT HEAD FINDINGS Brain: No evidence of acute infarction, hemorrhage, hydrocephalus, extra-axial collection or mass lesion/mass effect. Vascular: No hyperdense vessel or unexpected calcification. Skull: Normal. Negative for fracture or focal lesion. Other: None CT MAXILLOFACIAL FINDINGS Osseous: Mandibular heads are normally position. No mandibular fracture. Pterygoid plates, zygomatic arches and nasal bones  are intact Orbits: Negative. No traumatic or inflammatory finding. Sinuses: Small mucous retention cyst in the left maxillary sinus. Mild mucosal thickening in the ethmoid sinuses. No acute fluid levels or sinus wall fracture Soft tissues: Negative. IMPRESSION: 1. No CT evidence for acute intracranial abnormality. 2. Minimal sinus disease. No acute osseous abnormality. Bilateral orbits are within normal limits. Electronically Signed   By: Donavan Foil M.D.   On: 04/21/2017 00:33    Procedures Procedures (including critical care time)  Medications Ordered in ED Medications  ketorolac (TORADOL) 30 MG/ML injection 30 mg (not administered)  prochlorperazine (COMPAZINE) injection 10 mg (not administered)  dexamethasone (DECADRON) injection 10 mg (not administered)     Initial Impression / Assessment and Plan / ED Course  I have reviewed the triage vital signs and the nursing notes.  Pertinent labs & imaging results that were available during my care of the patient were reviewed by me and considered in my medical decision making (see chart for details).     Pt in ED with constant headache for 6 days with now visual changes. Pt appears to be very uncomfortable. Photophobia, nausea, vomiting present. Given severity of symptoms, will get CT head and of sinuses. Will try migraine cocktail. No risk factors for Venous sinus thrombosis. Fundoscopic exam unremarkable to my ability. Normal neuro exam.   12:44 AM Pt feels much better after compazine, toradol, decadron. CT head and sinus negative other than mild sinus disease. Pt will be dc home with outpatient follow up. Return precautions discussed.   Vitals:   04/20/17 2027 04/20/17 2217 04/20/17 2300  BP: 123/82  113/81  Pulse: 81  68  Resp: 18  18  Temp: 98.6 F (37 C)    TempSrc: Oral    SpO2: 99%  98%  Weight:  80.3 kg (177 lb)   Height:  5\' 2"  (1.575 m)      Final Clinical Impressions(s) / ED Diagnoses   Final diagnoses:  Bad  headache    ED Discharge Orders    None       Jeannett Senior, PA-C 04/21/17 0047    Ripley Fraise, MD 04/21/17 [phone]

## 2017-04-21 ENCOUNTER — Emergency Department (HOSPITAL_COMMUNITY): Payer: Managed Care, Other (non HMO)

## 2017-04-21 DIAGNOSIS — R51 Headache: Secondary | ICD-10-CM | POA: Diagnosis not present

## 2017-04-21 NOTE — Discharge Instructions (Signed)
Continue Excedrin Migraine for the headaches as needed.  Drink plenty of fluids.  Rest.  Follow-up with family doctor or headache center.  Return if worsening symptoms.

## 2017-04-26 ENCOUNTER — Other Ambulatory Visit: Payer: Self-pay

## 2017-04-26 ENCOUNTER — Emergency Department (HOSPITAL_COMMUNITY)
Admission: EM | Admit: 2017-04-26 | Discharge: 2017-04-26 | Discharge status: Against Medical Advice | Payer: 59 | Attending: Emergency Medicine | Admitting: Emergency Medicine

## 2017-04-26 ENCOUNTER — Encounter (HOSPITAL_COMMUNITY): Payer: Self-pay | Admitting: *Deleted

## 2017-04-26 DIAGNOSIS — Z5321 Procedure and treatment not carried out due to patient leaving prior to being seen by health care provider: Secondary | ICD-10-CM | POA: Diagnosis not present

## 2017-04-26 DIAGNOSIS — R51 Headache: Secondary | ICD-10-CM | POA: Insufficient documentation

## 2017-04-26 NOTE — ED Notes (Signed)
No answer in [place] x 3 for reassessment

## 2017-04-26 NOTE — ED Notes (Signed)
Called pt for vitals x3, no answer. 

## 2017-04-26 NOTE — ED Triage Notes (Signed)
The pt has had a headache  Since jan 6th  She saw a doctor yesterday and the med she was given did not help her  lmp jan 5th    No nv or diarrhea  She was seen here on the 12th of jan for the same

## 2017-05-02 ENCOUNTER — Other Ambulatory Visit: Payer: Self-pay | Admitting: Specialist

## 2017-05-02 DIAGNOSIS — R51 Headache: Principal | ICD-10-CM

## 2017-05-02 DIAGNOSIS — R519 Headache, unspecified: Secondary | ICD-10-CM

## 2017-05-05 ENCOUNTER — Other Ambulatory Visit: Payer: Self-pay

## 2017-05-23 ENCOUNTER — Other Ambulatory Visit: Payer: Self-pay

## 2017-06-24 ENCOUNTER — Other Ambulatory Visit: Payer: Self-pay | Admitting: Obstetrics and Gynecology

## 2017-06-24 ENCOUNTER — Ambulatory Visit (HOSPITAL_COMMUNITY)
Admission: RE | Admit: 2017-06-24 | Discharge: 2017-06-24 | Disposition: A | Discharge status: Home / Self Care | Payer: Managed Care, Other (non HMO) | Source: Ambulatory Visit | Attending: Obstetrics and Gynecology | Admitting: Obstetrics and Gynecology

## 2017-06-24 ENCOUNTER — Other Ambulatory Visit (HOSPITAL_COMMUNITY): Payer: Self-pay | Admitting: Obstetrics and Gynecology

## 2017-06-24 DIAGNOSIS — O26891 Other specified pregnancy related conditions, first trimester: Secondary | ICD-10-CM | POA: Diagnosis not present

## 2017-06-24 DIAGNOSIS — R102 Pelvic and perineal pain: Secondary | ICD-10-CM

## 2017-06-24 DIAGNOSIS — Z3A10 10 weeks gestation of pregnancy: Secondary | ICD-10-CM | POA: Diagnosis not present

## 2017-08-11 ENCOUNTER — Inpatient Hospital Stay (HOSPITAL_COMMUNITY): Payer: 59

## 2017-08-11 ENCOUNTER — Inpatient Hospital Stay (HOSPITAL_COMMUNITY)
Admission: AD | Admit: 2017-08-11 | Discharge: 2017-08-11 | Disposition: A | Discharge status: Home / Self Care | Payer: 59 | Source: Ambulatory Visit | Attending: Obstetrics and Gynecology | Admitting: Obstetrics and Gynecology

## 2017-08-11 ENCOUNTER — Other Ambulatory Visit: Payer: Self-pay

## 2017-08-11 ENCOUNTER — Encounter (HOSPITAL_COMMUNITY): Payer: Self-pay

## 2017-08-11 DIAGNOSIS — Z3A17 17 weeks gestation of pregnancy: Secondary | ICD-10-CM | POA: Insufficient documentation

## 2017-08-11 DIAGNOSIS — O4692 Antepartum hemorrhage, unspecified, second trimester: Secondary | ICD-10-CM | POA: Diagnosis not present

## 2017-08-11 DIAGNOSIS — Z0371 Encounter for suspected problem with amniotic cavity and membrane ruled out: Secondary | ICD-10-CM

## 2017-08-11 DIAGNOSIS — Z880 Allergy status to penicillin: Secondary | ICD-10-CM | POA: Insufficient documentation

## 2017-08-11 DIAGNOSIS — Z6791 Unspecified blood type, Rh negative: Secondary | ICD-10-CM | POA: Insufficient documentation

## 2017-08-11 DIAGNOSIS — O209 Hemorrhage in early pregnancy, unspecified: Secondary | ICD-10-CM | POA: Insufficient documentation

## 2017-08-11 DIAGNOSIS — O26892 Other specified pregnancy related conditions, second trimester: Secondary | ICD-10-CM | POA: Insufficient documentation

## 2017-08-11 LAB — WET PREP, GENITAL
Clue Cells Wet Prep HPF POC: NONE SEEN
Sperm: NONE SEEN
Trich, Wet Prep: NONE SEEN
Yeast Wet Prep HPF POC: NONE SEEN

## 2017-08-11 LAB — POCT FERN TEST: POCT Fern Test: NEGATIVE

## 2017-08-11 MED ORDER — RHO D IMMUNE GLOBULIN 1500 UNIT/2ML IJ SOSY
300.0000 ug | PREFILLED_SYRINGE | Freq: Once | INTRAMUSCULAR | Status: AC
  Administered 2017-08-11: 300 ug via INTRAMUSCULAR
  Filled 2017-08-11: qty 2

## 2017-08-11 NOTE — MAU Provider Note (Signed)
Chief Complaint: Vaginal Bleeding; Rupture of Membranes; and Abdominal Pain   First Provider Initiated Contact with Patient 08/11/17 1630     SUBJECTIVE HPI: Deborah Gibbs is a 27 y.o. G3P1011 at [redacted]w[redacted]d who presents to Maternity Admissions reporting two episodes of leaking clear fluid and seeing blood w/ wiping and cramping. Had [place] 06/24/17. CRL C/W 10.6. Small [place].   Blood Type O neg  Location: low abd Quality: cramping Severity: 5/10 on pain scale Duration: One day Context: [redacted] weeks gestation Timing: Intermittent Modifying factors: Nothing.  Has not tried anything for the pain Associated signs and symptoms: Positive for vaginal bleeding and leaking of fluid.  Negative for urinary complaints, GI complaints.  Gets prenatal care at [facility].  Uncomplicated pregnancy.   Past Medical History:  Diagnosis Date  . Abortion   . Allergy   . Fibroid    OB History  Gravida Para Term Preterm AB Living  3 1 1   1 1   SAB TAB Ectopic Multiple Live Births    1   0 1    # Outcome Date GA Lbr Len/2nd Weight Sex Delivery Anes PTL Lv  3 Current           2 Term 12/05/15 [redacted]w[redacted]d 02:58 / 00:51 6 lb 2 oz (2.778 kg) M Vag-Spont EPI  LIV  1 TAB 2011           Past Surgical History:  Procedure Laterality Date  . WISDOM TOOTH EXTRACTION     Social History   Socioeconomic History  . Marital status: Single    Spouse name: Not on file  . Number of children: Not on file  . Years of education: Not on file  . Highest education level: Not on file  Occupational History  . Not on file  Social Needs  . Financial resource strain: Not on file  . Food insecurity:    Worry: Not on file    Inability: Not on file  . Transportation needs:    Medical: Not on file    Non-medical: Not on file  Tobacco Use  . Smoking status: Never Smoker  . Smokeless tobacco: Never Used  Substance and Sexual Activity  . Alcohol use: No  . Drug use: No    Types: Marijuana    Comment: 1-2 X/day  . Sexual  activity: Never    Birth control/protection: None  Lifestyle  . Physical activity:    Days per week: Not on file    Minutes per session: Not on file  . Stress: Not on file  Relationships  . Social connections:    Talks on phone: Not on file    Gets together: Not on file    Attends religious service: Not on file    Active member of club or organization: Not on file    Attends meetings of clubs or organizations: Not on file    Relationship status: Not on file  . Intimate partner violence:    Fear of current or ex partner: Not on file    Emotionally abused: Not on file    Physically abused: Not on file    Forced sexual activity: Not on file  Other Topics Concern  . Not on file  Social History Narrative  . Not on file   Family History  Problem Relation Age of Onset  . Hypertension Father   . Cancer Maternal Grandmother        Breast Cancer  . Cancer Brother  neuroblastoma   No current facility-administered medications on file prior to encounter.    Current Outpatient Medications on File Prior to Encounter  Medication Sig Dispense Refill  . acetaminophen (TYLENOL) 325 MG tablet Take 650 mg by mouth every 6 (six) hours as needed for headache (pain).    Marland Kitchen aspirin-acetaminophen-caffeine (EXCEDRIN MIGRAINE) 250-250-65 MG tablet Take 1 tablet by mouth every 6 (six) hours as needed for headache.    . ferrous sulfate 325 (65 FE) MG tablet Take 1 tablet (325 mg total) by mouth 2 (two) times daily with a meal. (Patient not taking: Reported on 01/05/2016) 30 tablet 3  . ibuprofen (ADVIL,MOTRIN) 200 MG tablet Take 800 mg by mouth every 6 (six) hours as needed for headache (pain).    . naproxen sodium (ALEVE) 220 MG tablet Take 440 mg by mouth 2 (two) times daily as needed (pain/headache).     Allergies  Allergen Reactions  . Diphenhydramine Hcl Hives, Swelling and Other (See Comments)    Reaction:  Facial/hand swelling  . Meloxicam Hives, Swelling and Other (See Comments)     Reaction:  Facial/hand swelling  Pt has tolerated ibuprofen in the past without reaction  . Penicillins Hives and Other (See Comments)    Has patient had a PCN reaction causing immediate rash, facial/tongue/throat swelling, SOB or lightheadedness with hypotension: No Has patient had a PCN reaction causing severe rash involving mucus membranes or skin necrosis: No Has patient had a PCN reaction that required hospitalization No Has patient had a PCN reaction occurring within the last 10 years: Yes If all of the above answers are "NO", then may proceed with Cephalosporin use.     I have reviewed patient's Past Medical Hx, Surgical Hx, Family Hx, Social Hx, medications and allergies.   Review of Systems  Constitutional: Negative for chills and fever.  Gastrointestinal: Positive for abdominal pain. Negative for constipation, diarrhea, nausea and vomiting.  Genitourinary: Positive for pelvic pain, vaginal bleeding and vaginal discharge. Negative for dysuria, frequency, hematuria and urgency.    OBJECTIVE Patient Vitals for the past 24 hrs:  BP Temp Temp src Pulse Resp  08/11/17 1801 109/71 98.8 F (37.1 C) Oral 72 16  08/11/17 1517 109/65 - - 81 -   Constitutional: Well-developed, well-nourished female in no acute distress.  Cardiovascular: normal rate Respiratory: normal rate and effort.  GI: Abd soft, non-tender, gravid appropriate for gestational age.  MS: Extremities nontender, no edema, normal ROM Neurologic: Alert and oriented x 4.  GU: Neg CVAT.  SPECULUM EXAM: NEFG, physiologic discharge, neg  pooling.  Friable area on cervix at 9:00.  No active bleeding. Cervix visually closed.  Digital exam deferred due to unknown presentation.  FHR 140 by doppler.  LAB RESULTS Results for orders placed or performed during the hospital encounter of 08/11/17 (from the past 24 hour(s))  Rh IG workup (includes ABO/Rh)     Status: None (Preliminary result)   Collection Time: 08/11/17  4:36 PM   Result Value Ref Range   Gestational Age(Wks) 17    ABO/RH(D) O NEG    Antibody Screen NEG    Unit Number [id number]    Blood Component Type RHIG    Unit division 00    Status of Unit ISSUED    Transfusion Status      OK TO TRANSFUSE Performed at [facility], [address] [place], [place] [address]   Wet prep, genital     Status: Abnormal   Collection Time: 08/11/17  5:22 PM  Result Value Ref Range   Yeast Wet Prep HPF POC NONE SEEN NONE SEEN   Trich, Wet Prep NONE SEEN NONE SEEN   Clue Cells Wet Prep HPF POC NONE SEEN NONE SEEN   WBC, Wet Prep HPF POC MODERATE (A) NONE SEEN   Sperm NONE SEEN   POCT fern test     Status: Abnormal   Collection Time: 08/11/17  6:32 PM  Result Value Ref Range   POCT Fern Test Negative = intact amniotic membranes     IMAGING OB Limited Prelim: No previa. Nml CL. Subjectively Nml fluid.   MAU COURSE Orders Placed This Encounter  Procedures  . Wet prep, genital  . [place] MFM OB Limited  . HIV antibody  . POCT fern test  . Rh IG workup (includes ABO/Rh)  . Discharge patient   Meds ordered this encounter  Medications  . rho (d) immune globulin (RHIG/RHOPHYLAC) injection 300 mcg   Discussed Hx, labs, exam w/ Dr. Charlesetta Garibaldi. Agrees w/ POC. New orders: none.   MDM -No evidence of rupture of membranes. -Second trimester bleeding.  No active bleeding.  Rhophylac given due to Rh- blood type. -Cramping.  Normal cervical length on ultrasound  ASSESSMENT 1. Vaginal bleeding in pregnancy, second trimester   2. No leakage of amniotic fluid into vagina   3. Rh negative state in antepartum period, second trimester     PLAN Discharge home in stable condition. Bleeding precautions UA, GC/Chlamydia pending Follow-up Information    [facility] Follow up on 08/16/2017.   Specialty:  Obstetrics and Gynecology Why:  As scheduled or sooner as needed if symptoms worsen Contact information: [address] [place] [address] [phone]       [facility] MATERNITY ADMISSIONS Follow up.   Why:  Needed if symptoms worsen Contact information: [address] [id number] [place] [address] [phone]         Allergies as of 08/11/2017      Reactions   Diphenhydramine Hcl Hives, Swelling, Other (See Comments)   Reaction:  Facial/hand swelling   Meloxicam Hives, Swelling, Other (See Comments)   Reaction:  Facial/hand swelling  Pt has tolerated ibuprofen in the past without reaction   Penicillins Hives, Other (See Comments)   Has patient had a PCN reaction causing immediate rash, facial/tongue/throat swelling, SOB or lightheadedness with hypotension: No Has patient had a PCN reaction causing severe rash involving mucus membranes or skin necrosis: No Has patient had a PCN reaction that required hospitalization No Has patient had a PCN reaction occurring within the last 10 years: Yes If all of the above answers are "NO", then may proceed with Cephalosporin use.      Medication List    STOP taking these medications   aspirin-acetaminophen-caffeine 250-250-65 MG tablet Commonly known as:  EXCEDRIN MIGRAINE   naproxen sodium 220 MG tablet Commonly known as:  ALEVE     TAKE these medications   acetaminophen 325 MG tablet Commonly known as:  TYLENOL Take 650 mg by mouth every 6 (six) hours as needed for headache (pain).   ferrous sulfate 325 (65 FE) MG tablet Take 1 tablet (325 mg total) by mouth 2 (two) times daily with a meal.   ibuprofen 200 MG tablet Commonly known as:  ADVIL,MOTRIN Take 800 mg by mouth every 6 (six) hours as needed for headache (pain).        Tamala Julian, [place], [place] 08/11/2017  7:41 PM

## 2017-08-11 NOTE — Discharge Instructions (Signed)
Vaginal Bleeding During Pregnancy, Second Trimester A small amount of bleeding (spotting) from the vagina is relatively common in pregnancy. It usually stops on its own. Various things can cause bleeding or spotting in pregnancy. Some bleeding may be related to the pregnancy, and some may not. Sometimes the bleeding is normal and is not a problem. However, bleeding can also be a sign of something serious. Be sure to tell your health care provider about any vaginal bleeding right away. Some possible causes of vaginal bleeding during the second trimester include:  Infection, inflammation, or growths on the cervix.  The placenta may be partially or completely covering the opening of the cervix inside the uterus (placenta previa).  The placenta may have separated from the uterus (abruption of the placenta).  You may be having early (preterm) labor.  The cervix may not be strong enough to keep a baby inside the uterus (cervical insufficiency).  Tiny cysts may have developed in the uterus instead of pregnancy tissue (molar pregnancy).  Follow these instructions at home: Watch your condition for any changes. The following actions may help to lessen any discomfort you are feeling:  Follow your health care provider's instructions for limiting your activity. If your health care provider orders bed rest, you may need to stay in bed and only get up to use the bathroom. However, your health care provider may allow you to continue light activity.  If needed, make plans for someone to help with your regular activities and responsibilities while you are on bed rest.  Keep track of the number of pads you use each day, how often you change pads, and how soaked (saturated) they are. Write this down.  Do not use tampons. Do not douche.  Do not have sexual intercourse or orgasms until approved by your health care provider.  If you pass any tissue from your vagina, save the tissue so you can show it to your  health care provider.  Only take over-the-counter or prescription medicines as directed by your health care provider.  Do not take aspirin because it can make you bleed.  Do not exercise or perform any strenuous activities or heavy lifting without your health care provider's permission.  Keep all follow-up appointments as directed by your health care provider.  Contact a health care provider if:  You have any vaginal bleeding during any part of your pregnancy.  You have cramps or labor pains.  You have a fever, not controlled by medicine. Get help right away if:  You have severe cramps in your back or belly (abdomen).  You have contractions.  You have chills.  You pass large clots or tissue from your vagina.  Your bleeding increases.  You feel light-headed or weak, or you have fainting episodes.  You are leaking fluid or have a gush of fluid from your vagina. This information is not intended to replace advice given to you by your health care provider. Make sure you discuss any questions you have with your health care provider. Document Released: 01/03/2005 Document Revised: 09/01/2015 Document Reviewed: 12/01/2012 Elsevier Interactive Patient Education  2018 [place] [D] Immune Globulin injection What is this medicine? RhO [D] IMMUNE GLOBULIN (i MYOON GLOB yoo lin) is used to treat idiopathic thrombocytopenic purpura (ITP). This medicine is used in RhO negative mothers who are pregnant with a RhO positive child. It is also used after a transfusion of RhO positive blood into a RhO negative person. This medicine may be used for other  purposes; ask your health care provider or pharmacist if you have questions. COMMON BRAND NAME(S): BayRho-D, HyperRHO S/D, MICRhoGAM, RhoGAM, Rhophylac, WinRho SDF What should I tell my health care provider before I take this medicine? They need to know if you have any of these conditions: -bleeding disorders -low levels of  immunoglobulin A in the body -no spleen -an unusual or allergic reaction to human immune globulin, other medicines, foods, dyes, or preservatives -pregnant or trying to get pregnant -breast-feeding How should I use this medicine? This medicine is for injection into a muscle or into a vein. It is given by a health care professional in a hospital or clinic setting. Talk to your pediatrician regarding the use of this medicine in children. This medicine is not approved for use in children. Overdosage: If you think you have taken too much of this medicine contact a poison control center or emergency room at once. NOTE: This medicine is only for you. Do not share this medicine with others. What if I miss a dose? It is important not to miss your dose. Call your doctor or health care professional if you are unable to keep an appointment. What may interact with this medicine? -live virus vaccines, like measles, mumps, or rubella This list may not describe all possible interactions. Give your health care provider a list of all the medicines, herbs, non-prescription drugs, or dietary supplements you use. Also tell them if you smoke, drink alcohol, or use illegal drugs. Some items may interact with your medicine. What should I watch for while using this medicine? This medicine is made from human blood. It may be possible to pass an infection in this medicine. Talk to your doctor about the risks and benefits of this medicine. This medicine may interfere with live virus vaccines. Before you get live virus vaccines tell your health care professional if you have received this medicine within the past 3 months. What side effects may I notice from receiving this medicine? Side effects that you should report to your doctor or health care professional as soon as possible: -allergic reactions like skin rash, itching or hives, swelling of the face, lips, or tongue -breathing problems -chest pain or  tightness -yellowing of the eyes or skin Side effects that usually do not require medical attention (report to your doctor or health care professional if they continue or are bothersome): -fever -pain and tenderness at site where injected This list may not describe all possible side effects. Call your doctor for medical advice about side effects. You may report side effects to FDA at 1-800-FDA-1088. Where should I keep my medicine? This drug is given in a hospital or clinic and will not be stored at home. NOTE: This sheet is a summary. It may not cover all possible information. If you have questions about this medicine, talk to your doctor, pharmacist, or health care provider.  2018 Elsevier/Gold Standard (2007-11-24 14:06:10)

## 2017-08-11 NOTE — Progress Notes (Addendum)
G3P1 @ [redacted] wksga. Here for abd lower pain with pinkish bloody discharge. Denies intercourse. Leaking clear fluid twice today.   1632: provider at bs assessing pt   1638: Lab at bs  1721: provider at bs for speculum exam, fern test, wetprep and GC   8341: rhogam administered.   1833: U/s paged  1835: Pt to U/S   1930: Provider in room reassessing and discussing POC  1945: D/c instructions given with pt understanding. Pt left unit via ambulatory.

## 2017-08-12 LAB — GC/CHLAMYDIA PROBE AMP (~~LOC~~) NOT AT ARMC
Chlamydia: NEGATIVE
Neisseria Gonorrhea: NEGATIVE

## 2017-08-12 LAB — RH IG WORKUP (INCLUDES ABO/RH)
ABO/RH(D): O NEG
Antibody Screen: NEGATIVE
Gestational Age(Wks): 17
Unit division: 0

## 2017-08-12 LAB — HIV ANTIBODY (ROUTINE TESTING W REFLEX): HIV Screen 4th Generation wRfx: NONREACTIVE

## 2017-11-11 ENCOUNTER — Encounter (HOSPITAL_COMMUNITY): Payer: Self-pay | Admitting: *Deleted

## 2017-11-11 ENCOUNTER — Inpatient Hospital Stay (HOSPITAL_COMMUNITY)
Admission: AD | Admit: 2017-11-11 | Discharge: 2017-11-11 | Disposition: A | Discharge status: Home / Self Care | Payer: 59 | Source: Ambulatory Visit | Attending: Obstetrics and Gynecology | Admitting: Obstetrics and Gynecology

## 2017-11-11 DIAGNOSIS — O99613 Diseases of the digestive system complicating pregnancy, third trimester: Secondary | ICD-10-CM | POA: Diagnosis not present

## 2017-11-11 DIAGNOSIS — O212 Late vomiting of pregnancy: Secondary | ICD-10-CM | POA: Insufficient documentation

## 2017-11-11 DIAGNOSIS — R102 Pelvic and perineal pain: Secondary | ICD-10-CM

## 2017-11-11 DIAGNOSIS — O26893 Other specified pregnancy related conditions, third trimester: Secondary | ICD-10-CM | POA: Insufficient documentation

## 2017-11-11 DIAGNOSIS — K5900 Constipation, unspecified: Secondary | ICD-10-CM | POA: Insufficient documentation

## 2017-11-11 DIAGNOSIS — Z888 Allergy status to other drugs, medicaments and biological substances status: Secondary | ICD-10-CM | POA: Insufficient documentation

## 2017-11-11 DIAGNOSIS — Z880 Allergy status to penicillin: Secondary | ICD-10-CM | POA: Diagnosis not present

## 2017-11-11 DIAGNOSIS — Z3A30 30 weeks gestation of pregnancy: Secondary | ICD-10-CM

## 2017-11-11 DIAGNOSIS — R112 Nausea with vomiting, unspecified: Secondary | ICD-10-CM

## 2017-11-11 DIAGNOSIS — O26899 Other specified pregnancy related conditions, unspecified trimester: Secondary | ICD-10-CM

## 2017-11-11 DIAGNOSIS — Z808 Family history of malignant neoplasm of other organs or systems: Secondary | ICD-10-CM | POA: Insufficient documentation

## 2017-11-11 DIAGNOSIS — R109 Unspecified abdominal pain: Secondary | ICD-10-CM | POA: Diagnosis present

## 2017-11-11 DIAGNOSIS — Z886 Allergy status to analgesic agent status: Secondary | ICD-10-CM | POA: Diagnosis not present

## 2017-11-11 DIAGNOSIS — Z8249 Family history of ischemic heart disease and other diseases of the circulatory system: Secondary | ICD-10-CM | POA: Diagnosis not present

## 2017-11-11 LAB — URINALYSIS, ROUTINE W REFLEX MICROSCOPIC
Bilirubin Urine: NEGATIVE
Glucose, UA: NEGATIVE mg/dL
Hgb urine dipstick: NEGATIVE
Ketones, ur: NEGATIVE mg/dL
Nitrite: NEGATIVE
Protein, ur: NEGATIVE mg/dL
Specific Gravity, Urine: 1.013 (ref 1.005–1.030)
pH: 6 (ref 5.0–8.0)

## 2017-11-11 MED ORDER — DOCUSATE SODIUM 100 MG PO CAPS: 100.0000 mg | ORAL_CAPSULE | Freq: Every day | ORAL | 2 refills | Status: DC | PRN

## 2017-11-11 MED ORDER — METOCLOPRAMIDE HCL 10 MG PO TABS: 10.0000 mg | ORAL_TABLET | Freq: Three times a day (TID) | ORAL | 0 refills | Status: DC

## 2017-11-11 MED ORDER — METOCLOPRAMIDE HCL 10 MG PO TABS
10.0000 mg | ORAL_TABLET | Freq: Once | ORAL | Status: AC
Start: 2017-11-11 — End: 2017-11-11
  Administered 2017-11-11: 10 mg via ORAL
  Filled 2017-11-11: qty 1

## 2017-11-11 NOTE — MAU Note (Signed)
Pt reports a lot of pressure in her lower abd x 2 days, vomiting since that time also.

## 2017-11-11 NOTE — MAU Note (Addendum)
Chief Complaint:  Abdominal Pain and Emesis   First Provider Initiated Contact with Patient 11/11/17 1107     HPI: Deborah Gibbs is a 27 y.o. G3P1011 at [redacted]w[redacted]d who presents to maternity admissions reporting N&V x 2 days and pelvic pressure. Pt endorse having nausea and vomiting throughout whole pregnancy, but takes zofran without improvement. Pt was placed on diclegis but is allergic to benadryl therefore the pharmacy flag it and would not fill it. Pt denies throwing up blood, no abdominal pain. Pt endorses she was check on Friday due to pelvic pressure and was told she was 2 cm dilated, but pt endorses felling more pressure recently. Pt denies wearing an abdominal belt at this time. Pt stated her last BM was one week ago and she usually goes three times weekly. Pt has taken mirilax with no relief. Pt denies dysuria, vaginal discharge, cp, sob, cp , HA, vision changes, or swelling .Denies contractions, leakage of fluid or vaginal bleeding. Good fetal movement.   Pregnancy Course:   Past Medical History:  Diagnosis Date  . Abortion   . Allergy   . Fibroid    OB History  Gravida Para Term Preterm AB Living  3 1 1   1 1   SAB TAB Ectopic Multiple Live Births    1   0 1    # Outcome Date GA Lbr Len/2nd Weight Sex Delivery Anes PTL Lv  3 Current           2 Term 12/05/15 [redacted]w[redacted]d 02:58 / 00:51 2.778 kg (6 lb 2 oz) M Vag-Spont EPI  LIV  1 TAB 2011           Past Surgical History:  Procedure Laterality Date  . WISDOM TOOTH EXTRACTION     Family History  Problem Relation Age of Onset  . Hypertension Father   . Cancer Maternal Grandmother        Breast Cancer  . Cancer Brother        neuroblastoma   Social History   Tobacco Use  . Smoking status: Never Smoker  . Smokeless tobacco: Never Used  Substance Use Topics  . Alcohol use: No  . Drug use: Not Currently    Types: Marijuana    Comment: 1-2 X/day   Allergies  Allergen Reactions  . Diphenhydramine Hcl Hives, Swelling and Other  (See Comments)    Reaction:  Facial/hand swelling  . Meloxicam Hives, Swelling and Other (See Comments)    Reaction:  Facial/hand swelling  Pt has tolerated ibuprofen in the past without reaction  . Penicillins Hives and Other (See Comments)    Has patient had a PCN reaction causing immediate rash, facial/tongue/throat swelling, SOB or lightheadedness with hypotension: No Has patient had a PCN reaction causing severe rash involving mucus membranes or skin necrosis: No Has patient had a PCN reaction that required hospitalization No Has patient had a PCN reaction occurring within the last 10 years: Yes If all of the above answers are "NO", then may proceed with Cephalosporin use.    Medications Prior to Admission  Medication Sig Dispense Refill Last Dose  . acetaminophen (TYLENOL) 500 MG tablet Take 1,000 mg by mouth every 6 (six) hours as needed for mild pain or headache.   11/10/2017 at Unknown time  . Iron-FA-B Cmp-C-Biot-Probiotic (FUSION PLUS PO) Take 1 tablet by mouth daily.   11/11/2017 at Unknown time  . ondansetron (ZOFRAN) 8 MG tablet Take 8 mg by mouth every 8 (eight) hours as needed  for nausea or vomiting.   11/11/2017 at Unknown time  . Prenatal Vit-Fe Fumarate-FA (PRENATAL MULTIVITAMIN) TABS tablet Take 1 tablet by mouth daily at 12 noon.   11/11/2017 at Unknown time  . acetaminophen (TYLENOL) 325 MG tablet Take 650 mg by mouth every 6 (six) hours as needed for headache (pain).   04/20/2017 at am  . ferrous sulfate 325 (65 FE) MG tablet Take 1 tablet (325 mg total) by mouth 2 (two) times daily with a meal. (Patient not taking: Reported on 01/05/2016) 30 tablet 3 Not Taking at Unknown time  . ibuprofen (ADVIL,MOTRIN) 200 MG tablet Take 800 mg by mouth every 6 (six) hours as needed for headache (pain).   04/15/2017    I have reviewed patient's Past Medical Hx, Surgical Hx, Family Hx, Social Hx, medications and allergies.   ROS:  Review of Systems  All other systems reviewed and are  negative.   Physical Exam   Patient Vitals for the past 24 hrs:  BP Temp Temp src Pulse Resp SpO2 Height Weight  11/11/17 1048 117/64 98.5 F (36.9 C) Oral 99 17 99 % 5\' 4"  (1.626 m) 83 kg (183 lb)   Constitutional: Well-developed, well-nourished female in no acute distress.  Cardiovascular: normal rate Respiratory: normal effort GI: Abd soft, non-tender, gravid appropriate for gestational age. Pos BS x 4 MS: Extremities nontender, no edema, normal ROM Neurologic: Alert and oriented x 4.  GU: Neg CVAT.  Pelvic: physiologic discharge, no blood. Pelvic adequate for labor. No CMT  Dilation: (internal os closed, external os 1 cm) Effacement (%): Thick Exam by:: J. Kloe Oates CNM  SVE: external os 1 cm, nternal os closed/Thick/High  NST: FHR baseline 135 bpm, Variability: moderate, Accelerations:present, Decelerations:  Absent= Cat 1/Reactive UC:   none SVE:   Dilation: (internal os closed, external os 1 cm) Effacement (%): Thick Exam by:: J. Nehal Shives CNM, vertex verified by fetal sutures.    Labs: Results for orders placed or performed during the hospital encounter of 11/11/17 (from the past 24 hour(s))  Urinalysis, Routine w reflex microscopic     Status: Abnormal   Collection Time: 11/11/17 11:16 AM  Result Value Ref Range   Color, Urine YELLOW YELLOW   APPearance CLOUDY (A) CLEAR   Specific Gravity, Urine 1.013 1.005 - 1.030   pH 6.0 5.0 - 8.0   Glucose, UA NEGATIVE NEGATIVE mg/dL   Hgb urine dipstick NEGATIVE NEGATIVE   Bilirubin Urine NEGATIVE NEGATIVE   Ketones, ur NEGATIVE NEGATIVE mg/dL   Protein, ur NEGATIVE NEGATIVE mg/dL   Nitrite NEGATIVE NEGATIVE   Leukocytes, UA MODERATE (A) NEGATIVE   RBC / HPF 0-5 0 - 5 RBC/hpf   WBC, UA 0-5 0 - 5 WBC/hpf   Bacteria, UA RARE (A) NONE SEEN   Squamous Epithelial / LPF 21-50 0 - 5   Mucus PRESENT     Imaging:  No results found.  MAU Course: Orders Placed This Encounter  Procedures  . Culture, Urine  . Urinalysis,  Routine w reflex microscopic  . Diet - low sodium heart healthy  . Increase activity slowly  . Call MD for:  . Call MD for:  temperature >100.4  . Call MD for:  persistant nausea and vomiting  . Call MD for:  severe uncontrolled pain  . Call MD for:  redness, tenderness, or signs of infection (pain, swelling, redness, odor or green/yellow discharge around incision site)  . Call MD for:  difficulty breathing, headache or visual disturbances  . Call  MD for:  hives  . Call MD for:  persistant dizziness or light-headedness  . Call MD for:  extreme fatigue  . (HEART FAILURE PATIENTS) Call MD:  Anytime you have any of the following symptoms: 1) 3 pound weight gain in 24 hours or 5 pounds in 1 week 2) shortness of breath, with or without a dry hacking cough 3) swelling in the hands, feet or stomach 4) if you have to sleep on extra pillows at night in order to breathe.  . Discharge patient Discharge disposition: 01-Home or Self Care; Discharge patient date: 11/11/2017 May discharge home, if tolerated PO challenge.   Meds ordered this encounter  Medications  . metoCLOPramide (REGLAN) tablet 10 mg  . docusate sodium (COLACE) 100 MG capsule    Sig: Take 1 capsule (100 mg total) by mouth daily as needed.    Dispense:  30 capsule    Refill:  2    Order Specific Question:   Supervising Provider    Answer:   Crawford Givens [[id number]]  . metoCLOPramide (REGLAN) 10 MG tablet    Sig: Take 1 tablet (10 mg total) by mouth 3 (three) times daily with meals.    Dispense:  15 tablet    Refill:  0    Order Specific Question:   Supervising Provider    Answer:   Crawford Givens [[id number]]   Assessment:  AASHKA SALOMONE is a 27 y.o. G3P1011 at [redacted]w[redacted]d with n/v , which nausea remain with reglan but vomiting has resolved, PO challenged tolerated, no episode of vomiting here.  1. [redacted] weeks gestation of pregnancy   2. Constipation, unspecified constipation type   3. Pelvic pressure in pregnancy   4. Non-intractable vomiting with  nausea, unspecified vomiting type     Plan: Discharge home in stable condition.  Constipation: Stool softer with mirilax BID, increase water, may do at home enema OTC, increase fiber, and exercise.  Pelvic pressure: Use abdominal belt. N/V: eat small meals ever 2 hours, may use reglan TID with meals for 5 days. Continue at home zofran. Take ginger.   Labor precautions and fetal kick counts Follow-up [facility] Follow up in 1 week(s).   Specialty:  Obstetrics and Gynecology Contact information: [address] [place] [address] [place] NP-C, [place], [place], [place] 11/11/2017 12:27 PM

## 2017-11-12 LAB — URINE CULTURE: Culture: <10000 — AB

## 2018-01-01 ENCOUNTER — Encounter (HOSPITAL_COMMUNITY): Payer: Self-pay | Admitting: *Deleted

## 2018-01-01 ENCOUNTER — Inpatient Hospital Stay (HOSPITAL_COMMUNITY)
Admission: AD | Admit: 2018-01-01 | Discharge: 2018-01-01 | Disposition: A | Discharge status: Home / Self Care | Payer: 59 | Source: Ambulatory Visit | Attending: Obstetrics and Gynecology | Admitting: Obstetrics and Gynecology

## 2018-01-01 DIAGNOSIS — Z3480 Encounter for supervision of other normal pregnancy, unspecified trimester: Secondary | ICD-10-CM

## 2018-01-01 DIAGNOSIS — Z3A39 39 weeks gestation of pregnancy: Secondary | ICD-10-CM | POA: Insufficient documentation

## 2018-01-01 DIAGNOSIS — O26893 Other specified pregnancy related conditions, third trimester: Secondary | ICD-10-CM | POA: Insufficient documentation

## 2018-01-01 LAB — URINALYSIS, ROUTINE W REFLEX MICROSCOPIC
Bilirubin Urine: NEGATIVE
Glucose, UA: NEGATIVE mg/dL
Ketones, ur: NEGATIVE mg/dL
Nitrite: NEGATIVE
Protein, ur: NEGATIVE mg/dL
Specific Gravity, Urine: 1.004 — ABNORMAL LOW (ref 1.005–1.030)
pH: 7 (ref 5.0–8.0)

## 2018-01-01 LAB — AMNISURE RUPTURE OF MEMBRANE (ROM) NOT AT ARMC: Amnisure ROM: NEGATIVE

## 2018-01-01 NOTE — Discharge Instructions (Signed)
Braxton Hicks Contractions Contractions of the uterus can occur throughout pregnancy, but they are not always a sign that you are in labor. You may have practice contractions called Braxton Hicks contractions. These false labor contractions are sometimes confused with true labor. What are Deborah Gibbs contractions? Braxton Hicks contractions are tightening movements that occur in the muscles of the uterus before labor. Unlike true labor contractions, these contractions do not result in opening (dilation) and thinning of the cervix. Toward the end of pregnancy (32-34 weeks), Braxton Hicks contractions can happen more often and may become stronger. These contractions are sometimes difficult to tell apart from true labor because they can be very uncomfortable. You should not feel embarrassed if you go to the hospital with false labor. Sometimes, the only way to tell if you are in true labor is for your health care provider to look for changes in the cervix. The health care provider will do a physical exam and may monitor your contractions. If you are not in true labor, the exam should show that your cervix is not dilating and your water has not broken. If there are other health problems associated with your pregnancy, it is completely safe for you to be sent home with false labor. You may continue to have Braxton Hicks contractions until you go into true labor. How to tell the difference between true labor and false labor True labor  Contractions last 30-70 seconds.  Contractions become very regular.  Discomfort is usually felt in the top of the uterus, and it spreads to the lower abdomen and low back.  Contractions do not go away with walking.  Contractions usually become more intense and increase in frequency.  The cervix dilates and gets thinner. False labor  Contractions are usually shorter and not as strong as true labor contractions.  Contractions are usually irregular.  Contractions  are often felt in the front of the lower abdomen and in the groin.  Contractions may go away when you walk around or change positions while lying down.  Contractions get weaker and are shorter-lasting as time goes on.  The cervix usually does not dilate or become thin. Follow these instructions at home:  Take over-the-counter and prescription medicines only as told by your health care provider.  Keep up with your usual exercises and follow other instructions from your health care provider.  Eat and drink lightly if you think you are going into labor.  If Braxton Hicks contractions are making you uncomfortable: ? Change your position from lying down or resting to walking, or change from walking to resting. ? Sit and rest in a tub of warm water. ? Drink enough fluid to keep your urine pale yellow. Dehydration may cause these contractions. ? Do slow and deep breathing several times an hour.  Keep all follow-up prenatal visits as told by your health care provider. This is important. Contact a health care provider if:  You have a fever.  You have continuous pain in your abdomen. Get help right away if:  Your contractions become stronger, more regular, and closer together.  You have fluid leaking or gushing from your vagina.  You have bleeding from your vagina.  You have low back pain that you never had before.  You feel your babys head pushing down and causing pelvic pressure.  Your baby is not moving inside you as much as it used to. Summary  Contractions that occur before labor are called Braxton Hicks contractions, false labor, or practice contractions.  Braxton Hicks contractions are usually shorter, weaker, farther apart, and less regular than true labor contractions. True labor contractions usually become progressively stronger and regular and they become more frequent.  Manage discomfort from [facility] contractions by changing position, resting in a warm bath,  drinking plenty of water, or practicing deep breathing. This information is not intended to replace advice given to you by your health care provider. Make sure you discuss any questions you have with your health care provider. Document Released: 08/09/2016 Document Revised: 08/09/2016 Document Reviewed: 08/09/2016 Elsevier Interactive Patient Education  2018 [employer].

## 2018-01-01 NOTE — MAU Note (Signed)
Pt C/O pelvic pressure & contractions, lost her mucus plug, ? Leaking fluid since yesterday.  States she feels fluid come out any time she has pressure.  Denies bleeding, reports good fetal movement.

## 2018-01-01 NOTE — MAU Note (Signed)
I have communicated with Dr. Mancel Bale and reviewed vital signs:  Vitals:   01/01/18 1732 01/01/18 1915  BP: 103/65 (!) 110/59  Pulse: 89 92  Resp: 18 18  Temp: 98.5 F (36.9 C)     Vaginal exam:  Dilation: 2.5 Effacement (%): 60 Cervical Position: Posterior Station: -3 Presentation: Vertex Exam by:: Velna Ochs RN,   Also reviewed contraction pattern and that non-stress test is reactive.  It has been documented that patient is contracting irregularly, SVE 2-3/60/03, amnisure is negative, not indicating active labor.  Patient denies any other complaints.  Based on this report provider has given order for discharge.  A discharge order and diagnosis entered by a provider.   Labor discharge instructions reviewed with patient.

## 2018-01-01 NOTE — MAU Provider Note (Signed)
S: Ms. KEYMANI GLYNN is a 27 y.o. G3P1011 at [redacted]w[redacted]d  who presents to MAU today for labor evaluation.     Cervical exam by RN:  Dilation: 2.5 Effacement (%): 60 Cervical Position: Posterior Station: -3 Presentation: Vertex Exam by:: Velna Ochs RN  Fetal Monitoring: Baseline: 120 Variability: moderate Accelerations: present Decelerations: none Contractions: rare, mild to palpation  MDM Discussed patient with RN. NST reviewed.   A: [facility] at [redacted]w[redacted]d  No evidence of ROM with negative amnisure  P: RN to call Dr Garlan Fillers, Kathie Dike, CNM 01/01/2018 6:43 PM

## 2018-01-09 ENCOUNTER — Encounter (HOSPITAL_COMMUNITY): Payer: Self-pay

## 2018-01-09 ENCOUNTER — Inpatient Hospital Stay (HOSPITAL_COMMUNITY)
Admission: AD | Admit: 2018-01-09 | Discharge: 2018-01-11 | DRG: 807 | Disposition: A | Discharge status: Home / Self Care | Payer: 59 | Attending: Obstetrics and Gynecology | Admitting: Obstetrics and Gynecology

## 2018-01-09 DIAGNOSIS — O26893 Other specified pregnancy related conditions, third trimester: Secondary | ICD-10-CM | POA: Diagnosis present

## 2018-01-09 DIAGNOSIS — Z880 Allergy status to penicillin: Secondary | ICD-10-CM

## 2018-01-09 DIAGNOSIS — Z3A39 39 weeks gestation of pregnancy: Secondary | ICD-10-CM

## 2018-01-09 DIAGNOSIS — Z6791 Unspecified blood type, Rh negative: Secondary | ICD-10-CM | POA: Diagnosis not present

## 2018-01-09 DIAGNOSIS — Z3483 Encounter for supervision of other normal pregnancy, third trimester: Secondary | ICD-10-CM | POA: Diagnosis present

## 2018-01-09 LAB — CBC
HCT: 31.2 % — ABNORMAL LOW (ref 36.0–46.0)
Hemoglobin: 10.3 g/dL — ABNORMAL LOW (ref 12.0–15.0)
MCH: 25.8 pg — ABNORMAL LOW (ref 26.0–34.0)
MCHC: 33 g/dL (ref 30.0–36.0)
MCV: 78.2 fL (ref 78.0–100.0)
Platelets: 247 10*3/uL (ref 150–400)
RBC: 3.99 MIL/uL (ref 3.87–5.11)
RDW: 15.1 % (ref 11.5–15.5)
WBC: 18 10*3/uL — ABNORMAL HIGH (ref 4.0–10.5)

## 2018-01-09 LAB — RPR: RPR Ser Ql: NONREACTIVE

## 2018-01-09 MED ORDER — WITCH HAZEL-GLYCERIN EX PADS: 1.0000 "application " | MEDICATED_PAD | CUTANEOUS | Status: DC | PRN

## 2018-01-09 MED ORDER — LACTATED RINGERS IV SOLN: INTRAVENOUS | Status: DC

## 2018-01-09 MED ORDER — LACTATED RINGERS IV SOLN: 500.0000 mL | INTRAVENOUS | Status: DC | PRN

## 2018-01-09 MED ORDER — SODIUM CHLORIDE 0.9 % IV SOLN: 250.0000 mL | INTRAVENOUS | Status: DC | PRN

## 2018-01-09 MED ORDER — OXYTOCIN 40 UNITS IN LACTATED RINGERS INFUSION - SIMPLE MED: 2.5000 [IU]/h | INTRAVENOUS | Status: DC

## 2018-01-09 MED ORDER — OXYCODONE-ACETAMINOPHEN 5-325 MG PO TABS: 1.0000 | ORAL_TABLET | ORAL | Status: DC | PRN

## 2018-01-09 MED ORDER — DIBUCAINE 1 % RE OINT: 1.0000 "application " | TOPICAL_OINTMENT | RECTAL | Status: DC | PRN

## 2018-01-09 MED ORDER — PHENYLEPHRINE 40 MCG/ML (10ML) SYRINGE FOR IV PUSH (FOR BLOOD PRESSURE SUPPORT)
80.0000 ug | PREFILLED_SYRINGE | INTRAVENOUS | Status: DC | PRN
  Filled 2018-01-09: qty 5
  Filled 2018-01-09: qty 10

## 2018-01-09 MED ORDER — SOD CITRATE-CITRIC ACID 500-334 MG/5ML PO SOLN: 30.0000 mL | ORAL | Status: DC | PRN

## 2018-01-09 MED ORDER — PHENYLEPHRINE 40 MCG/ML (10ML) SYRINGE FOR IV PUSH (FOR BLOOD PRESSURE SUPPORT)
80.0000 ug | PREFILLED_SYRINGE | INTRAVENOUS | Status: DC | PRN
  Filled 2018-01-09: qty 5

## 2018-01-09 MED ORDER — SODIUM CHLORIDE 0.9% FLUSH: 3.0000 mL | Freq: Two times a day (BID) | INTRAVENOUS | Status: DC

## 2018-01-09 MED ORDER — OXYTOCIN BOLUS FROM INFUSION
500.0000 mL | Freq: Once | INTRAVENOUS | Status: AC
  Administered 2018-01-09: 500 mL via INTRAVENOUS

## 2018-01-09 MED ORDER — OXYCODONE HCL 5 MG PO TABS: 10.0000 mg | ORAL_TABLET | ORAL | Status: DC | PRN

## 2018-01-09 MED ORDER — LACTATED RINGERS IV SOLN
500.0000 mL | INTRAVENOUS | Status: DC | PRN
  Administered 2018-01-09: 500 mL via INTRAVENOUS

## 2018-01-09 MED ORDER — FENTANYL 2.5 MCG/ML BUPIVACAINE 1/10 % EPIDURAL INFUSION (WH - ANES)
14.0000 mL/h | INTRAMUSCULAR | Status: DC | PRN
  Filled 2018-01-09: qty 100

## 2018-01-09 MED ORDER — FENTANYL CITRATE (PF) 100 MCG/2ML IJ SOLN
50.0000 ug | INTRAMUSCULAR | Status: DC | PRN
  Administered 2018-01-09: 50 ug via INTRAVENOUS
  Administered 2018-01-09: 100 ug via INTRAVENOUS
  Filled 2018-01-09: qty 2

## 2018-01-09 MED ORDER — OXYCODONE HCL 5 MG PO TABS: 5.0000 mg | ORAL_TABLET | ORAL | Status: DC | PRN

## 2018-01-09 MED ORDER — ZOLPIDEM TARTRATE 5 MG PO TABS: 5.0000 mg | ORAL_TABLET | Freq: Every evening | ORAL | Status: DC | PRN

## 2018-01-09 MED ORDER — BENZOCAINE-MENTHOL 20-0.5 % EX AERO
1.0000 "application " | INHALATION_SPRAY | CUTANEOUS | Status: DC | PRN
  Administered 2018-01-09: 1 via TOPICAL
  Filled 2018-01-09: qty 56

## 2018-01-09 MED ORDER — EPHEDRINE 5 MG/ML INJ
10.0000 mg | INTRAVENOUS | Status: DC | PRN
  Filled 2018-01-09: qty 2

## 2018-01-09 MED ORDER — COCONUT OIL OIL
1.0000 "application " | TOPICAL_OIL | Status: DC | PRN
  Filled 2018-01-09: qty 120

## 2018-01-09 MED ORDER — SODIUM CHLORIDE 0.9% FLUSH: 3.0000 mL | INTRAVENOUS | Status: DC | PRN

## 2018-01-09 MED ORDER — ONDANSETRON HCL 4 MG/2ML IJ SOLN: 4.0000 mg | Freq: Four times a day (QID) | INTRAMUSCULAR | Status: DC | PRN

## 2018-01-09 MED ORDER — ONDANSETRON HCL 4 MG/2ML IJ SOLN: 4.0000 mg | INTRAMUSCULAR | Status: DC | PRN

## 2018-01-09 MED ORDER — TETANUS-DIPHTH-ACELL PERTUSSIS 5-2.5-18.5 LF-MCG/0.5 IM SUSP: 0.5000 mL | Freq: Once | INTRAMUSCULAR | Status: DC

## 2018-01-09 MED ORDER — LIDOCAINE HCL (PF) 1 % IJ SOLN
30.0000 mL | INTRAMUSCULAR | Status: DC | PRN
  Filled 2018-01-09: qty 30

## 2018-01-09 MED ORDER — OXYCODONE-ACETAMINOPHEN 5-325 MG PO TABS: 2.0000 | ORAL_TABLET | ORAL | Status: DC | PRN

## 2018-01-09 MED ORDER — ACETAMINOPHEN 325 MG PO TABS
650.0000 mg | ORAL_TABLET | ORAL | Status: DC | PRN
  Administered 2018-01-09 – 2018-01-11 (×8): 650 mg via ORAL
  Filled 2018-01-09 (×9): qty 2

## 2018-01-09 MED ORDER — DIPHENHYDRAMINE HCL 50 MG/ML IJ SOLN: 12.5000 mg | INTRAMUSCULAR | Status: DC | PRN

## 2018-01-09 MED ORDER — LIDOCAINE HCL (PF) 1 % IJ SOLN
30.0000 mL | INTRAMUSCULAR | Status: AC | PRN
  Administered 2018-01-09: 30 mL via SUBCUTANEOUS
  Filled 2018-01-09: qty 30

## 2018-01-09 MED ORDER — SIMETHICONE 80 MG PO CHEW: 80.0000 mg | CHEWABLE_TABLET | ORAL | Status: DC | PRN

## 2018-01-09 MED ORDER — FENTANYL CITRATE (PF) 100 MCG/2ML IJ SOLN
INTRAMUSCULAR | Status: AC
  Filled 2018-01-09: qty 2

## 2018-01-09 MED ORDER — LACTATED RINGERS IV SOLN
INTRAVENOUS | Status: DC
  Administered 2018-01-09: 06:00:00 via INTRAVENOUS

## 2018-01-09 MED ORDER — FLEET ENEMA 7-19 GM/118ML RE ENEM: 1.0000 | ENEMA | RECTAL | Status: DC | PRN

## 2018-01-09 MED ORDER — RHO D IMMUNE GLOBULIN 1500 UNIT/2ML IJ SOSY
300.0000 ug | PREFILLED_SYRINGE | Freq: Once | INTRAMUSCULAR | Status: AC
  Administered 2018-01-09: 300 ug via INTRAMUSCULAR
  Filled 2018-01-09: qty 2

## 2018-01-09 MED ORDER — SENNOSIDES-DOCUSATE SODIUM 8.6-50 MG PO TABS
2.0000 | ORAL_TABLET | ORAL | Status: DC
  Administered 2018-01-09 – 2018-01-10 (×2): 2 via ORAL
  Filled 2018-01-09 (×2): qty 2

## 2018-01-09 MED ORDER — ACETAMINOPHEN 325 MG PO TABS: 650.0000 mg | ORAL_TABLET | ORAL | Status: DC | PRN

## 2018-01-09 MED ORDER — OXYTOCIN BOLUS FROM INFUSION: 500.0000 mL | Freq: Once | INTRAVENOUS | Status: DC

## 2018-01-09 MED ORDER — ONDANSETRON HCL 4 MG PO TABS: 4.0000 mg | ORAL_TABLET | ORAL | Status: DC | PRN

## 2018-01-09 MED ORDER — LACTATED RINGERS IV SOLN: 500.0000 mL | Freq: Once | INTRAVENOUS | Status: DC

## 2018-01-09 MED ORDER — OXYTOCIN 40 UNITS IN LACTATED RINGERS INFUSION - SIMPLE MED
2.5000 [IU]/h | INTRAVENOUS | Status: DC
  Filled 2018-01-09: qty 1000

## 2018-01-09 MED ORDER — PRENATAL MULTIVITAMIN CH
1.0000 | ORAL_TABLET | Freq: Every day | ORAL | Status: DC
  Administered 2018-01-09 – 2018-01-10 (×2): 1 via ORAL
  Filled 2018-01-09 (×2): qty 1

## 2018-01-09 MED ORDER — FERROUS SULFATE 325 (65 FE) MG PO TABS
325.0000 mg | ORAL_TABLET | Freq: Two times a day (BID) | ORAL | Status: DC
  Administered 2018-01-09 – 2018-01-11 (×4): 325 mg via ORAL
  Filled 2018-01-09 (×4): qty 1

## 2018-01-09 MED ORDER — MEASLES, MUMPS & RUBELLA VAC ~~LOC~~ INJ
0.5000 mL | INJECTION | Freq: Once | SUBCUTANEOUS | Status: DC
  Filled 2018-01-09: qty 0.5

## 2018-01-09 NOTE — MAU Note (Signed)
CTX since 0113.  No LOF/VB.  3 cm on last exam.  No complications with the pregnancy.  + FM.

## 2018-01-09 NOTE — H&P (Signed)
Deborah Gibbs is a 27 y.o. female presenting for active labor. OB History    Gravida  3   Para  1   Term  1   Preterm      AB  1   Living  1     SAB      TAB  1   Ectopic      Multiple  0   Live Births  1          Past Medical History:  Diagnosis Date  . Abortion   . Allergy   . Fibroid    Past Surgical History:  Procedure Laterality Date  . WISDOM TOOTH EXTRACTION     Family History: family history includes Cancer in her brother and maternal grandmother; Hypertension in her father. Social History:  reports that she has never smoked. She has never used smokeless tobacco. She reports that she has current or past drug history. Drug: Marijuana. She reports that she does not drink alcohol.     Maternal Diabetes: No Genetic Screening: Normal Maternal Ultrasounds/Referrals: Normal Fetal Ultrasounds or other Referrals:  None Maternal Substance Abuse:  No Significant Maternal Medications:  None Significant Maternal Lab Results:  Lab values include: Rh negative Other Comments:  None  Review of Systems  Gastrointestinal: Positive for abdominal pain.  All other systems reviewed and are negative.  Maternal Medical History:  Reason for admission: Contractions.   Contractions: Onset was 3-5 hours ago.   Frequency: regular.   Duration is approximately 60 seconds.   Perceived severity is strong.    Fetal activity: Perceived fetal activity is normal.   Last perceived fetal movement was within the past hour.    Prenatal Complications - Diabetes: none.    Dilation: 8 Effacement (%): 100 Station: -1 Exam by:: Lissa Merlin, CNM   Blood pressure 105/71, pulse 92, temperature 98.7 F (37.1 C), temperature source Oral, resp. rate 18, height 5\' 2"  (1.575 m), weight 81.5 kg, last menstrual period 04/14/2017, SpO2 100 %, unknown if currently breastfeeding.   Maternal Exam:  Uterine Assessment: Contraction strength is firm.  Contraction frequency is regular.   Abdomen:  Patient reports no abdominal tenderness. Fundal height is Size=dates.   Estimated fetal weight is 7lbs.   Fetal presentation: vertex  Introitus: Normal vulva. Normal vagina.  Pelvis: adequate for delivery.   Cervix: Cervix evaluated by digital exam.     Fetal Exam Fetal Monitor Review: Mode: ultrasound.   Baseline rate: 135.  Variability: moderate (6-25 bpm).   Pattern: accelerations present and variable decelerations.    Fetal State Assessment: Category II - tracings are indeterminate.     Physical Exam  Nursing note and vitals reviewed. Constitutional: She is oriented to person, place, and time. She appears well-developed and well-nourished.  HENT:  Head: Normocephalic.  Eyes: Pupils are equal, round, and reactive to light.  Cardiovascular: Normal rate, regular rhythm and normal heart sounds.  Respiratory: Effort normal and breath sounds normal.  GI: There is no tenderness.  Genitourinary: Vagina normal and uterus normal.  Musculoskeletal: Normal range of motion.  Neurological: She is alert and oriented to person, place, and time.  Skin: Skin is warm and dry.  Psychiatric: She has a normal mood and affect. Her behavior is normal. Judgment and thought content normal.    Prenatal labs: ABO, Rh: --/--/O NEG (05/05 1636) Antibody: NEG (05/05 1636) Rubella:  Immune RPR:   NR HBsAg:   NR HIV: Non Reactive (05/05 1636)  GBS:   Negative  Assessment/Plan: 27 y.o. G3P1 at [redacted]w[redacted]d Active labor Category II FHTs, occasional variable decels overall reassuring  Admit to L&D Anticipate NSVD Patient desires natural birth    Marikay Alar 01/09/2018, 6:47 AM

## 2018-01-09 NOTE — Lactation Note (Signed)
This note was copied from a baby's chart. Lactation Consultation Note  Patient Name: Deborah Gibbs [id number] Date: 01/09/2018 Reason for consult: Initial assessment;1st time breastfeeding;Term  P2 mother whose infant is now 74 hours old.  This is mother's first time breast feeding.  Baby was sleeping in father's arms when I arrived.  Mother feels like breastfeeding has gone well so far but stated that her nipples are sore.  Her breasts are soft and non tender and nipples are short shafted bilaterally with pink irritated tip.  Breast shells and coconut oil provided with instructions for use.  Also encouraged mother to rub EBM into nipples/areolas after feedings.  Encouraged to feed 8-12 times/24 hours or sooner if baby shows cues.  She is familiar with hand expression and will feed back any EBM she obtains using a spoon.    Mother stated that she was planning on breast feeding her first child but "he would never latch on."  I suggested she call her RN/LC if she needs latch assistance to help her be successful with this baby and breast feeding.  Mother will call as needed.  Mom made aware of O/P services, breastfeeding support groups, community resources, and our phone # for post-discharge questions. Family in room and supportive.  RN updated.   Maternal Data Formula Feeding for Exclusion: No Has patient been taught Hand Expression?: Yes Does the patient have breastfeeding experience prior to this delivery?: No  Feeding Feeding Type: Breast Milk  LATCH Score Latch: Too sleepy or reluctant, no latch achieved, no sucking elicited.                 Interventions Interventions: Breast feeding basics reviewed;Assisted with latch;Skin to skin;Breast massage;Hand express;Breast compression;Expressed milk  Lactation Tools Discussed/Used     Consult Status Consult Status: Follow-up Date: 01/10/18 Follow-up type: In-patient    Little Ishikawa 01/09/2018, 8:36 PM

## 2018-01-10 LAB — CBC
HCT: 30.8 % — ABNORMAL LOW (ref 36.0–46.0)
Hemoglobin: 10 g/dL — ABNORMAL LOW (ref 12.0–15.0)
MCH: 25.4 pg — ABNORMAL LOW (ref 26.0–34.0)
MCHC: 32.5 g/dL (ref 30.0–36.0)
MCV: 78.4 fL (ref 78.0–100.0)
Platelets: 250 10*3/uL (ref 150–400)
RBC: 3.93 MIL/uL (ref 3.87–5.11)
RDW: 15.2 % (ref 11.5–15.5)
WBC: 17.3 10*3/uL — ABNORMAL HIGH (ref 4.0–10.5)

## 2018-01-10 LAB — RH IG WORKUP (INCLUDES ABO/RH)
ABO/RH(D): O NEG
Fetal Screen: NEGATIVE
Gestational Age(Wks): 39.2
Unit division: 0

## 2018-01-10 LAB — BIRTH TISSUE RECOVERY COLLECTION (PLACENTA DONATION)

## 2018-01-10 NOTE — Progress Notes (Signed)
CSW received consult for hx of marijuana use.  Referral was screened out due to the following: °~MOB had no documented substance use after initial prenatal visit/+UPT. °~MOB had no positive drug screens after initial prenatal visit/+UPT. °~Baby's UDS is negative. ° °Please consult CSW if current concerns arise or by MOB's request. ° °CSW will monitor CDS results and make report to Child Protective Services if warranted. ° °Mikale Silversmith Boyd-Gilyard, MSW, LCSW °Clinical Social Work °[phone] ° °

## 2018-01-10 NOTE — Lactation Note (Signed)
This note was copied from a baby's chart. Lactation Consultation Note  Patient Name: Deborah Gibbs [id number] Date: 01/10/2018 Reason for consult: Follow-up assessment;Nipple pain/trauma Mom called out for latch assist due to pain with feedings.  Assisted with positioning baby in football hold.  Colostrum easily hand expressed.  Nipples erect and intact.  Baby opened wide and latched easily.  Good depth observed and mom comfortable with feeding.  Baby actively fed with good swallows.  Mom shown how to support breast and compress tissue to obtain good latch.  Encouraged to call for assist/concerns prn.  Maternal Data    Feeding Feeding Type: Breast Fed  LATCH Score Latch: Grasps breast easily, tongue down, lips flanged, rhythmical sucking.  Audible Swallowing: Spontaneous and intermittent  Type of Nipple: Everted at rest and after stimulation  Comfort (Breast/Nipple): Filling, red/small blisters or bruises, mild/mod discomfort  Hold (Positioning): Assistance needed to correctly position infant at breast and maintain latch.  LATCH Score: 8  Interventions    Lactation Tools Discussed/Used     Consult Status Consult Status: Follow-up Date: 01/11/18 Follow-up type: In-patient    Ave Filter 01/10/2018, 11:30 AM

## 2018-01-10 NOTE — Progress Notes (Signed)
Subjective: Postpartum Day # 1 : S/P NSVD due to spontaneous active labor. Patient up ad lib, denies syncope or dizziness. Reports consuming regular diet without issues and denies N/V. Patient reports 0 bowel movement + passing flatus.  Denies issues with urination and reports bleeding is "light."  Patient is Breastfeeding and reports going well.  Desires Nexplanon for postpartum contraception.  Pain is being appropriately managed with use of motrin.   Bi-lateral labial repaired laceration Feeding:  [place] Contraceptive plan:  Nexplanon Baby Female: Circ Out-pt desired.   Objective: Vital signs in last 24 hours: Patient Vitals for the past 24 hrs:  BP Temp Temp src Pulse Resp SpO2  01/10/18 0633 (!) 92/56 97.9 F (36.6 C) Oral 63 20 -  01/09/18 2347 (!) 103/55 98.2 F (36.8 C) Oral 62 19 -  01/09/18 2010 113/66 98.9 F (37.2 C) Oral 71 18 -  01/09/18 1530 104/62 98.9 F (37.2 C) Oral 89 18 99 %     Physical Exam:  General: alert, cooperative, appears stated age and no distress Mood/Affect: Happy Lungs: clear to auscultation, no wheezes, rales or rhonchi, symmetric air entry.  Heart: normal rate, regular rhythm, normal S1, S2, no murmurs, rubs, clicks or gallops. Breast: breasts appear normal, no suspicious masses, no skin or nipple changes or axillary nodes. Abdomen:  + bowel sounds, soft, non-tender GU: perineum approximate, healing well. No signs of external hematomas.  Uterine Fundus: firm Lochia: appropriate Skin: Warm, Dry. DVT Evaluation: No evidence of DVT seen on physical exam. Negative Homan's sign. No cords or calf tenderness. No significant calf/ankle edema.  CBC Latest Ref Rng & Units 01/10/2018 01/09/2018 12/04/2015  WBC 4.0 - 10.5 K/uL 17.3(H) 18.0(H) 15.3(H)  Hemoglobin 12.0 - 15.0 g/dL 10.0(L) 10.3(L) 7.9(L)  Hematocrit 36.0 - 46.0 % 30.8(L) 31.2(L) 23.8(L)  Platelets 150 - 400 K/uL 250 247 240    Results for orders placed or performed during the hospital  encounter of 01/09/18 (from the past 24 hour(s))  Rh IG workup (includes ABO/Rh)     Status: None (Preliminary result)   Collection Time: 01/09/18  2:04 PM  Result Value Ref Range   Gestational Age(Wks) 39.2    ABO/RH(D) O NEG    Fetal Screen NEG    Unit Number [id number]    Blood Component Type RHIG    Unit division 00    Status of Unit ISSUED    Transfusion Status      OK TO TRANSFUSE Performed at [facility], [address] [place], [place] [address]   CBC     Status: Abnormal   Collection Time: 01/10/18  5:22 AM  Result Value Ref Range   WBC 17.3 (H) 4.0 - 10.5 K/uL   RBC 3.93 3.87 - 5.11 MIL/uL   Hemoglobin 10.0 (L) 12.0 - 15.0 g/dL   HCT 30.8 (L) 36.0 - 46.0 %   MCV 78.4 78.0 - 100.0 fL   MCH 25.4 (L) 26.0 - 34.0 pg   MCHC 32.5 30.0 - 36.0 g/dL   RDW 15.2 11.5 - 15.5 %   Platelets 250 150 - 400 K/uL  Collect bld for placenta donatation     Status: None   Collection Time: 01/10/18  5:22 AM  Result Value Ref Range   Placenta donation bld collect COLLECTED BY LABORATORY      CBG (last 3)  No results for input(s): GLUCAP in the last 72 hours.   I/O last 3 completed shifts: In: -  Out: 299 [Blood:299]  Assessment Postpartum Day # 1 : S/P NSVD due to spontaneous active labor. Pt stable. -2 involution. Brestfeeding. Hemodynamically stable. Post delivery HGB 10 Plan: Continue other mgmt as ordered VTE prophylactics: Early ambulated as tolerates.  Pain control: Motrin/Tylenol PRN Baby Female; Out-pt circed needed.  Education given regarding options for contraception, including barrier methods, injectable contraception, IUD placement, oral contraceptives.  Plan for discharge tomorrow, Breastfeeding, Lactation consult and Contraception Nexplanon   Dr. Alesia Richards to be updated on patient status  Kunaal Walkins NP-C, CNM 01/10/2018, 12:30 PM

## 2018-01-11 MED ORDER — ACETAMINOPHEN 325 MG PO TABS: 650.0000 mg | ORAL_TABLET | Freq: Four times a day (QID) | ORAL | 0 refills | Status: DC | PRN

## 2018-01-11 NOTE — Discharge Summary (Signed)
OB Discharge Summary     Patient Name: Deborah Gibbs DOB: 09-19-1990 MRN: [id number]  Date of admission: 01/09/2018 Delivering MD: Delsa Bern   Date of discharge: 01/11/2018  Admitting diagnosis: 39WKS CTX Intrauterine pregnancy: [redacted]w[redacted]d     Secondary diagnosis:  Active Problems:   Normal labor     Discharge diagnosis: Term Pregnancy Delivered                                                                                                Post partum procedures:n/a  Augmentation: AROM  Complications: None  Hospital course:  Onset of Labor With Vaginal Delivery     27 y.o. yo [id number] at [redacted]w[redacted]d was admitted in Active Labor on 01/09/2018. Patient had an uncomplicated labor course as follows:  Membrane Rupture Time/Date: 7:22 AM ,01/09/2018   Intrapartum Procedures: Episiotomy: None [1]                                         Lacerations:  Labial [10]  Patient had a delivery of a Viable infant. 01/09/2018  Information for the patient's newborn:  Magdalene, Tardiff [[id number]]  Delivery Method: Vag-Spont    Pateint had an uncomplicated postpartum course.  She is ambulating, tolerating a regular diet, passing flatus, and urinating well. Patient is discharged home in stable condition on 01/11/18.   Physical exam  Vitals:   01/10/18 0633 01/10/18 1436 01/10/18 2301 01/11/18 0503  BP: (!) 92/56 108/66 103/74 115/67  Pulse: 63 69 60 71  Resp: 20 17  16   Temp: 97.9 F (36.6 C) 98.6 F (37 C)  98.1 F (36.7 C)  TempSrc: Oral Oral  Oral  SpO2:  99%  98%  Weight:      Height:       General: alert, cooperative and no distress Lochia: appropriate Uterine Fundus: firm Incision: N/A DVT Evaluation: No evidence of DVT seen on physical exam. Negative Homan's sign. No cords or calf tenderness. No significant calf/ankle edema.  Labs: Lab Results  Component Value Date   WBC 17.3 (H) 01/10/2018   HGB 10.0 (L) 01/10/2018   HCT 30.8 (L) 01/10/2018   MCV 78.4 01/10/2018   PLT 250  01/10/2018   Discharge instruction: per After Visit Summary and "Baby and Me Booklet".  After visit meds:  Allergies as of 01/11/2018      Reactions   Diphenhydramine Hcl Hives, Swelling, Other (See Comments)   Reaction:  Facial/hand swelling   Meloxicam Hives, Swelling, Other (See Comments)   Reaction:  Facial/hand swelling  Pt has tolerated ibuprofen in the past without reaction   Penicillins Hives, Other (See Comments)   Has patient had a PCN reaction causing immediate rash, facial/tongue/throat swelling, SOB or lightheadedness with hypotension: No Has patient had a PCN reaction causing severe rash involving mucus membranes or skin necrosis: No Has patient had a PCN reaction that required hospitalization No Has patient had a PCN reaction occurring within the last 10 years: Yes If all of the  above answers are "NO", then may proceed with Cephalosporin use.      Medication List    STOP taking these medications   FUSION PLUS PO   metoCLOPramide 10 MG tablet Commonly known as:  REGLAN   ondansetron 8 MG tablet Commonly known as:  ZOFRAN     TAKE these medications   acetaminophen 325 MG tablet Commonly known as:  TYLENOL Take 2 tablets (650 mg total) by mouth every 6 (six) hours as needed for mild pain or moderate pain. What changed:    medication strength  how much to take  reasons to take this   docusate sodium 100 MG capsule Commonly known as:  COLACE Take 1 capsule (100 mg total) by mouth daily as needed. What changed:  reasons to take this   prenatal multivitamin Tabs tablet Take 1 tablet by mouth daily at 12 noon.       Diet: routine diet  Activity: Advance as tolerated. Pelvic rest for 6 weeks.   Outpatient follow up:5 weeks for postpartum visit, 6 weeks for Nexplanon insertion Follow up Appt:No future appointments. Follow up Visit:No follow-ups on file.  Postpartum contraception: Nexplanon  Newborn Data: Live born female  Birth Weight: 6 lb 12.5 oz  (3075 g) APGAR: 9, 9  Newborn Delivery   Birth date/time:  01/09/2018 08:20:00 Delivery type:  Vaginal, Spontaneous     Baby Feeding: Breast Disposition:home with mother   01/11/2018 Marikay Alar, CNM

## 2018-01-11 NOTE — Lactation Note (Signed)
This note was copied from a baby's chart. Lactation Consultation Note  Patient Name: Deborah Gibbs [id number] Date: 01/11/2018 Reason for consult: Follow-up assessment;Term Mom reports feedings have gone well since latch assist yesterday.  Answered questions and discussed milk coming to volume.  Lactation outpatient services and support reviewed and encouraged prn.  Maternal Data    Feeding Feeding Type: Breast Fed  LATCH Score                   Interventions    Lactation Tools Discussed/Used     Consult Status Consult Status: Complete Follow-up type: Call as needed    Ave Filter 01/11/2018, 9:38 AM

## 2018-01-13 LAB — BPAM RBC
Blood Product Expiration Date: 201911042359
Blood Product Expiration Date: 201911052359
Unit Type and Rh: 9500
Unit Type and Rh: 9500

## 2018-01-13 LAB — TYPE AND SCREEN
ABO/RH(D): O NEG
Antibody Screen: POSITIVE
Unit division: 0
Unit division: 0

## 2018-05-08 ENCOUNTER — Encounter (HOSPITAL_COMMUNITY): Payer: Self-pay

## 2018-05-08 ENCOUNTER — Emergency Department (HOSPITAL_COMMUNITY)
Admission: EM | Admit: 2018-05-08 | Discharge: 2018-05-09 | Disposition: A | Discharge status: Home / Self Care | Payer: 59 | Attending: Emergency Medicine | Admitting: Emergency Medicine

## 2018-05-08 ENCOUNTER — Other Ambulatory Visit: Payer: Self-pay

## 2018-05-08 DIAGNOSIS — R0602 Shortness of breath: Secondary | ICD-10-CM | POA: Diagnosis not present

## 2018-05-08 DIAGNOSIS — T7840XA Allergy, unspecified, initial encounter: Secondary | ICD-10-CM | POA: Insufficient documentation

## 2018-05-08 MED ORDER — ONDANSETRON HCL 4 MG/2ML IJ SOLN
4.0000 mg | Freq: Once | INTRAMUSCULAR | Status: AC
  Administered 2018-05-08: 4 mg via INTRAVENOUS
  Filled 2018-05-08: qty 2

## 2018-05-08 MED ORDER — IPRATROPIUM-ALBUTEROL 0.5-2.5 (3) MG/3ML IN SOLN
3.0000 mL | Freq: Once | RESPIRATORY_TRACT | Status: AC
  Administered 2018-05-08: 3 mL via RESPIRATORY_TRACT
  Filled 2018-05-08: qty 3

## 2018-05-08 MED ORDER — EPINEPHRINE 0.3 MG/0.3ML IJ SOAJ
INTRAMUSCULAR | Status: AC
  Administered 2018-05-08: 0.3 mg
  Filled 2018-05-08: qty 0.3

## 2018-05-08 MED ORDER — METHYLPREDNISOLONE SODIUM SUCC 125 MG IJ SOLR
125.0000 mg | Freq: Once | INTRAMUSCULAR | Status: AC
  Administered 2018-05-08: 125 mg via INTRAVENOUS
  Filled 2018-05-08: qty 2

## 2018-05-08 MED ORDER — FAMOTIDINE IN NACL 20-0.9 MG/50ML-% IV SOLN
20.0000 mg | Freq: Once | INTRAVENOUS | Status: AC
  Administered 2018-05-08: 20 mg via INTRAVENOUS
  Filled 2018-05-08: qty 50

## 2018-05-08 MED ORDER — HYDROXYZINE HCL 25 MG PO TABS
25.0000 mg | ORAL_TABLET | Freq: Once | ORAL | Status: AC
  Administered 2018-05-08: 25 mg via ORAL
  Filled 2018-05-08: qty 1

## 2018-05-08 NOTE — ED Provider Notes (Signed)
[facility] DEPT Provider Note   CSN: [id number] Arrival date & time: 05/08/18  2220     History   Chief Complaint Chief Complaint  Patient presents with  . Allergic Reaction    HPI Deborah Gibbs is a 28 y.o. female history of uterine fibroid and allergies to penicillin, meloxicam, and Benadryl who presents to the emergency department with a chief complaint of allergic reaction.  The patient reports that approximately 20 minutes prior to arrival that she was attempting to fill a syringe of amoxicillin to administer to her son.  She reports that while she was filling it that some of the amoxicillin got in her mouth and approximately 5 to 6 minutes later she began feeling itchy.  She reports that she then developed nausea, shortness of breath, and feelings as if her throat is closing.  She reports a similar reaction to when she had previously taken penicillin.  She denies fever, chills, chest pain, paresthesias, numbness, weakness, vomiting, rash, syncope, dizziness, or lightheadedness.  No treatment prior to arrival.  The history is provided by the patient. No language interpreter was used.    Past Medical History:  Diagnosis Date  . Abortion   . Allergy   . Fibroid     Patient Active Problem List   Diagnosis Date Noted  . Normal labor 01/09/2018  . Supervision of other normal pregnancy, antepartum 08/29/2016    Past Surgical History:  Procedure Laterality Date  . WISDOM TOOTH EXTRACTION       OB History    Gravida  3   Para  2   Term  2   Preterm      AB  1   Living  2     SAB      TAB  1   Ectopic      Multiple  0   Live Births  2            Home Medications    Prior to Admission medications   Medication Sig Start Date End Date Taking? Authorizing Provider  EPINEPHrine (EPIPEN 2-PAK) 0.3 mg/0.3 mL IJ SOAJ injection Inject 0.3 mLs (0.3 mg total) into the muscle as needed for anaphylaxis. 05/09/18   ,   A, PA-C  predniSONE (DELTASONE) 10 MG tablet Take 2 tablets (20 mg total) by mouth daily with breakfast for 4 days. 05/09/18 05/13/18  , Laymond Purser, PA-C    Family History Family History  Problem Relation Age of Onset  . Hypertension Father   . Cancer Maternal Grandmother        Breast Cancer  . Cancer Brother        neuroblastoma    Social History Social History   Tobacco Use  . Smoking status: Never Smoker  . Smokeless tobacco: Never Used  Substance Use Topics  . Alcohol use: No  . Drug use: Not Currently    Types: Marijuana    Comment: 1-2 X/day     Allergies   Amoxicillin; Diphenhydramine hcl; Meloxicam; and Penicillins   Review of Systems Review of Systems  Constitutional: Negative for activity change, chills and fever.  HENT: Positive for sore throat and trouble swallowing.   Respiratory: Positive for shortness of breath. Negative for cough and chest tightness.   Cardiovascular: Negative for chest pain, palpitations and leg swelling.  Gastrointestinal: Positive for nausea. Negative for abdominal pain, constipation, diarrhea and vomiting.  Genitourinary: Negative for dysuria.  Musculoskeletal: Negative for back pain, myalgias, neck pain  and neck stiffness.  Skin: Negative for rash.       Pruritus  Allergic/Immunologic: Negative for immunocompromised state.  Neurological: Negative for dizziness, seizures, syncope, weakness and headaches.  Psychiatric/Behavioral: Negative for confusion.     Physical Exam Updated Vital Signs BP (!) 105/58   Pulse 73   Temp 98.2 F (36.8 C) (Oral)   Resp (!) 22   Ht 5\' 4"  (1.626 m)   Wt 77.1 kg   SpO2 96%   BMI 29.18 kg/m   Physical Exam Vitals signs and nursing note reviewed.  Constitutional:      General: She is not in acute distress.    Comments: Appears extremely anxious  HENT:     Head: Normocephalic.     Comments: Posterior oropharynx is patent.  No angioedema.  Tongue is not edematous.  Neck is soft and  supple. Eyes:     General: No scleral icterus.    Conjunctiva/sclera: Conjunctivae normal.  Neck:     Musculoskeletal: Neck supple.  Cardiovascular:     Rate and Rhythm: Normal rate and regular rhythm.     Heart sounds: No murmur. No friction rub. No gallop.   Pulmonary:     Effort: Pulmonary effort is normal. Tachypnea present. No respiratory distress.     Breath sounds: No stridor. No wheezing, rhonchi or rales.  Chest:     Chest wall: No tenderness.  Abdominal:     General: There is no distension.     Palpations: Abdomen is soft. There is no mass.     Tenderness: There is no abdominal tenderness. There is no right CVA tenderness, left CVA tenderness, guarding or rebound.     Hernia: No hernia is present.  Skin:    General: Skin is warm.     Capillary Refill: Capillary refill takes less than 2 seconds.     Findings: No rash.     Comments: No rash.  Neurological:     Mental Status: She is alert.  Psychiatric:        Behavior: Behavior normal.      ED Treatments / Results  Labs (all labs ordered are listed, but only abnormal results are displayed) Labs Reviewed - No data to display  EKG None  Radiology No results found.  Procedures Procedures (including critical care time)  Medications Ordered in ED Medications  EPINEPHrine (EPI-PEN) 0.3 mg/0.3 mL injection (0.3 mg  Given 05/08/18 2244)  methylPREDNISolone sodium succinate (SOLU-MEDROL) 125 mg/2 mL injection 125 mg (125 mg Intravenous Given 05/08/18 2250)  famotidine (PEPCID) IVPB 20 mg premix (0 mg Intravenous Stopped 05/08/18 2325)  ondansetron (ZOFRAN) injection 4 mg (4 mg Intravenous Given 05/08/18 2251)  ipratropium-albuterol (DUONEB) 0.5-2.5 (3) MG/3ML nebulizer solution 3 mL (3 mLs Nebulization Given 05/08/18 2327)  hydrOXYzine (ATARAX/VISTARIL) tablet 25 mg (25 mg Oral Given 05/08/18 2326)     Initial Impression / Assessment and Plan / ED Course  I have reviewed the triage vital signs and the nursing  notes.  Pertinent labs & imaging results that were available during my care of the patient were reviewed by me and considered in my medical decision making (see chart for details).  28 year old female presenting with shortness of breath, feeling as if her throat is closing, pruritus, and nausea after swallowing a small amount of amoxicillin approximately 20 minutes prior to arrival.  She has a known allergy to penicillin.  The patient was seen and evaluated along with Dr. Verner Chol, attending physician.  She is tachypneic, but has  no tachycardia.  Normotensive.  Will administer epinephrine, Solu-Medrol, Pepcid.  She is unable to take Benadryl as she is also allergic.  Clinical Course as of May 09 222  Thu May 08, 2018  2315 Patient recheck.  She reports she feels as if her throat is no longer closing.  She is still itchy.  She is noted to have frequent shallow breathing, but lungs are clear to auscultation bilaterally.  The patient was also reassessed by Dr. Verner Chol, attending physician.  Suspect symptoms are driven by anxiety.  Will order hydroxyzine and give a nebulizer treatment.   [MM]  Fri May 09, 2018  0205 On reevaluation, the patient is resting comfortably.  Her symptoms have resolved.  We will plan for discharge after observation of 4 hours has been completed.   [MM]    Clinical Course User Index [MM] ,  A, PA-C   After 4-hour observation.,  The patient is safe for discharge to home.  Will discharge her with an EpiPen for home use and burst of prednisone. Amoxicillin has been added as an allergy to her chart.  Since she is breast-feeding, she was also counseled that some of the medications that she was administer may be present in breast milk.  I encouraged her to pump and dispose of this breast milk prior to breast-feeding her infant.  All questions were answered and she acknowledged this information.  Strict return precautions given. She is hemodynamically stable.  She is  safe for discharge to home with outpatient follow-up.  Final Clinical Impressions(s) / ED Diagnoses   Final diagnoses:  Allergic reaction, initial encounter    ED Discharge Orders         Ordered    EPINEPHrine (EPIPEN 2-PAK) 0.3 mg/0.3 mL IJ SOAJ injection  As needed     05/09/18 0200    predniSONE (DELTASONE) 10 MG tablet  Daily with breakfast     05/09/18 0203           ,  A, PA-C 05/09/18 3559    Sherwood Gambler, MD 05/13/18 [phone]

## 2018-05-08 NOTE — ED Triage Notes (Signed)
Pt accidentally got amoxicillin in her mouth while she was trying to medicate her son. Pt has never taken amox and has no hx of allergic reaction to it. Pt does state she is allergic to benadryl.

## 2018-05-09 MED ORDER — EPINEPHRINE 0.3 MG/0.3ML IJ SOAJ: 0.3000 mg | INTRAMUSCULAR | 0 refills | Status: DC | PRN

## 2018-05-09 MED ORDER — PREDNISONE 10 MG PO TABS: 20.0000 mg | ORAL_TABLET | Freq: Every day | ORAL | 0 refills | Status: AC

## 2018-05-09 NOTE — ED Notes (Signed)
Pt resting in bed. Pt states she feels much better now, and is able to breathe better. Pt is visually in less distress than when she arrived to the ED. Pt seems much more comfortable. All lung fields are clear, as well as speech.

## 2018-05-09 NOTE — ED Notes (Signed)
Pt verbalizes understanding of discharge instructions, prescriptions, and when to return to the ED. Topaz pad is not currently working so RN is unable to obtain signature prior to discharge.

## 2018-05-09 NOTE — Discharge Instructions (Signed)
Thank you for allowing me to care for you today in the Emergency Department.   Use caution the next time you are breast-feeding a 7 the medications that you have been given today may cause drowsiness because they can be passed through breastmilk.  I would recommend pumping when you get home and not using this milk.  You should be able to start breast-feeding again after doing this 1 time.  Amoxicillin has been added to your chart as an allergy.  Take 2 tablets of prednisone with breakfast daily for the next 4 days.  I have also given you a prescription for an EpiPen in case you have a severe allergic reaction at home in the future.  Use only if you have a reaction to something where you develop significant difficulty breathing or throat closing.  Return to the emergency department if you have another severe allergic reaction, difficulty breathing, feeling as if your throat is closing, if you began to head to toe rash, develop vomiting, or other new, concerning symptoms.

## 2018-07-20 IMAGING — CT CT MAXILLOFACIAL W/O CM
3 of 7 series · 15 of 47 positions shown, 18 images · non-contrast
Comparison: None.

CLINICAL DATA: Left-sided headache and blurry vision to the left
eye

EXAM:
CT HEAD WITHOUT CONTRAST
CT MAXILLOFACIAL WITHOUT CONTRAST
TECHNIQUE: Multidetector CT imaging of the head and maxillofacial structures
were performed using the standard protocol without intravenous
contrast. Multiplanar CT image reconstructions of the maxillofacial
structures were also generated.

[Series 5: head bone · axial · 0.40mm/px · z∈[-145,-25]mm · 10 of 72 slices shown, 13 images]
[im 6/72  brain]
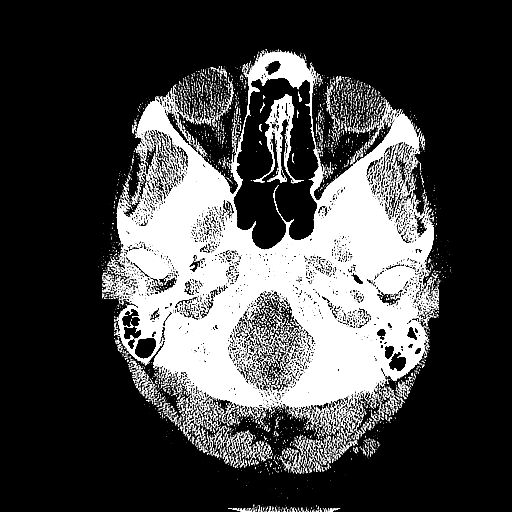
[im 6/72  bone]
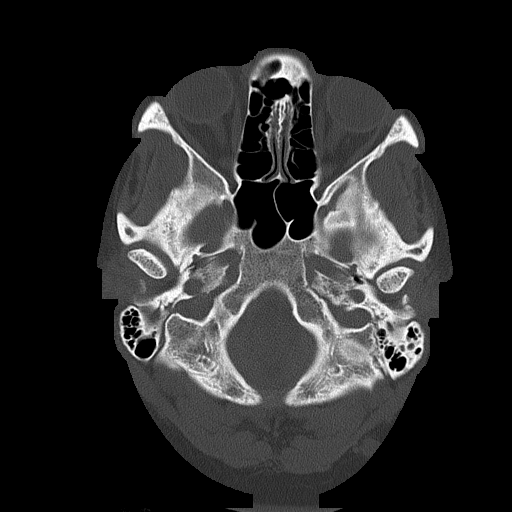
[im 12/72  bone]
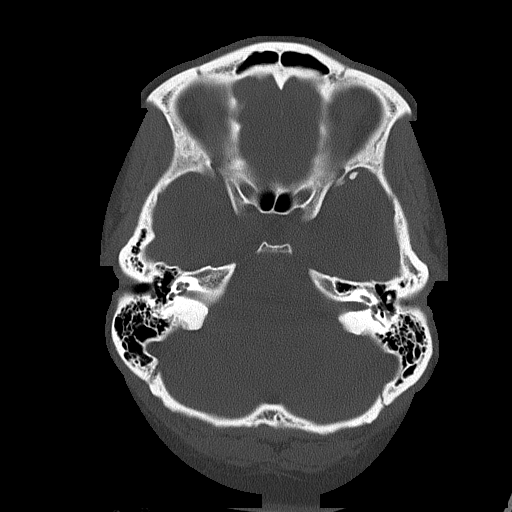
[im 18/72  bone]
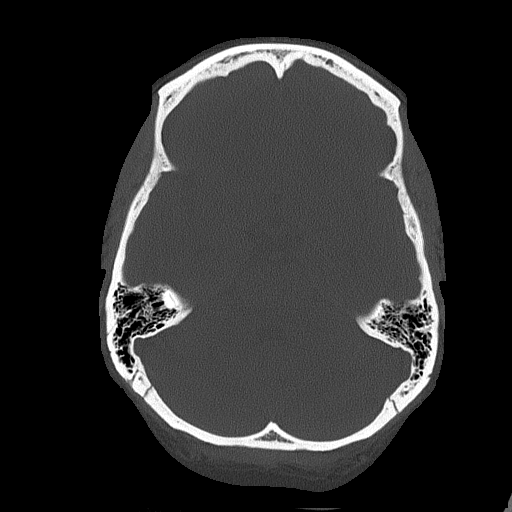
[im 24/72  bone]
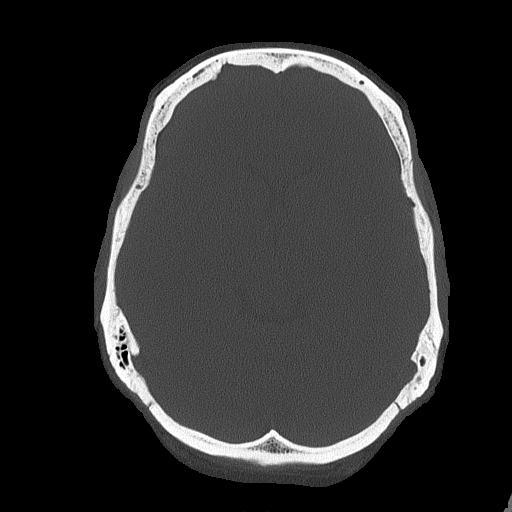
[im 30/72  brain]
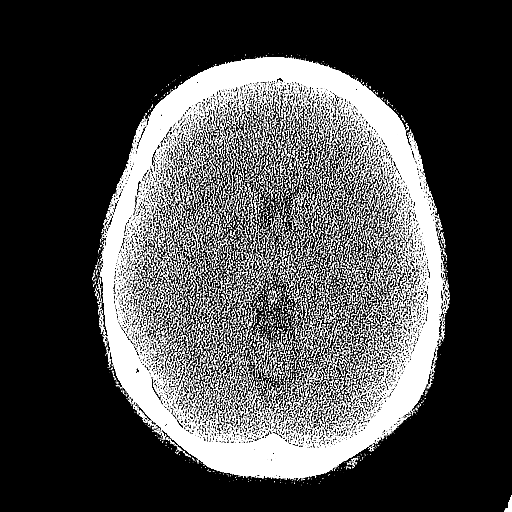
[im 30/72  bone]
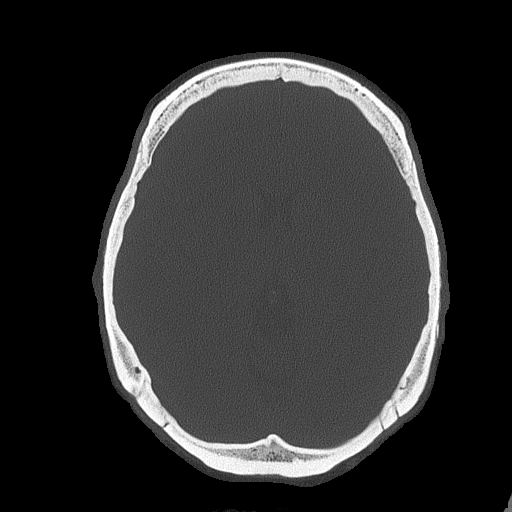
[im 42/72  bone]
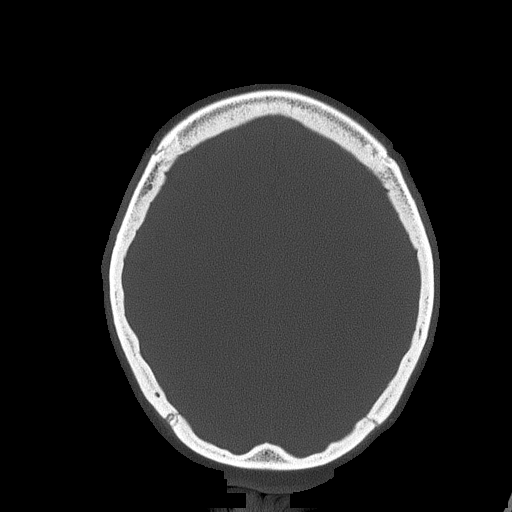
[im 48/72  bone]
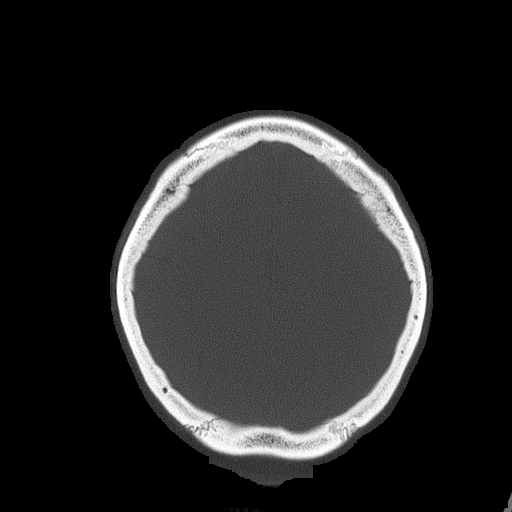
[im 54/72  bone]
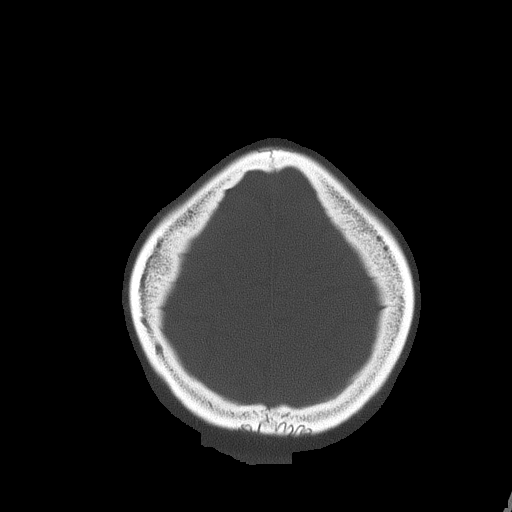
[im 60/72  brain]
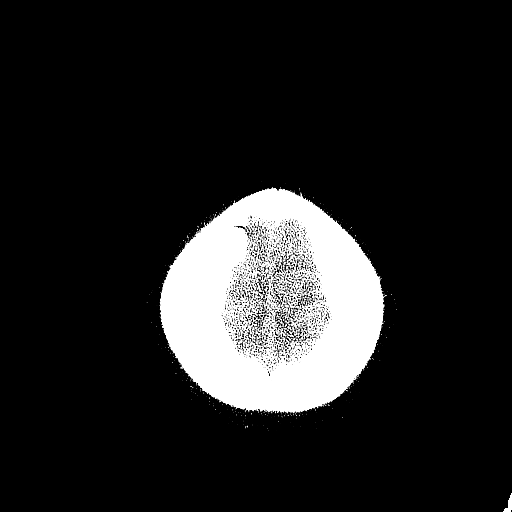
[im 60/72  bone]
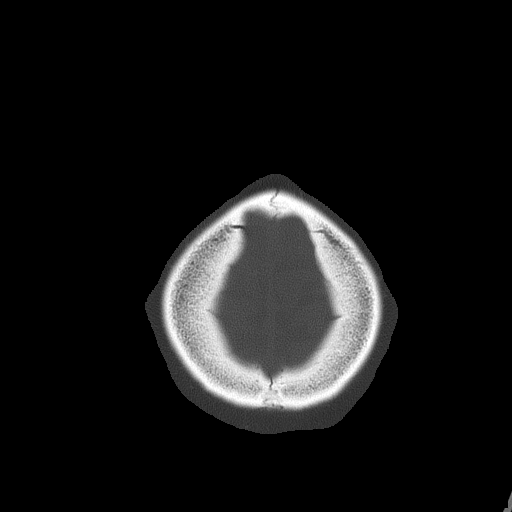
[im 66/72  bone]
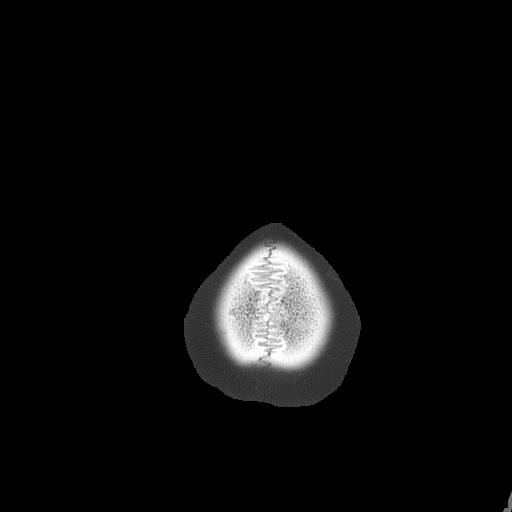

[Series 12: facialbone 2.0 cor st · coronal · 0.28mm/px · 3 of 75 slices shown]
[im 15/75  bone]
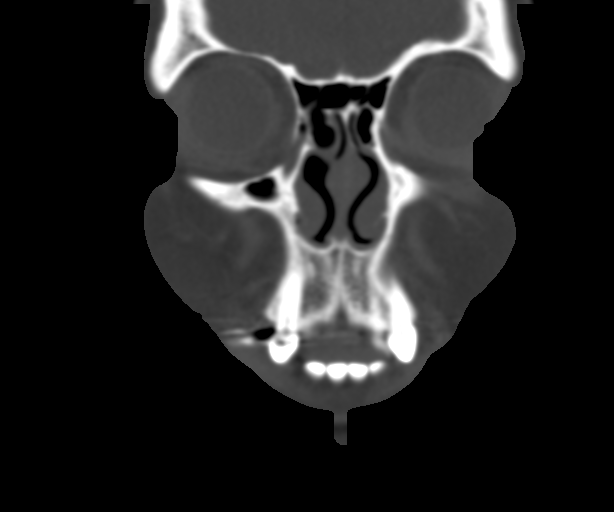
[im 30/75  bone]
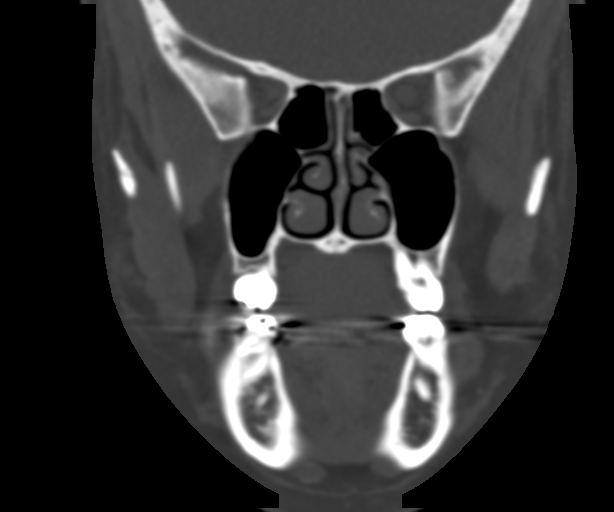
[im 45/75  bone]
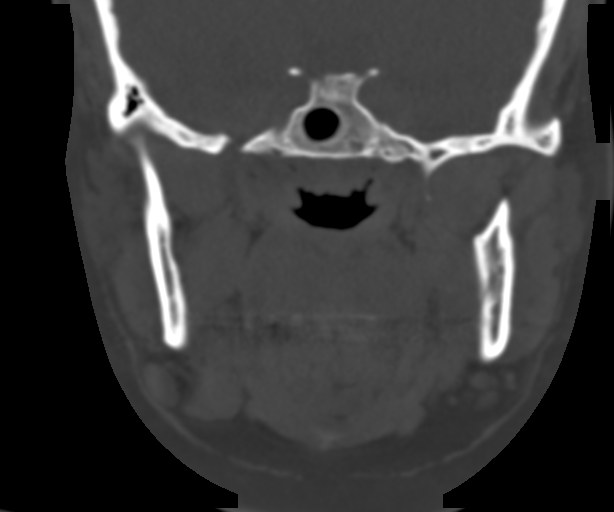

[Series 13: facialbone 2.0 sag st · sagittal · 0.28mm/px · 2 of 70 slices shown]
[im 24/70  bone]
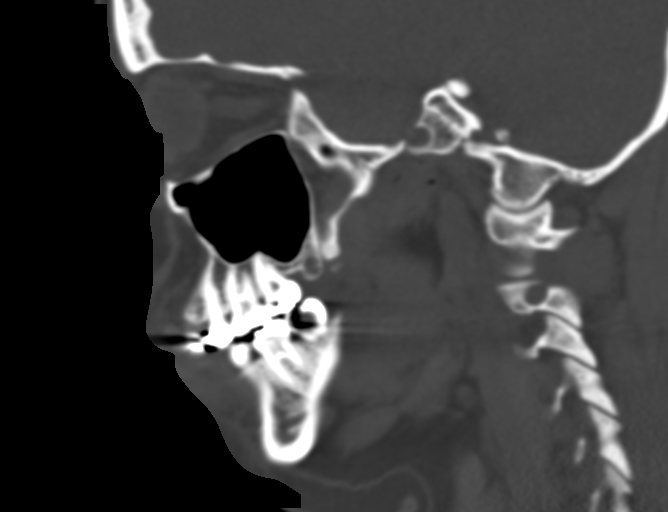
[im 47/70  bone]
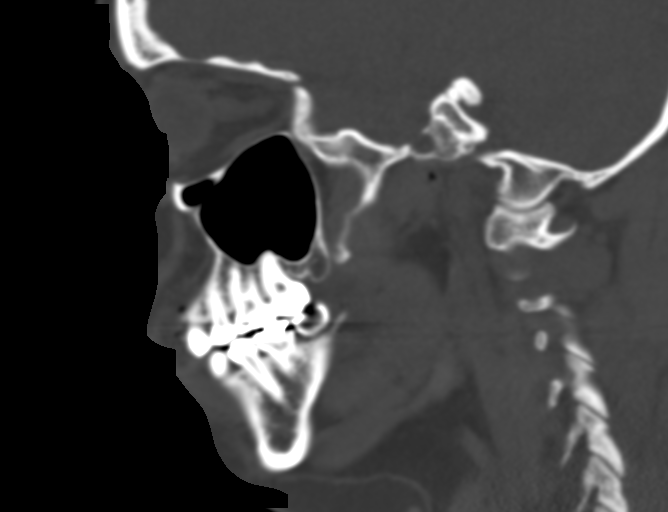

[15 of 47 positions shown; findings below may reference images not displayed]

FINDINGS: CT HEAD FINDINGS

Brain: No evidence of acute infarction, hemorrhage, hydrocephalus,
extra-axial collection or mass lesion/mass effect.

Vascular: No hyperdense vessel or unexpected calcification.

Skull: Normal. Negative for fracture or focal lesion.

Other: None

CT MAXILLOFACIAL FINDINGS

Osseous: Mandibular heads are normally position. No mandibular
fracture. Pterygoid plates, zygomatic arches and nasal bones are
intact

Orbits: Negative. No traumatic or inflammatory finding.

Sinuses: Small mucous retention cyst in the left maxillary sinus.
Mild mucosal thickening in the ethmoid sinuses. No acute fluid
levels or sinus wall fracture

Soft tissues: Negative.
IMPRESSION: 1. No CT evidence for acute intracranial abnormality.
2. Minimal sinus disease. No acute osseous abnormality. Bilateral
orbits are within normal limits.

## 2018-09-22 IMAGING — US US OB COMP LESS 14 WK
1 series · 15 of 28 positions shown · non-contrast
Comparison: None for this gestation

CLINICAL DATA: RIGHT pelvic pain

EXAM:
OBSTETRIC <14 WK ULTRASOUND
TECHNIQUE: Transabdominal ultrasound was performed for evaluation of the
gestation as well as the maternal uterus and adnexal regions.

[Series 1: us ob comp less 14 wk · 30 acquisitions, 15 frames shown]
[im 1/30]
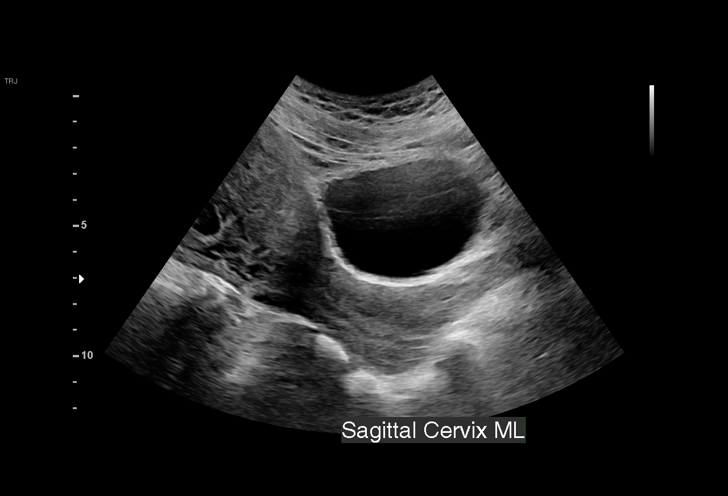
[im 3/30]
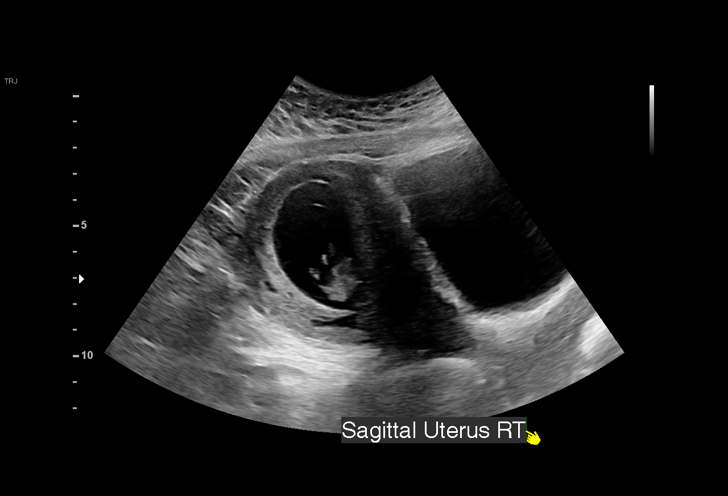
[im 5/30]
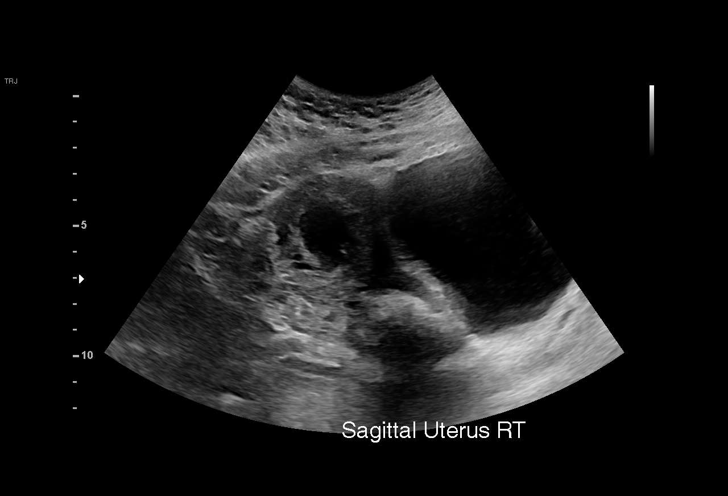
[im 7/30]
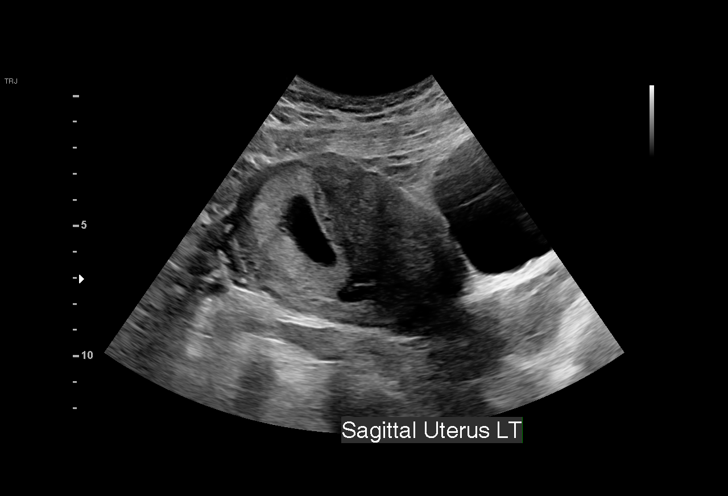
[im 9/30]
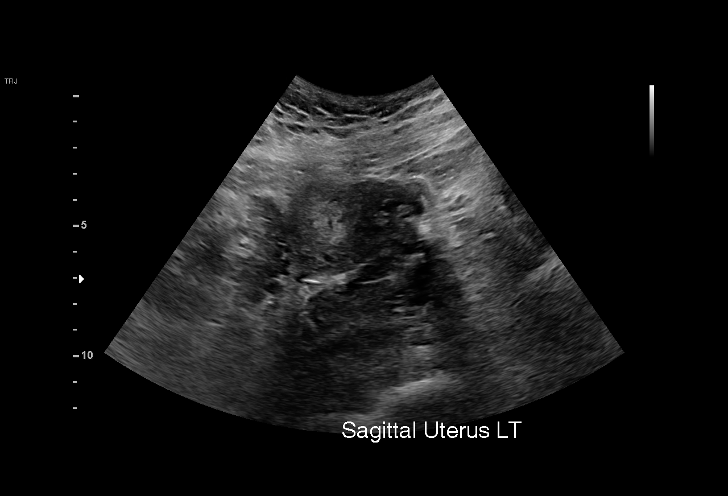
[im 11/30]
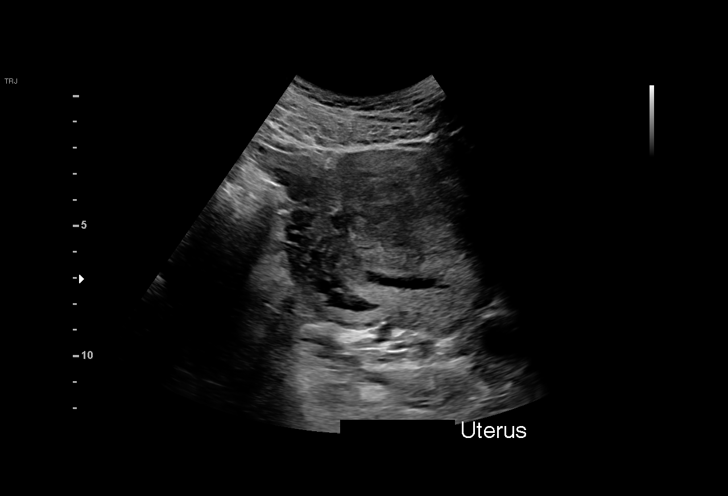
[im 13/30]
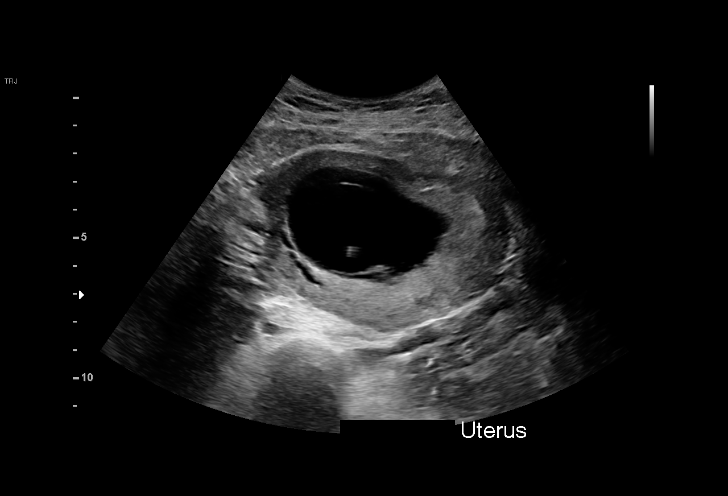
[im 16/30]
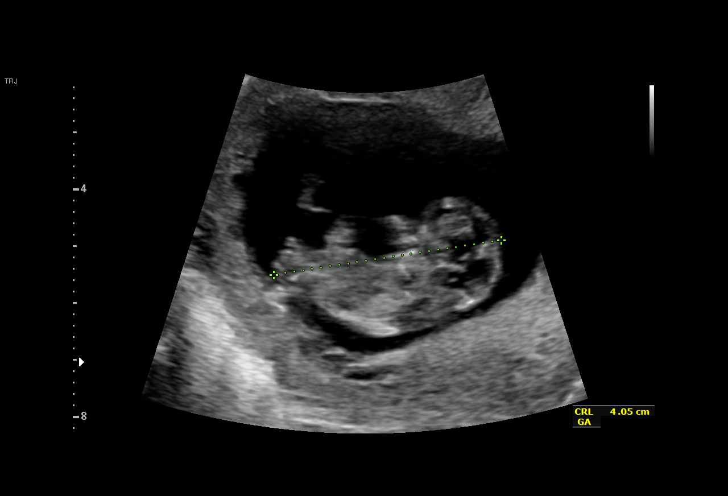
[im 17/30]
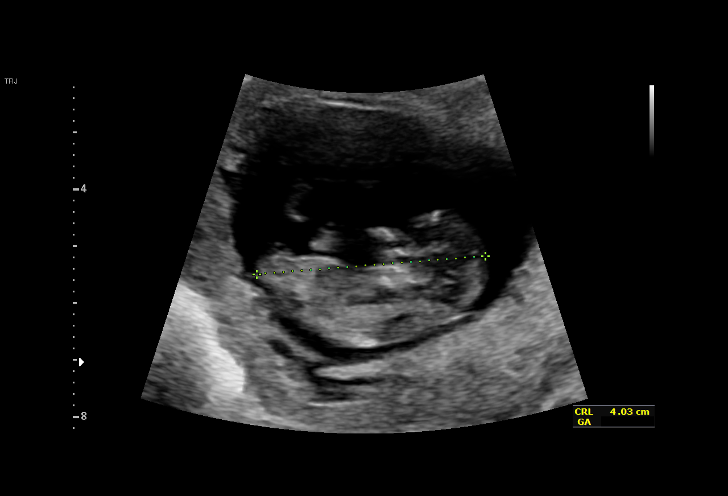
[im 19/30]
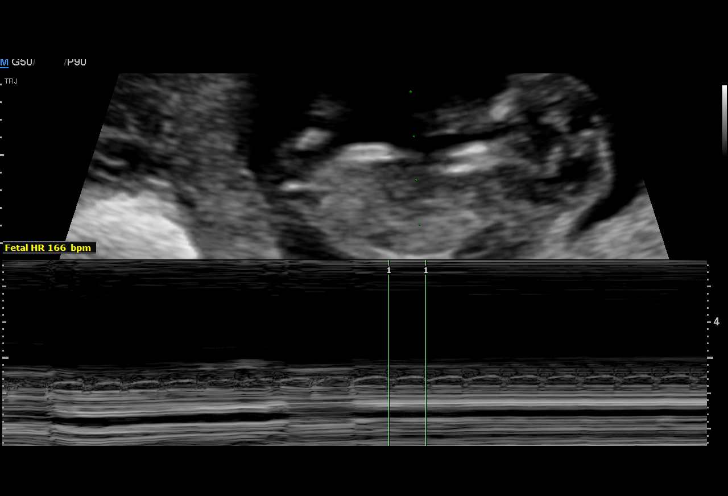
[im 21/30]
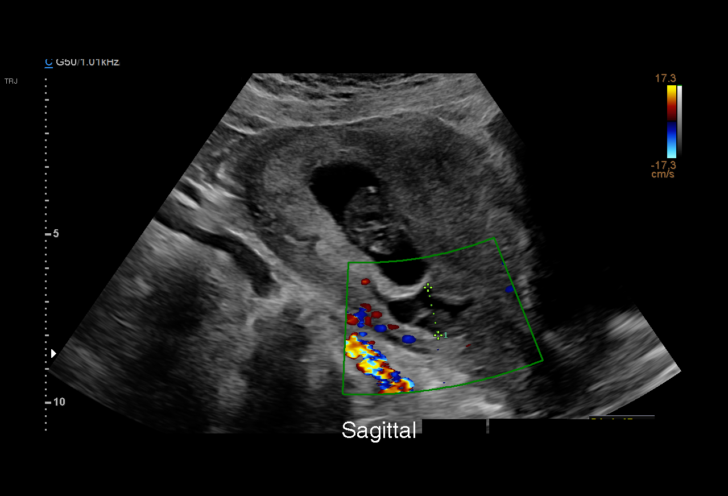
[im 23/30]
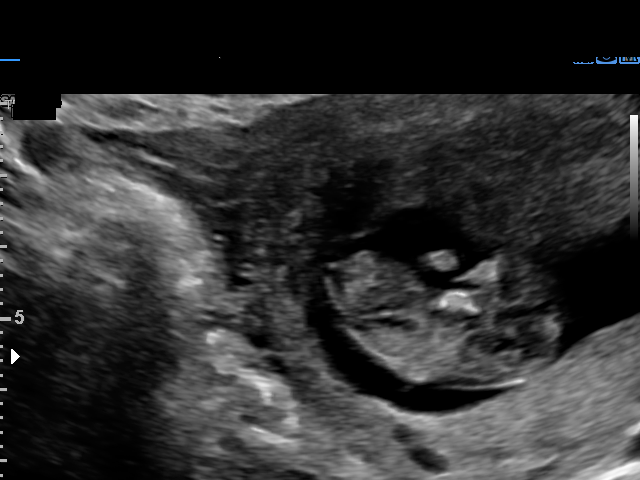
[im 25/30]
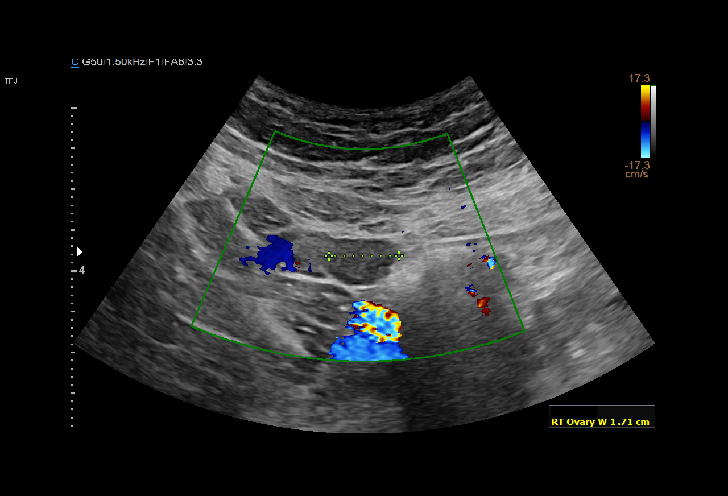
[im 27/30]
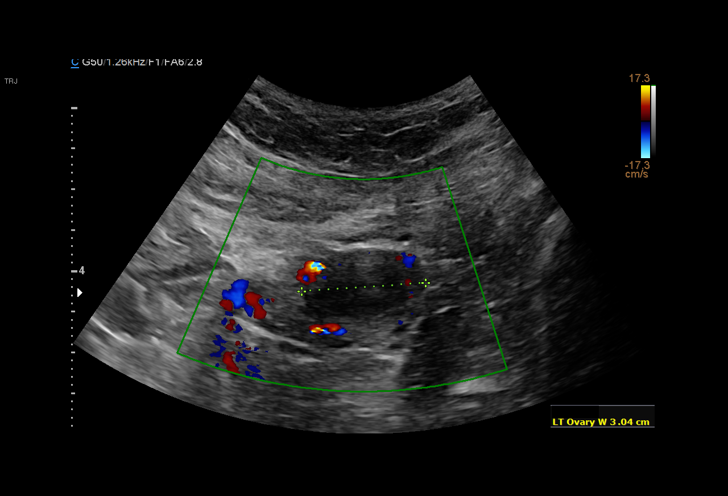
[im 30/30]
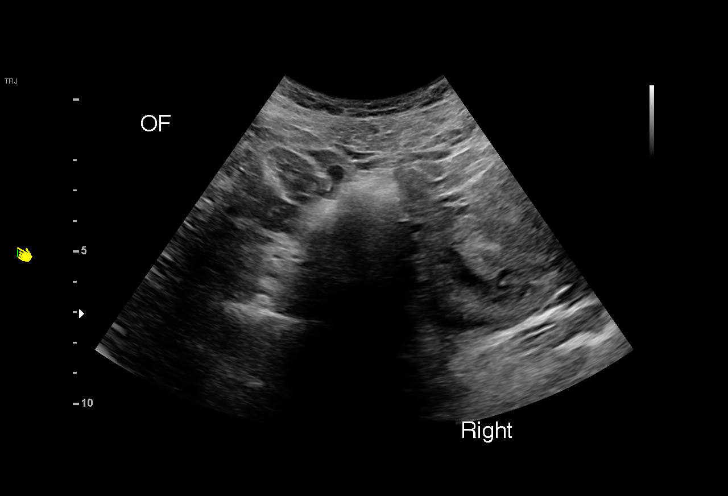

[15 of 28 positions shown; findings below may reference images not displayed]

FINDINGS: Intrauterine gestational sac: Present, single

Yolk sac:  Not visualized

Embryo:  Present, single

Cardiac Activity: Present

Heart Rate: 166 bpm

CRL:   40.4 mm   10 w 6 d                  US EDC: 01/14/2018

Subchorionic hemorrhage:  Small subchronic hemorrhage

Maternal uterus/adnexae: Remainder of uterus unremarkable

RIGHT ovary normal size and morphology 2.7 x 1.6 x 1.7 cm.

LEFT ovary measures 3.9 x 2.3 x 3.0 cm and contains a small corpus
luteal cyst.

Patient was tender in RIGHT adnexa though no focal sonographic
abnormality was visualized.

No free pelvic fluid or adnexal masses.
IMPRESSION: Single live intrauterine gestation as above.

Small subchronic hemorrhage.

## 2018-11-02 ENCOUNTER — Emergency Department (HOSPITAL_COMMUNITY)
Admission: EM | Admit: 2018-11-02 | Discharge: 2018-11-02 | Disposition: A | Discharge status: Home / Self Care | Payer: 59 | Attending: Emergency Medicine | Admitting: Emergency Medicine

## 2018-11-02 ENCOUNTER — Other Ambulatory Visit: Payer: Self-pay

## 2018-11-02 ENCOUNTER — Encounter (HOSPITAL_COMMUNITY): Payer: Self-pay

## 2018-11-02 DIAGNOSIS — T782XXA Anaphylactic shock, unspecified, initial encounter: Secondary | ICD-10-CM

## 2018-11-02 DIAGNOSIS — Z880 Allergy status to penicillin: Secondary | ICD-10-CM | POA: Insufficient documentation

## 2018-11-02 DIAGNOSIS — Z888 Allergy status to other drugs, medicaments and biological substances status: Secondary | ICD-10-CM | POA: Insufficient documentation

## 2018-11-02 DIAGNOSIS — Z91030 Bee allergy status: Secondary | ICD-10-CM | POA: Insufficient documentation

## 2018-11-02 MED ORDER — FAMOTIDINE IN NACL 20-0.9 MG/50ML-% IV SOLN
20.0000 mg | Freq: Once | INTRAVENOUS | Status: AC
  Administered 2018-11-02: 20 mg via INTRAVENOUS
  Filled 2018-11-02: qty 50

## 2018-11-02 MED ORDER — AEROCHAMBER PLUS FLO-VU MEDIUM MISC
1.0000 | Freq: Once | Status: AC
  Administered 2018-11-02: 1
  Filled 2018-11-02: qty 1

## 2018-11-02 MED ORDER — ALBUTEROL SULFATE HFA 108 (90 BASE) MCG/ACT IN AERS
2.0000 | INHALATION_SPRAY | Freq: Once | RESPIRATORY_TRACT | Status: AC
  Administered 2018-11-02: 2 via RESPIRATORY_TRACT
  Filled 2018-11-02: qty 6.7

## 2018-11-02 MED ORDER — EPINEPHRINE 0.3 MG/0.3ML IJ SOAJ: 0.3000 mg | INTRAMUSCULAR | 1 refills | Status: DC | PRN

## 2018-11-02 NOTE — Discharge Instructions (Signed)
It is ok to take oral pepcid if you have an allergic reaction.

## 2018-11-02 NOTE — ED Triage Notes (Signed)
Per ems: Pt coming from home c/o allergic rxn at 6:08 pm. Pt was stunk by a bee on her neck. Pt c/o tingly feeling. Lungs clear and vitals stable. No hives or shortness of breath. Pt allergic to benadryl  0.3 epi IM x2 500 mL bolus 8 mL zofran  125 solumedrol   20 L wrist

## 2018-11-02 NOTE — ED Provider Notes (Signed)
[facility] DEPT Provider Note   CSN: [id number] Arrival date & time: 11/02/18  1951    History   Chief Complaint Chief Complaint  Patient presents with  . Allergic Reaction    HPI Deborah Gibbs is a 28 y.o. female.     Pt presents to the ED today with an allergic reaction from a bee.  She has a hx of multiple allergies, and has had severe reactions to bee stings in the past.  The pt was stung by a bee and was out of her epi pen.  She was driving here and felt tingly and dizzy.  She called EMS who gave her epi, zofran, solumedrol, and IVFs.  She is feeling much better now, but still feels a little tight in her chest.     Past Medical History:  Diagnosis Date  . Abortion   . Allergy   . Fibroid     Patient Active Problem List   Diagnosis Date Noted  . Normal labor 01/09/2018  . Supervision of other normal pregnancy, antepartum 08/29/2016    Past Surgical History:  Procedure Laterality Date  . WISDOM TOOTH EXTRACTION       OB History    Gravida  3   Para  2   Term  2   Preterm      AB  1   Living  2     SAB      TAB  1   Ectopic      Multiple  0   Live Births  2            Home Medications    Prior to Admission medications   Medication Sig Start Date End Date Taking? Authorizing Provider  EPINEPHrine (EPIPEN 2-PAK) 0.3 mg/0.3 mL IJ SOAJ injection Inject 0.3 mLs (0.3 mg total) into the muscle as needed for anaphylaxis. 11/02/18   Isla Pence, MD    Family History Family History  Problem Relation Age of Onset  . Hypertension Father   . Cancer Maternal Grandmother        Breast Cancer  . Cancer Brother        neuroblastoma    Social History Social History   Tobacco Use  . Smoking status: Never Smoker  . Smokeless tobacco: Never Used  Substance Use Topics  . Alcohol use: No  . Drug use: Not Currently    Types: Marijuana    Comment: 1-2 X/day     Allergies   Amoxicillin, Diphenhydramine  hcl, Meloxicam, and Penicillins   Review of Systems Review of Systems  Respiratory: Positive for shortness of breath.   Skin: Positive for rash.  All other systems reviewed and are negative.    Physical Exam Updated Vital Signs BP (!) 95/56   Pulse 75   Temp 97.7 F (36.5 C) (Oral)   Resp 16   Ht 5\' 3"  (1.6 m)   Wt 81.6 kg   SpO2 98%   BMI 31.89 kg/m   Physical Exam Vitals signs and nursing note reviewed.  Constitutional:      Appearance: Normal appearance.  HENT:     Head: Normocephalic and atraumatic.     Right Ear: External ear normal.     Left Ear: External ear normal.     Nose: Nose normal.     Mouth/Throat:     Mouth: Mucous membranes are dry.  Eyes:     Extraocular Movements: Extraocular movements intact.     Conjunctiva/sclera: Conjunctivae  normal.     Pupils: Pupils are equal, round, and reactive to light.  Neck:     Musculoskeletal: Normal range of motion and neck supple.  Cardiovascular:     Rate and Rhythm: Normal rate and regular rhythm.     Pulses: Normal pulses.     Heart sounds: Normal heart sounds.  Pulmonary:     Effort: Pulmonary effort is normal.     Breath sounds: Normal breath sounds.  Abdominal:     General: Abdomen is flat. Bowel sounds are normal.     Palpations: Abdomen is soft.  Musculoskeletal: Normal range of motion.  Skin:    General: Skin is warm.     Capillary Refill: Capillary refill takes less than 2 seconds.  Neurological:     General: No focal deficit present.     Mental Status: She is alert and oriented to person, place, and time.  Psychiatric:        Mood and Affect: Mood normal.        Behavior: Behavior normal.        Thought Content: Thought content normal.        Judgment: Judgment normal.      ED Treatments / Results  Labs (all labs ordered are listed, but only abnormal results are displayed) Labs Reviewed - No data to display  EKG None  Radiology No results found.  Procedures Procedures  (including critical care time)  Medications Ordered in ED Medications  famotidine (PEPCID) IVPB 20 mg premix (0 mg Intravenous Stopped 11/02/18 2131)  albuterol (VENTOLIN HFA) 108 (90 Base) MCG/ACT inhaler 2 puff (2 puffs Inhalation Given 11/02/18 2101)  AeroChamber Plus Flo-Vu Medium MISC 1 each (1 each Other Given 11/02/18 2101)     Initial Impression / Assessment and Plan / ED Course  I have reviewed the triage vital signs and the nursing notes.  Pertinent labs & imaging results that were available during my care of the patient were reviewed by me and considered in my medical decision making (see chart for details).     Pt has allergic reactions to benadryl, claritin, and zyrtec.  She was given pepcid here and observed for 2 hrs.  She is feeling much better.  She is stable for d/c.  She will be given a rx for epi pen.  Return if worse.  F/u with pcp.  Final Clinical Impressions(s) / ED Diagnoses   Final diagnoses:  Anaphylaxis, initial encounter    ED Discharge Orders         Ordered    EPINEPHrine (EPIPEN 2-PAK) 0.3 mg/0.3 mL IJ SOAJ injection  As needed     11/02/18 2212           Isla Pence, MD 11/02/18 2214

## 2018-11-02 NOTE — ED Notes (Signed)
Pt ambulated to [facility]

## 2018-11-03 ENCOUNTER — Emergency Department (HOSPITAL_BASED_OUTPATIENT_CLINIC_OR_DEPARTMENT_OTHER)
Admission: EM | Admit: 2018-11-03 | Discharge: 2018-11-03 | Disposition: A | Discharge status: Home / Self Care | Payer: Self-pay | Attending: Emergency Medicine | Admitting: Emergency Medicine

## 2018-11-03 ENCOUNTER — Encounter (HOSPITAL_BASED_OUTPATIENT_CLINIC_OR_DEPARTMENT_OTHER): Payer: Self-pay | Admitting: *Deleted

## 2018-11-03 ENCOUNTER — Other Ambulatory Visit: Payer: Self-pay

## 2018-11-03 DIAGNOSIS — R55 Syncope and collapse: Secondary | ICD-10-CM | POA: Insufficient documentation

## 2018-11-03 DIAGNOSIS — Z5321 Procedure and treatment not carried out due to patient leaving prior to being seen by health care provider: Secondary | ICD-10-CM | POA: Insufficient documentation

## 2018-11-03 NOTE — ED Notes (Signed)
Pt is standing outside the ED talking with her father.

## 2018-11-03 NOTE — ED Triage Notes (Signed)
To ED via EMS c.o allergic reaction to bee sting yesterday. She was seen after the bee sting at [facility] yesterday. No hives noted. She had chest pain today and tingling in her hands and feet. She went to pick up her epi pen today due to chest pain. She went ot [facility] but left due to wait time. after leaving [facility] EMS was called due to passing out per pts father. EMS told pt she was hyperventilating on their arrival. On arrival to [facility] she is starting to itch but no rash or hives are seen.

## 2018-11-03 NOTE — ED Notes (Signed)
Per pt she is feeling better ,  Stated day care called she has to pick up son  Stated she would return if she started to feel bad  Out left without being seen after triage

## 2018-11-13 ENCOUNTER — Ambulatory Visit: Payer: Self-pay | Admitting: Family Medicine

## 2018-11-13 DIAGNOSIS — Z0289 Encounter for other administrative examinations: Secondary | ICD-10-CM

## 2019-03-20 ENCOUNTER — Ambulatory Visit: Payer: Self-pay | Admitting: Obstetrics & Gynecology

## 2019-04-07 DIAGNOSIS — F332 Major depressive disorder, recurrent severe without psychotic features: Secondary | ICD-10-CM | POA: Insufficient documentation

## 2019-04-09 ENCOUNTER — Ambulatory Visit: Payer: Self-pay | Admitting: Obstetrics & Gynecology

## 2019-05-20 ENCOUNTER — Ambulatory Visit: Payer: Medicaid Other | Admitting: Family

## 2019-07-07 ENCOUNTER — Other Ambulatory Visit: Payer: Self-pay

## 2019-07-08 ENCOUNTER — Other Ambulatory Visit: Payer: Self-pay

## 2019-07-08 ENCOUNTER — Encounter: Payer: Self-pay | Admitting: Family

## 2019-07-08 ENCOUNTER — Ambulatory Visit (INDEPENDENT_AMBULATORY_CARE_PROVIDER_SITE_OTHER): Payer: 59 | Admitting: Family

## 2019-07-08 VITALS — BP 103/71 | HR 75 | Resp 16 | Ht 63.0 in | Wt 196.0 lb

## 2019-07-08 DIAGNOSIS — F32A Depression, unspecified: Secondary | ICD-10-CM

## 2019-07-08 DIAGNOSIS — R51 Headache with orthostatic component, not elsewhere classified: Secondary | ICD-10-CM

## 2019-07-08 DIAGNOSIS — F329 Major depressive disorder, single episode, unspecified: Secondary | ICD-10-CM | POA: Diagnosis not present

## 2019-07-08 DIAGNOSIS — Z91030 Bee allergy status: Secondary | ICD-10-CM

## 2019-07-08 DIAGNOSIS — R42 Dizziness and giddiness: Secondary | ICD-10-CM | POA: Diagnosis not present

## 2019-07-08 MED ORDER — SERTRALINE HCL 50 MG PO TABS: 50.0000 mg | ORAL_TABLET | Freq: Every day | ORAL | 1 refills | Status: DC

## 2019-07-08 MED ORDER — EPINEPHRINE 0.3 MG/0.3ML IJ SOAJ: 0.3000 mg | INTRAMUSCULAR | 1 refills | Status: DC | PRN

## 2019-07-08 NOTE — Patient Instructions (Addendum)
Please restart sertraline. Call [facility] to schedule an appointment for counseling. Welcome to [facility]!    Psychiatric Services:  Dr. Modena Morrow - [phone] [facility]) - [facility] [place]) - [phone] Dr. Chucky May [facility] [place]) [phone] [facility] [place]) [phone] [place] ([place]) - [phone] [facility]) - [place], [address] [place], [place]

## 2019-07-08 NOTE — Progress Notes (Signed)
Subjective:    Patient ID: Deborah Gibbs, female    DOB: May 16, 1990, 29 y.o.   MRN: [id number]  HPI  Patient is a 29 year old female who presents today to establish care.  Notes that she sometimes has dizziness, HA. Worries that it could be her blood pressure. She reports + family hx of hypertension. Reports symptoms will resolve on their own and then will return if one of her kids upsets her  BP Readings from Last 3 Encounters:  07/08/19 103/71  11/03/18 108/74  11/02/18 (!) 95/56   Bee allergy- Reports + bee allergy. Needs an updated epipen.   Depression- reports that she has had issues since high school. Had post partum depression following birth of her first son. Stopped antidepressants due to lack of insurance.  Younger son has heart decision.  December of last year she was admitted to [facility] due to suicidal thoughts.  Stayed in hospital for 72 hours.  She was discharged home on medications.  She was taking sertraline 50mg .  She reports that she is starting to "sleep too long and withdraw." She denies tearfulness. Denies SI/HI.  Boyfriend and Dad are supportive.  She was doing counseling with [facility].   Review of Systems Past Medical History:  Diagnosis Date  . Abortion   . Allergy   . Fibroid      Social History   Socioeconomic History  . Marital status: Single    Spouse name: Not on file  . Number of children: Not on file  . Years of education: Not on file  . Highest education level: Not on file  Occupational History  . Not on file  Tobacco Use  . Smoking status: Never Smoker  . Smokeless tobacco: Never Used  Substance and Sexual Activity  . Alcohol use: Yes    Comment: 1 glass every 2 weeks of wine  . Drug use: Not Currently    Types: Marijuana    Comment: 1-2 X/day  . Sexual activity: Yes    Partners: Male  Other Topics Concern  . 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 Difficulty of Paying Living Expenses:   Food Insecurity:   . Worried About Charity fundraiser in the Last Year:   . Arboriculturist in the Last Year:   Transportation Needs:   . Film/video editor (Medical):   Marland Kitchen Lack of Transportation (Non-Medical):   Physical Activity:   . Days of Exercise per Week:   . Minutes of Exercise per Session:   Stress:   . Feeling of Stress :   Social Connections:   . Frequency of Communication with Friends and Family:   . Frequency of Social Gatherings with Friends and Family:   . Attends Religious Services:   . Active Member of Clubs or Organizations:   . Attends Archivist Meetings:   Marland Kitchen Marital Status:   Intimate Partner Violence:   . Fear of Current or Ex-Partner:   . Emotionally Abused:   Marland Kitchen Physically Abused:   . Sexually Abused:     Past Surgical History:  Procedure Laterality Date  . WISDOM TOOTH EXTRACTION      Family History  Problem Relation Age  of Onset  . Hypertension Father   . Cancer Maternal Grandmother        Breast Cancer  . Cancer Brother        neuroblastoma    Allergies  Allergen Reactions  . Amoxicillin Shortness Of Breath and Itching  . Diphenhydramine Hcl Hives, Swelling and Other (See Comments)    Reaction:  Facial/hand swelling  . Meloxicam Hives, Swelling and Other (See Comments)    Reaction:  Facial/hand swelling  Pt has tolerated ibuprofen in the past without reaction  . Penicillins Hives and Other (See Comments)    Has patient had a PCN reaction causing immediate rash, facial/tongue/throat swelling, SOB or lightheadedness with hypotension: No Has patient had a PCN reaction causing severe rash involving mucus membranes or skin necrosis: No Has patient had a PCN reaction that required hospitalization No Has patient had a PCN reaction occurring within the last 10 years: Yes If all  of the above answers are "NO", then may proceed with Cephalosporin use.     Current Outpatient Medications on File Prior to Visit  Medication Sig Dispense Refill  . JUNEL FE 1/20 1-20 MG-MCG tablet Take 1 tablet by mouth daily.     No current facility-administered medications on file prior to visit.    BP 103/71 (BP Location: Left Arm, Patient Position: Sitting, Cuff Size: Large)   Pulse 75   Resp 16   Ht 5\' 3"  (1.6 m)   Wt 196 lb (88.9 kg)   LMP 06/06/2019 (Exact Date)   SpO2 98%   BMI 34.72 kg/m       Objective:   Physical Exam Constitutional:      Appearance: She is well-developed.  Neck:     Thyroid: No thyromegaly.  Cardiovascular:     Rate and Rhythm: Normal rate and regular rhythm.     Heart sounds: Normal heart sounds. No murmur.  Pulmonary:     Effort: Pulmonary effort is normal. No respiratory distress.     Breath sounds: Normal breath sounds. No wheezing.  Musculoskeletal:     Cervical back: Neck supple.  Skin:    General: Skin is warm and dry.  Neurological:     Mental Status: She is alert and oriented to person, place, and time.  Psychiatric:        Behavior: Behavior normal.        Thought Content: Thought content normal.        Judgment: Judgment normal.     Comments: Mildly flattened affect           Assessment & Plan:  Bee allergy- refilled epipen for prn use.  Depression- uncontrolled. Seems to be worsening since she stopped her medication 1 month ago. Will restart sertraline. Suggested that she establish with a counselor and a psychiatrist. I think that the symptoms that she has been describing with the dizziness etc. Are likely due to depression/anxiety symptoms and I expect they will improve with the re-initiation of sertraline. Plan follow up in 1 month.   40 minutes spent on today's visit. The majority of this time was spent on counseling and reviewing pt's medical history with her.

## 2019-07-13 ENCOUNTER — Other Ambulatory Visit: Payer: Self-pay

## 2019-07-14 ENCOUNTER — Encounter: Payer: Self-pay | Admitting: Family Medicine

## 2019-07-14 ENCOUNTER — Other Ambulatory Visit: Payer: Self-pay

## 2019-07-14 ENCOUNTER — Ambulatory Visit (INDEPENDENT_AMBULATORY_CARE_PROVIDER_SITE_OTHER): Payer: 59 | Admitting: Family Medicine

## 2019-07-14 VITALS — BP 106/70 | HR 78 | Temp 96.4°F | Ht 63.0 in | Wt 196.2 lb

## 2019-07-14 DIAGNOSIS — L509 Urticaria, unspecified: Secondary | ICD-10-CM | POA: Diagnosis not present

## 2019-07-14 MED ORDER — PREDNISONE 20 MG PO TABS: 40.0000 mg | ORAL_TABLET | Freq: Every day | ORAL | 0 refills | Status: AC

## 2019-07-14 NOTE — Patient Instructions (Signed)
If you do not hear anything about your referral in the next 1-2 weeks, call our office and ask for an update.  Take Pepcid (famotidine) 20 mg twice daily for 7 days. This is over the counter.  Let us know if you need anything.

## 2019-07-14 NOTE — Progress Notes (Signed)
Chief Complaint  Patient presents with  . Allergic Reaction    since yesterda    Deborah Gibbs is a 29 y.o. female here for an allergic reaction.  Duration: 1 d Any new medications, lotions, soaps, topicals or detergents? No ACEi/ARB/Estrogen? No Hx of allergic rxn? Yes She specifically denies shortness of breath, tongue or lip swelling, or swelling in the throat.  Past Medical History:  Diagnosis Date  . Abortion   . Allergy   . Fibroid     Family History  Problem Relation Age of Onset  . Hypertension Father   . Cancer Maternal Grandmother        Breast Cancer  . Cancer Brother        neuroblastoma    BP 106/70 (BP Location: Left Arm, Patient Position: Sitting, Cuff Size: Normal)   Pulse 78   Temp (!) 96.4 F (35.8 C) (Temporal)   Ht 5\' 3"  (1.6 m)   Wt 196 lb 4 oz (89 kg)   SpO2 96%   BMI 34.76 kg/m  General: Well appearing, appearing stated age, well-nourished, awake HEENT: Ears are patent, TM's negative, Nose patent without discharge, MMM, tongue without deviation or edema, uvula without edema, pharynx without erythema or petechiae; Neck without masses, edema or asymmetry Heart: RRR Lungs: CTAB, no rales or stridor, normal respiratory effort without accessory muscle use Skin: Pinkish and slightly raised patches on the chest and upper extremities bilaterally Psych: Age appropriate judgment and insight, normal affect and mood  Urticaria - Plan: predniSONE (DELTASONE) 20 MG tablet, Ambulatory referral to Allergy  Prednisone burst, referral to allergy, Pepcid.  We will hold off on H1 blocker given her history of reaction with them.  She does have an EpiPen from her regular PCP. Follow-up as needed.  Warning signs and symptoms verbalized. The patient voiced understanding and agreement to the plan.  [place], DO 07/14/19 12:18 PM

## 2019-08-11 ENCOUNTER — Encounter: Payer: 59 | Admitting: Family

## 2019-08-13 NOTE — Progress Notes (Deleted)
New Patient Note  RE: [name] MRN: [id number] DOB: Oct 07, 1990 Date of Office Visit: 08/14/2019  Referring provider: Shelda Gibbs* Primary care provider: Debbrah Alar, NP  Chief Complaint: No chief complaint on file.  History of Present Illness: I had the pleasure of seeing Deborah Gibbs for initial evaluation at the [facility] [place] on 08/13/2019. She is a 29 y.o. female, who is referred here by Deborah Alar, NP for the evaluation of urticaria.  Rash started about *** ago. Mainly occurs on her ***. Describes them as ***. Individual rashes lasts about ***. No ecchymosis upon resolution. Associated symptoms include: ***. Suspected triggers are ***. Denies any *** fevers, chills, changes in medications, foods, personal care products or recent infections. She has tried the following therapies: *** with *** benefit. Systemic steroids ***. Currently on ***.  Previous work up includes: ***. Previous history of rash/hives: ***. Patient is up to date with the following cancer screening tests: ***.  Assessment and Plan: Fedra is a 29 y.o. female with: No problem-specific Assessment & Plan notes found for this encounter.  No follow-ups on file.  No orders of the defined types were placed in this encounter.  Lab Orders  No laboratory test(s) ordered today    Other allergy screening: Asthma: {Blank single:19197::"yes","no"} Rhino conjunctivitis: {Blank single:19197::"yes","no"} Food allergy: {Blank single:19197::"yes","no"} Medication allergy: {Blank single:19197::"yes","no"} Hymenoptera allergy: {Blank single:19197::"yes","no"} Urticaria: {Blank single:19197::"yes","no"} Eczema:{Blank single:19197::"yes","no"} History of recurrent infections suggestive of immunodeficency: {Blank single:19197::"yes","no"}  Diagnostics: Spirometry:  Tracings reviewed. Her effort: {Blank single:19197::"Good reproducible efforts.","It was hard to get consistent  efforts and there is a question as to whether this reflects a maximal maneuver.","Poor effort, data can not be interpreted."} FVC: ***L FEV1: ***L, ***% predicted FEV1/FVC ratio: ***% Interpretation: {Blank single:19197::"Spirometry consistent with mild obstructive disease","Spirometry consistent with moderate obstructive disease","Spirometry consistent with severe obstructive disease","Spirometry consistent with possible restrictive disease","Spirometry consistent with mixed obstructive and restrictive disease","Spirometry uninterpretable due to technique","Spirometry consistent with normal pattern","No overt abnormalities noted given today's efforts"}.  Please see scanned spirometry results for details.  Skin Testing: {Blank single:19197::"Select foods","Environmental allergy panel","Environmental allergy panel and select foods","Food allergy panel","None","Deferred due to recent antihistamines use"}. Positive test to: ***. Negative test to: ***.  Results discussed with patient/family.   Past Medical History: Patient Active Problem List   Diagnosis Date Noted  . Normal labor 01/09/2018  . Supervision of other normal pregnancy, antepartum 08/29/2016   Past Medical History:  Diagnosis Date  . Abortion   . Allergy   . Fibroid    Past Surgical History: Past Surgical History:  Procedure Laterality Date  . WISDOM TOOTH EXTRACTION     Medication List:  Current Outpatient Medications  Medication Sig Dispense Refill  . EPINEPHrine (EPIPEN 2-PAK) 0.3 mg/0.3 mL IJ SOAJ injection Inject 0.3 mLs (0.3 mg total) into the muscle as needed for anaphylaxis. 1 each 1  . JUNEL FE 1/20 1-20 MG-MCG tablet Take 1 tablet by mouth daily.    . sertraline (ZOLOFT) 50 MG tablet Take 1 tablet (50 mg total) by mouth daily. 30 tablet 1   No current facility-administered medications for this visit.   Allergies: Allergies  Allergen Reactions  . Amoxicillin Shortness Of Breath and Itching  . Bee Venom   .  Diphenhydramine Hcl Hives, Swelling and Other (See Comments)    Reaction:  Facial/hand swelling  . Meloxicam Hives, Swelling and Other (See Comments)    Reaction:  Facial/hand swelling  Pt has tolerated ibuprofen in the past  without reaction  . Penicillins Hives and Other (See Comments)    Has patient had a PCN reaction causing immediate rash, facial/tongue/throat swelling, SOB or lightheadedness with hypotension: No Has patient had a PCN reaction causing severe rash involving mucus membranes or skin necrosis: No Has patient had a PCN reaction that required hospitalization No Has patient had a PCN reaction occurring within the last 10 years: Yes If all of the above answers are "NO", then may proceed with Cephalosporin use.    Social History: Social History   Socioeconomic History  . Marital status: Single    Spouse name: Not on file  . Number of children: Not on file  . Years of education: Not on file  . Highest education level: Not on file  Occupational History  . Not on file  Tobacco Use  . Smoking status: Never Smoker  . Smokeless tobacco: Never Used  Substance and Sexual Activity  . Alcohol use: Yes    Comment: 1 glass every 2 weeks of wine  . Drug use: Not Currently    Types: Marijuana    Comment: 1-2 X/day  . Sexual activity: Yes    Partners: Male  Other Topics Concern  . 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 Difficulty of Paying Living Expenses:   Food Insecurity:   . Worried About Charity fundraiser in the Last Year:   . Arboriculturist in the Last Year:   Transportation Needs:   . Film/video editor (Medical):   Marland Kitchen Lack of Transportation (Non-Medical):   Physical Activity:   . Days of Exercise per Week:   . Minutes of Exercise per Session:   Stress:     . Feeling of Stress :   Social Connections:   . Frequency of Communication with Friends and Family:   . Frequency of Social Gatherings with Friends and Family:   . Attends Religious Services:   . Active Member of Clubs or Organizations:   . Attends Archivist Meetings:   Marland Kitchen Marital Status:    Lives in a ***. Smoking: *** Occupation: ***  Environmental HistoryFreight forwarder in the house: Estate agent in the family room: {Blank single:19197::"yes","no"} Carpet in the bedroom: {Blank single:19197::"yes","no"} Heating: {Blank single:19197::"electric","gas"} Cooling: {Blank single:19197::"central","window"} Pet: {Blank single:19197::"yes ***","no"}  Family History: Family History  Problem Relation Age of Onset  . Hypertension Father   . Cancer Maternal Grandmother        Breast Cancer  . Cancer Brother        neuroblastoma   Problem                               Relation Asthma                                   *** Eczema                                *** Food allergy                          ***  Allergic rhino conjunctivitis     ***  Review of Systems  Constitutional: Negative for appetite change, chills, fever and unexpected weight change.  HENT: Negative for congestion and rhinorrhea.   Eyes: Negative for itching.  Respiratory: Negative for cough, chest tightness, shortness of breath and wheezing.   Cardiovascular: Negative for chest pain.  Gastrointestinal: Negative for abdominal pain.  Genitourinary: Negative for difficulty urinating.  Skin: Negative for rash.  Neurological: Negative for headaches.   Objective: There were no vitals taken for this visit. There is no height or weight on file to calculate BMI. Physical Exam  Constitutional: She is oriented to person, place, and time. She appears well-developed and well-nourished.  HENT:  Head: Normocephalic and atraumatic.  Right Ear: External ear normal.  Left Ear:  External ear normal.  Nose: Nose normal.  Mouth/Throat: Oropharynx is clear and moist.  Eyes: Conjunctivae and EOM are normal.  Cardiovascular: Normal rate, regular rhythm and normal heart sounds. Exam reveals no gallop and no friction rub.  No murmur heard. Pulmonary/Chest: Effort normal and breath sounds normal. She has no wheezes. She has no rales.  Abdominal: Soft.  Musculoskeletal:     Cervical back: Neck supple.  Neurological: She is alert and oriented to person, place, and time.  Skin: Skin is warm. No rash noted.  Psychiatric: She has a normal mood and affect. Her behavior is normal.  Nursing note and vitals reviewed.  The plan was reviewed with the patient/family, and all questions/concerned were addressed.  It was my pleasure to see Vinia today and participate in her care. Please feel free to contact me with any questions or concerns.  Sincerely,  Rexene Alberts, DO Allergy & Immunology  [facility] office: [phone] [facility] office: [place] office: [phone]

## 2019-08-14 ENCOUNTER — Encounter: Payer: 59 | Admitting: Family

## 2019-08-14 ENCOUNTER — Ambulatory Visit: Payer: Self-pay | Admitting: Allergy

## 2019-08-19 ENCOUNTER — Encounter: Payer: 59 | Admitting: Family

## 2019-09-02 ENCOUNTER — Other Ambulatory Visit: Payer: Self-pay

## 2019-09-02 ENCOUNTER — Telehealth: Payer: Self-pay | Admitting: Family

## 2019-09-02 MED ORDER — SERTRALINE HCL 50 MG PO TABS: 50.0000 mg | ORAL_TABLET | Freq: Every day | ORAL | 1 refills | Status: DC

## 2019-09-02 NOTE — Telephone Encounter (Signed)
Caller: Deborah Gibbs Call Back phone number: [phone]  Patient is calling to ask if you could give her a refill of Zolof 50mg . She has an appointment scheduled with a psychiatrist on 06/214/21.  Medication: sertraline (ZOLOFT) 50 MG tablet   Pharmacy: [id number] - [place], [place] - [address] AT [address]OSS [address]  [name], [name] [address]  Phone:  [phone] Fax:  [phone]

## 2019-09-29 NOTE — Progress Notes (Deleted)
New Patient Note  RE: [name] MRN: [id number] DOB: October 23, 1990 Date of Office Visit: 09/30/2019  Referring provider: Shelda Pal* Primary care provider: Debbrah Alar, NP  Chief Complaint: No chief complaint on file.  History of Present Illness: I had the pleasure of seeing Deborah Gibbs for initial evaluation at the [facility] [place] [facility] [place] on 09/29/2019. She is a 29 y.o. female, who is referred here by Debbrah Alar, NP for the evaluation of urticaria.  Rash started about *** ago. Mainly occurs on her ***. Describes them as ***. Individual rashes lasts about ***. No ecchymosis upon resolution. Associated symptoms include: ***. Suspected triggers are ***. Denies any *** fevers, chills, changes in medications, foods, personal care products or recent infections. She has tried the following therapies: *** with *** benefit. Systemic steroids ***. Currently on ***.  Previous work up includes: ***. Previous history of rash/hives: ***. Patient is up to date with the following cancer screening tests: ***.  Assessment and Plan: Deborah Gibbs is a 29 y.o. female with: No problem-specific Assessment & Plan notes found for this encounter.  No follow-ups on file.  No orders of the defined types were placed in this encounter.  Lab Orders  No laboratory test(s) ordered today    Other allergy screening: Asthma: {Blank single:19197::"yes","no"} Rhino conjunctivitis: {Blank single:19197::"yes","no"} Food allergy: {Blank single:19197::"yes","no"} Medication allergy: {Blank single:19197::"yes","no"} Hymenoptera allergy: {Blank single:19197::"yes","no"} Urticaria: {Blank single:19197::"yes","no"} Eczema:{Blank single:19197::"yes","no"} History of recurrent infections suggestive of immunodeficency: {Blank single:19197::"yes","no"}  Diagnostics: Spirometry:  Tracings reviewed. Her effort: {Blank single:19197::"Good reproducible efforts.","It was hard to get consistent  efforts and there is a question as to whether this reflects a maximal maneuver.","Poor effort, data can not be interpreted."} FVC: ***L FEV1: ***L, ***% predicted FEV1/FVC ratio: ***% Interpretation: {Blank single:19197::"Spirometry consistent with mild obstructive disease","Spirometry consistent with moderate obstructive disease","Spirometry consistent with severe obstructive disease","Spirometry consistent with possible restrictive disease","Spirometry consistent with mixed obstructive and restrictive disease","Spirometry uninterpretable due to technique","Spirometry consistent with normal pattern","No overt abnormalities noted given today's efforts"}.  Please see scanned spirometry results for details.  Skin Testing: {Blank single:19197::"Select foods","Environmental allergy panel","Environmental allergy panel and select foods","Food allergy panel","None","Deferred due to recent antihistamines use"}. Positive test to: ***. Negative test to: ***.  Results discussed with patient/family.   Past Medical History: Patient Active Problem List   Diagnosis Date Noted  . Normal labor 01/09/2018  . Supervision of other normal pregnancy, antepartum 08/29/2016   Past Medical History:  Diagnosis Date  . Abortion   . Allergy   . Fibroid    Past Surgical History: Past Surgical History:  Procedure Laterality Date  . WISDOM TOOTH EXTRACTION     Medication List:  Current Outpatient Medications  Medication Sig Dispense Refill  . EPINEPHrine (EPIPEN 2-PAK) 0.3 mg/0.3 mL IJ SOAJ injection Inject 0.3 mLs (0.3 mg total) into the muscle as needed for anaphylaxis. 1 each 1  . JUNEL FE 1/20 1-20 MG-MCG tablet Take 1 tablet by mouth daily.    . sertraline (ZOLOFT) 50 MG tablet Take 1 tablet (50 mg total) by mouth daily. 30 tablet 1   No current facility-administered medications for this visit.   Allergies: Allergies  Allergen Reactions  . Amoxicillin Shortness Of Breath and Itching  . Bee Venom   .  Diphenhydramine Hcl Hives, Swelling and Other (See Comments)    Reaction:  Facial/hand swelling  . Meloxicam Hives, Swelling and Other (See Comments)    Reaction:  Facial/hand swelling  Pt has tolerated ibuprofen in the past  without reaction  . Penicillins Hives and Other (See Comments)    Has patient had a PCN reaction causing immediate rash, facial/tongue/throat swelling, SOB or lightheadedness with hypotension: No Has patient had a PCN reaction causing severe rash involving mucus membranes or skin necrosis: No Has patient had a PCN reaction that required hospitalization No Has patient had a PCN reaction occurring within the last 10 years: Yes If all of the above answers are "NO", then may proceed with Cephalosporin use.    Social History: Social History   Socioeconomic History  . Marital status: Single    Spouse name: Not on file  . Number of children: Not on file  . Years of education: Not on file  . Highest education level: Not on file  Occupational History  . Not on file  Tobacco Use  . Smoking status: Never Smoker  . Smokeless tobacco: Never Used  Substance and Sexual Activity  . Alcohol use: Yes    Comment: 1 glass every 2 weeks of wine  . Drug use: Not Currently    Types: Marijuana    Comment: 1-2 X/day  . Sexual activity: Yes    Partners: Male  Other Topics Concern  . 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 Difficulty of Paying Living Expenses:   Food Insecurity:   . Worried About Charity fundraiser in the Last Year:   . Arboriculturist in the Last Year:   Transportation Needs:   . Film/video editor (Medical):   Marland Kitchen Lack of Transportation (Non-Medical):   Physical Activity:   . Days of Exercise per Week:   . Minutes of Exercise per Session:   Stress:     . Feeling of Stress :   Social Connections:   . Frequency of Communication with Friends and Family:   . Frequency of Social Gatherings with Friends and Family:   . Attends Religious Services:   . Active Member of Clubs or Organizations:   . Attends Archivist Meetings:   Marland Kitchen Marital Status:    Lives in a ***. Smoking: *** Occupation: ***  Environmental HistoryFreight forwarder in the house: Estate agent in the family room: {Blank single:19197::"yes","no"} Carpet in the bedroom: {Blank single:19197::"yes","no"} Heating: {Blank single:19197::"electric","gas"} Cooling: {Blank single:19197::"central","window"} Pet: {Blank single:19197::"yes ***","no"}  Family History: Family History  Problem Relation Age of Onset  . Hypertension Father   . Cancer Maternal Grandmother        Breast Cancer  . Cancer Brother        neuroblastoma   Problem                               Relation Asthma                                   *** Eczema                                *** Food allergy                          ***  Allergic rhino conjunctivitis     ***  Review of Systems  Constitutional: Negative for appetite change, chills, fever and unexpected weight change.  HENT: Negative for congestion and rhinorrhea.   Eyes: Negative for itching.  Respiratory: Negative for cough, chest tightness, shortness of breath and wheezing.   Cardiovascular: Negative for chest pain.  Gastrointestinal: Negative for abdominal pain.  Genitourinary: Negative for difficulty urinating.  Skin: Negative for rash.  Neurological: Negative for headaches.   Objective: There were no vitals taken for this visit. There is no height or weight on file to calculate BMI. Physical Exam Vitals and nursing note reviewed.  Constitutional:      Appearance: Normal appearance. She is well-developed.  HENT:     Head: Normocephalic and atraumatic.     Right Ear: External ear normal.      Left Ear: External ear normal.     Nose: Nose normal.     Mouth/Throat:     Mouth: Mucous membranes are moist.     Pharynx: Oropharynx is clear.  Eyes:     Conjunctiva/sclera: Conjunctivae normal.  Cardiovascular:     Rate and Rhythm: Normal rate and regular rhythm.     Heart sounds: Normal heart sounds. No murmur heard.  No friction rub. No gallop.   Pulmonary:     Effort: Pulmonary effort is normal.     Breath sounds: Normal breath sounds. No wheezing, rhonchi or rales.  Abdominal:     Palpations: Abdomen is soft.  Musculoskeletal:     Cervical back: Neck supple.  Skin:    General: Skin is warm.     Findings: No rash.  Neurological:     Mental Status: She is alert and oriented to person, place, and time.  Psychiatric:        Behavior: Behavior normal.    The plan was reviewed with the patient/family, and all questions/concerned were addressed.  It was my pleasure to see Deborah Gibbs today and participate in her care. Please feel free to contact me with any questions or concerns.  Sincerely,  Rexene Alberts, DO Allergy & Immunology  [facility] office: [phone] [facility] office: [place] office: [phone]

## 2019-09-30 ENCOUNTER — Ambulatory Visit: Payer: Medicaid Other | Admitting: Allergy

## 2020-03-10 ENCOUNTER — Other Ambulatory Visit: Payer: Self-pay

## 2020-03-10 ENCOUNTER — Encounter (HOSPITAL_BASED_OUTPATIENT_CLINIC_OR_DEPARTMENT_OTHER): Payer: Self-pay

## 2020-03-10 ENCOUNTER — Emergency Department (HOSPITAL_BASED_OUTPATIENT_CLINIC_OR_DEPARTMENT_OTHER)
Admission: EM | Admit: 2020-03-10 | Discharge: 2020-03-10 | Disposition: A | Discharge status: Home / Self Care | Payer: BC Managed Care – PPO | Attending: Emergency Medicine | Admitting: Emergency Medicine

## 2020-03-10 DIAGNOSIS — F411 Generalized anxiety disorder: Secondary | ICD-10-CM | POA: Insufficient documentation

## 2020-03-10 DIAGNOSIS — T782XXA Anaphylactic shock, unspecified, initial encounter: Secondary | ICD-10-CM | POA: Diagnosis not present

## 2020-03-10 LAB — CBC WITH DIFFERENTIAL/PLATELET
Abs Immature Granulocytes: 0.18 10*3/uL — ABNORMAL HIGH (ref 0.00–0.07)
Basophils Absolute: 0.1 10*3/uL (ref 0.0–0.1)
Basophils Relative: 0 %
Eosinophils Absolute: 0.2 10*3/uL (ref 0.0–0.5)
Eosinophils Relative: 1 %
HCT: 41.1 % (ref 36.0–46.0)
Hemoglobin: 14 g/dL (ref 12.0–15.0)
Immature Granulocytes: 1 %
Lymphocytes Relative: 29 %
Lymphs Abs: 4.9 10*3/uL — ABNORMAL HIGH (ref 0.7–4.0)
MCH: 28.7 pg (ref 26.0–34.0)
MCHC: 34.1 g/dL (ref 30.0–36.0)
MCV: 84.2 fL (ref 80.0–100.0)
Monocytes Absolute: 1.1 10*3/uL — ABNORMAL HIGH (ref 0.1–1.0)
Monocytes Relative: 6 %
Neutro Abs: 10.6 10*3/uL — ABNORMAL HIGH (ref 1.7–7.7)
Neutrophils Relative %: 63 %
Platelets: 337 10*3/uL (ref 150–400)
RBC: 4.88 MIL/uL (ref 3.87–5.11)
RDW: 12.8 % (ref 11.5–15.5)
WBC: 17 10*3/uL — ABNORMAL HIGH (ref 4.0–10.5)
nRBC: 0 % (ref 0.0–0.2)

## 2020-03-10 LAB — BASIC METABOLIC PANEL
Anion gap: 9 (ref 5–15)
BUN: 6 mg/dL (ref 6–20)
CO2: 23 mmol/L (ref 22–32)
Calcium: 9.1 mg/dL (ref 8.9–10.3)
Chloride: 103 mmol/L (ref 98–111)
Creatinine, Ser: 0.52 mg/dL (ref 0.44–1.00)
GFR, Estimated: >60 mL/min (ref >60)
Glucose, Bld: 89 mg/dL (ref 70–99)
Potassium: 3.6 mmol/L (ref 3.5–5.1)
Sodium: 135 mmol/L (ref 135–145)

## 2020-03-10 LAB — PREGNANCY, URINE: Preg Test, Ur: NEGATIVE

## 2020-03-10 MED ORDER — ONDANSETRON HCL 4 MG/2ML IJ SOLN
4.0000 mg | Freq: Once | INTRAMUSCULAR | Status: AC
  Administered 2020-03-10: 4 mg via INTRAVENOUS

## 2020-03-10 MED ORDER — EPINEPHRINE 0.3 MG/0.3ML IJ SOAJ
INTRAMUSCULAR | Status: AC
  Administered 2020-03-10: 0.3 mg via INTRAMUSCULAR
  Filled 2020-03-10: qty 0.3

## 2020-03-10 MED ORDER — EPINEPHRINE 0.3 MG/0.3ML IJ SOAJ: 0.3000 mg | INTRAMUSCULAR | 1 refills | Status: DC | PRN

## 2020-03-10 MED ORDER — FAMOTIDINE IN NACL 20-0.9 MG/50ML-% IV SOLN
INTRAVENOUS | Status: AC
  Filled 2020-03-10: qty 50

## 2020-03-10 MED ORDER — METHYLPREDNISOLONE SODIUM SUCC 125 MG IJ SOLR
INTRAMUSCULAR | Status: AC
  Administered 2020-03-10: 125 mg
  Filled 2020-03-10: qty 2

## 2020-03-10 MED ORDER — ONDANSETRON HCL 4 MG/2ML IJ SOLN
INTRAMUSCULAR | Status: AC
  Filled 2020-03-10: qty 2

## 2020-03-10 NOTE — ED Provider Notes (Signed)
[facility] EMERGENCY DEPARTMENT Provider Note   CSN: [id number] Arrival date & time: 03/10/20  1649     History Chief Complaint  Patient presents with  . Allergic Reaction    Deborah Gibbs is a 29 y.o. female presenting to the emergency department with symptoms of anaphylaxis.  She reports she ate a prepackaged serving of pineapple from the gas station and began feeling tingling in her mouth, itching in her throat, and neck pain.  She is also having some nausea upon arrival.  She self-administered an EpiPen through her pants on her right leg, however states the medication was expired in August 2021.  She has had anaphylaxis in the past 2 Benadryl and Pepcid, as well as other medications.  She has not had allergy to pineapple in the past.  Denies other symptoms including no abdominal pain, lightheadedness, chest pain.  Shortness of breath improved upon arrival to the ED.  The history is provided by the patient.       Past Medical History:  Diagnosis Date  . Abortion   . Allergy   . Fibroid     Patient Active Problem List   Diagnosis Date Noted  . Normal labor 01/09/2018  . Supervision of other normal pregnancy, antepartum 08/29/2016    Past Surgical History:  Procedure Laterality Date  . WISDOM TOOTH EXTRACTION       OB History    Gravida  3   Para  2   Term  2   Preterm      AB  1   Living  2     SAB      TAB  1   Ectopic      Multiple  0   Live Births  2           Family History  Problem Relation Age of Onset  . Hypertension Father   . Cancer Maternal Grandmother        Breast Cancer  . Cancer Brother        neuroblastoma    Social History   Tobacco Use  . Smoking status: Never Smoker  . Smokeless tobacco: Never Used  Vaping Use  . Vaping Use: Never used  Substance Use Topics  . Alcohol use: Yes    Comment: weekly  . Drug use: Yes    Types: Marijuana    Home Medications Prior to Admission medications   Medication  Sig Start Date End Date Taking? Authorizing Provider  buPROPion (WELLBUTRIN) 75 MG tablet Take by mouth. 02/11/20  Yes [provider]  EPINEPHrine (EPIPEN 2-PAK) 0.3 mg/0.3 mL IJ SOAJ injection Inject 0.3 mg into the muscle as needed for anaphylaxis. 03/10/20   Mckinsley Koelzer, [place] N, PA-C  JUNEL FE 1/20 1-20 MG-MCG tablet Take 1 tablet by mouth daily. 06/22/19   [provider]  sertraline (ZOLOFT) 50 MG tablet Take 1 tablet (50 mg total) by mouth daily. 09/02/19   Debbrah Alar, NP    Allergies    Amoxicillin, Bee venom, Diphenhydramine hcl, Meloxicam, Penicillins, and Pepcid [famotidine]  Review of Systems   Review of Systems  All other systems reviewed and are negative.   Physical Exam Updated Vital Signs BP 102/66 (BP Location: Right Arm)   Pulse 81   Temp 99.3 F (37.4 C) (Oral)   Resp 15   Ht 5\' 3"  (1.6 m)   Wt 90.7 kg   SpO2 100%   BMI 35.43 kg/m   Physical Exam Vitals and nursing  note reviewed.  Constitutional:      Appearance: She is well-developed.     Comments: Patient appears to be frequently clearing her throat.  Speaking full sentences.  Tolerating secretions.  HENT:     Head: Normocephalic and atraumatic.     Mouth/Throat:     Comments: No swelling to the lips, tongue, throat. Eyes:     Conjunctiva/sclera: Conjunctivae normal.  Cardiovascular:     Rate and Rhythm: Normal rate and regular rhythm.  Pulmonary:     Effort: Pulmonary effort is normal. No respiratory distress.     Breath sounds: Normal breath sounds. No stridor.  Abdominal:     General: Bowel sounds are normal.     Palpations: Abdomen is soft.     Tenderness: There is no abdominal tenderness.  Skin:    General: Skin is warm.     Comments: No apparent injection site to the right thigh from EpiPen.  Neurological:     Mental Status: She is alert.  Psychiatric:        Behavior: Behavior normal.     ED Results / Procedures / Treatments   Labs (all labs ordered are  listed, but only abnormal results are displayed) Labs Reviewed  CBC WITH DIFFERENTIAL/PLATELET - Abnormal; Notable for the following components:      Result Value   WBC 17.0 (*)    Neutro Abs 10.6 (*)    Lymphs Abs 4.9 (*)    Monocytes Absolute 1.1 (*)    Abs Immature Granulocytes 0.18 (*)    All other components within normal limits  BASIC METABOLIC PANEL  PREGNANCY, URINE    EKG None  Radiology No results found.  Procedures Procedures (including critical care time)  Medications Ordered in ED Medications  methylPREDNISolone sodium succinate (SOLU-MEDROL) 125 mg/2 mL injection (125 mg  Given 03/10/20 1709)  EPINEPHrine (EPI-PEN) 0.3 mg/0.3 mL injection (0.3 mg Intramuscular Given 03/10/20 1718)  ondansetron (ZOFRAN) injection 4 mg (4 mg Intravenous Given 03/10/20 1710)    ED Course  I have reviewed the triage vital signs and the nursing notes.  Pertinent labs & imaging results that were available during my care of the patient were reviewed by me and considered in my medical decision making (see chart for details).    MDM Rules/Calculators/A&P                          Patient presents with symptoms of anaphylaxis after eating pineapple prior to arrival.  She was short of breath on arrival though is improved upon evaluation.  She is having itching in her throat and neck pain, as well as nausea.  She has no swelling to her lips, tongue, mouth or throat.  Personally visualized the EpiPen she says she self administered, however the needle is bent and she has no mark on her thigh therefore do not believe patient has received full dose of epinephrine as she has had no symptom relief.  EpiPen redosed in the ED, she states she is sure now she did not administer correctly as she had minimal pain when she administered.  She is also given dose of IV Solu-Medrol.  Consulted with ED pharmacist, no further medication management in terms of antihistamines are available considering patient's  allergies.  Will redose epinephrine as needed.  Patient with improvement in symptoms soon after epinephrine administration.  We will continue to monitor.  Care assumed at shift change by PA Joldersma. Patient will need monitoring and  re-evaluation at 2300. If symptoms continue to remain resolved, patient is appropriate for discharge with refill of epipen.  Final Clinical Impression(s) / ED Diagnoses Final diagnoses:  Anaphylaxis, initial encounter    Rx / DC Orders ED Discharge Orders         Ordered    EPINEPHrine (EPIPEN 2-PAK) 0.3 mg/0.3 mL IJ SOAJ injection  As needed        03/10/20 1945           Kisean Rollo, [place] N, PA-C 03/10/20 1945    Breck Coons, MD 03/11/20 0001

## 2020-03-10 NOTE — ED Provider Notes (Signed)
Patient is a 29 year old female whose care was transferred to me by Candice Camp.  Her HPI is below:  MARCILE FUQUAY is a 29 y.o. female presenting to the emergency department with symptoms of anaphylaxis.  She reports she ate a prepackaged serving of pineapple from the gas station and began feeling tingling in her mouth, itching in her throat, and neck pain.  She is also having some nausea upon arrival.  She self-administered an EpiPen through her pants on her right leg, however states the medication was expired in August 2021.  She has had anaphylaxis in the past 2 Benadryl and Pepcid, as well as other medications.  She has not had allergy to pineapple in the past.  Denies other symptoms including no abdominal pain, lightheadedness, chest pain.  Shortness of breath improved upon arrival to the ED. Physical Exam  BP 102/66 (BP Location: Right Arm)   Pulse 81   Temp 99.3 F (37.4 C) (Oral)   Resp 15   Ht 5\' 3"  (1.6 m)   Wt 90.7 kg   SpO2 100%   BMI 35.43 kg/m   Physical Exam Vitals and nursing note reviewed.  Constitutional:      Appearance: She is well-developed.     Comments: Patient appears to be frequently clearing her throat.  Speaking full sentences.  Tolerating secretions.  HENT:     Head: Normocephalic and atraumatic.     Mouth/Throat:     Comments: No swelling to the lips, tongue, throat. Eyes:     Conjunctiva/sclera: Conjunctivae normal.  Cardiovascular:     Rate and Rhythm: Normal rate and regular rhythm.  Pulmonary:     Effort: Pulmonary effort is normal. No respiratory distress.     Breath sounds: Normal breath sounds. No stridor.  Abdominal:     General: Bowel sounds are normal.     Palpations: Abdomen is soft.     Tenderness: There is no abdominal tenderness.  Skin:    General: Skin is warm.     Comments: No apparent injection site to the right thigh from EpiPen.  Neurological:     Mental Status: She is alert.  Psychiatric:        Behavior: Behavior normal.  ED  Course/Procedures     Procedures  MDM  Patient is a 29 year old female who presents to the emergency department due to what appears to be an allergic reaction after eating pineapple.  Patient's care was transferred to me at shift change from [facility].  Patient was having symptoms consistent with anaphylaxis and had attempted to administer an EpiPen at home but did so unsuccessfully.  She received an EpiPen here in the ED as well as 125 of Solu-Medrol and Zofran.  Patient has been monitored since this episode for multiple hours the emergency department.  She has had no recurrent symptoms.  I reassessed the patient and she states she feels great and is eager to be discharged.  She is having no wheezing.  No tachycardia.  Speaking in clear and complete sentences.  Readily handling secretions.  No angioedema.  Patient feels comfortable being discharged at this time.  Feel the patient is stable for discharge at this time.  Given her new prescription for an EpiPen.  She has an appointment with her PCP next Monday.  Recommended that she discuss her symptoms with her PCP and consider follow-up with an allergist.  Patient understands to return to the ER with any new or worsening symptoms.  She verbalized understanding of  the above plan.  Her questions were answered and she was amicable at the time of discharge.     Rayna Sexton, PA-C 03/10/20 2237    Breck Coons, MD 03/11/20 0001

## 2020-03-10 NOTE — ED Notes (Signed)
Patient denies pain and is resting comfortably.  

## 2020-03-10 NOTE — Discharge Instructions (Signed)
Please read instructions below. Schedule an appointment with your PCP to follow up on your visit today. Use your new epipend and return to the ER immediately for feeling your throat closing, swelling of your lips or tongue, difficulty breathing, or new or concerning symptoms.

## 2020-03-10 NOTE — ED Notes (Signed)
Pt on monitor and vitals cycling 

## 2020-03-10 NOTE — Progress Notes (Signed)
Pt seen at bedside in ED RM14 for allergic reaction, epi pen use x1. Upon arrival to pt room, pt tachypneic and clearing throat. No stridor heard and clear/diminished bilateral breath sounds. RT will continue to monitor and be available as needed.

## 2020-03-10 NOTE — ED Triage Notes (Addendum)
Pt c/o itching, mouth tingling, SOB-started ~5 min after eating pineapple ~415pm-states she used expired epi pen PTA with no improvement-pt with constant clearing throat in triage

## 2020-03-10 NOTE — ED Notes (Signed)
Pt states she is feeling better. States is no longer nauseous and no longer has urge to clear her throat.

## 2020-03-10 NOTE — Progress Notes (Signed)
When assessing pt for stridor, pt complains of more tenderness to neck on the right vs the left. MD notified

## 2020-04-21 ENCOUNTER — Ambulatory Visit (INDEPENDENT_AMBULATORY_CARE_PROVIDER_SITE_OTHER): Payer: BC Managed Care – PPO | Admitting: Family Medicine

## 2020-04-21 ENCOUNTER — Other Ambulatory Visit: Payer: Self-pay

## 2020-04-21 ENCOUNTER — Encounter (INDEPENDENT_AMBULATORY_CARE_PROVIDER_SITE_OTHER): Payer: Self-pay | Admitting: Family Medicine

## 2020-04-21 VITALS — BP 105/70 | HR 69 | Temp 97.9°F | Ht 63.0 in | Wt 196.0 lb

## 2020-04-21 DIAGNOSIS — Z6834 Body mass index (BMI) 34.0-34.9, adult: Secondary | ICD-10-CM

## 2020-04-21 DIAGNOSIS — Z9189 Other specified personal risk factors, not elsewhere classified: Secondary | ICD-10-CM | POA: Insufficient documentation

## 2020-04-21 DIAGNOSIS — R5383 Other fatigue: Secondary | ICD-10-CM | POA: Insufficient documentation

## 2020-04-21 DIAGNOSIS — G4700 Insomnia, unspecified: Secondary | ICD-10-CM | POA: Diagnosis not present

## 2020-04-21 DIAGNOSIS — F329 Major depressive disorder, single episode, unspecified: Secondary | ICD-10-CM | POA: Diagnosis not present

## 2020-04-21 DIAGNOSIS — Z0289 Encounter for other administrative examinations: Secondary | ICD-10-CM

## 2020-04-21 DIAGNOSIS — R0602 Shortness of breath: Secondary | ICD-10-CM | POA: Insufficient documentation

## 2020-04-21 DIAGNOSIS — E559 Vitamin D deficiency, unspecified: Secondary | ICD-10-CM

## 2020-04-21 DIAGNOSIS — E669 Obesity, unspecified: Secondary | ICD-10-CM

## 2020-04-22 LAB — COMPREHENSIVE METABOLIC PANEL
ALT: 11 IU/L (ref 0–32)
AST: 15 IU/L (ref 0–40)
Albumin/Globulin Ratio: 2.2 (ref 1.2–2.2)
Albumin: 5 g/dL (ref 3.9–5.0)
Alkaline Phosphatase: 48 IU/L (ref 44–121)
BUN/Creatinine Ratio: 10 (ref 9–23)
BUN: 6 mg/dL (ref 6–20)
Bilirubin Total: 0.5 mg/dL (ref 0.0–1.2)
CO2: 22 mmol/L (ref 20–29)
Calcium: 9.7 mg/dL (ref 8.7–10.2)
Chloride: 101 mmol/L (ref 96–106)
Creatinine, Ser: 0.61 mg/dL (ref 0.57–1.00)
GFR calc Af Amer: 144 mL/min/{1.73_m2} (ref >59)
GFR calc non Af Amer: 125 mL/min/{1.73_m2} (ref >59)
Globulin, Total: 2.3 g/dL (ref 1.5–4.5)
Glucose: 83 mg/dL (ref 65–99)
Potassium: 4.3 mmol/L (ref 3.5–5.2)
Sodium: 139 mmol/L (ref 134–144)
Total Protein: 7.3 g/dL (ref 6.0–8.5)

## 2020-04-22 LAB — VITAMIN B12: Vitamin B-12: 321 pg/mL (ref 232–1245)

## 2020-04-22 LAB — CBC WITH DIFFERENTIAL/PLATELET
Basophils Absolute: 0 10*3/uL (ref 0.0–0.2)
Basos: 0 %
EOS (ABSOLUTE): 0.1 10*3/uL (ref 0.0–0.4)
Eos: 1 %
Hematocrit: 42.4 % (ref 34.0–46.6)
Hemoglobin: 14.1 g/dL (ref 11.1–15.9)
Immature Grans (Abs): 0 10*3/uL (ref 0.0–0.1)
Immature Granulocytes: 0 %
Lymphocytes Absolute: 3.5 10*3/uL — ABNORMAL HIGH (ref 0.7–3.1)
Lymphs: 32 %
MCH: 28.8 pg (ref 26.6–33.0)
MCHC: 33.3 g/dL (ref 31.5–35.7)
MCV: 87 fL (ref 79–97)
Monocytes Absolute: 0.7 10*3/uL (ref 0.1–0.9)
Monocytes: 6 %
Neutrophils Absolute: 6.7 10*3/uL (ref 1.4–7.0)
Neutrophils: 61 %
Platelets: 282 10*3/uL (ref 150–450)
RBC: 4.9 x10E6/uL (ref 3.77–5.28)
RDW: 12.9 % (ref 11.7–15.4)
WBC: 11.1 10*3/uL — ABNORMAL HIGH (ref 3.4–10.8)

## 2020-04-22 LAB — INSULIN, RANDOM: INSULIN: 19.3 u[IU]/mL (ref 2.6–24.9)

## 2020-04-22 LAB — VITAMIN D 25 HYDROXY (VIT D DEFICIENCY, FRACTURES): Vit D, 25-Hydroxy: 11.9 ng/mL — ABNORMAL LOW (ref 30.0–100.0)

## 2020-04-22 LAB — T3: T3, Total: 118 ng/dL (ref 71–180)

## 2020-04-22 LAB — T4, FREE: Free T4: 1.19 ng/dL (ref 0.82–1.77)

## 2020-04-22 LAB — LIPID PANEL
Chol/HDL Ratio: 3.7 ratio (ref 0.0–4.4)
Cholesterol, Total: 142 mg/dL (ref 100–199)
HDL: 38 mg/dL — ABNORMAL LOW (ref >39)
LDL Chol Calc (NIH): 86 mg/dL (ref 0–99)
Triglycerides: 94 mg/dL (ref 0–149)
VLDL Cholesterol Cal: 18 mg/dL (ref 5–40)

## 2020-04-22 LAB — TSH: TSH: 1.02 u[IU]/mL (ref 0.450–4.500)

## 2020-04-22 LAB — HEMOGLOBIN A1C
Est. average glucose Bld gHb Est-mCnc: 108 mg/dL
Hgb A1c MFr Bld: 5.4 % (ref 4.8–5.6)

## 2020-04-22 LAB — FOLATE: Folate: 13.9 ng/mL (ref >3.0)

## 2020-04-26 NOTE — Progress Notes (Signed)
Chief Complaint:   OBESITY Deborah Gibbs (MR# [id number]) is a 30 y.o. female who presents for evaluation and treatment of obesity and related comorbidities. Current BMI is Body mass index is 34.72 kg/m. Deborah Gibbs has been struggling with her weight for many years and has been unsuccessful in either losing weight, maintaining weight loss, or reaching her healthy weight goal.  Deborah Gibbs is currently in the action stage of change and ready to dedicate time achieving and maintaining a healthier weight. Deborah Gibbs is interested in becoming our patient and working on intensive lifestyle modifications including (but not limited to) diet and exercise for weight loss.  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 She eats takeout/fast food 7 times or more a week. She wants to lose 60 lbs in 6 months.  Deborah Gibbs's habits were reviewed today and are as follows: Her family eats meals together, her desired weight loss is 56 lbs, she has been heavy most of her life, she started gaining weight after 2019, her heaviest weight ever was 202 lbs pounds, she is a picky eater and doesn't like to eat healthier foods, she has significant food cravings issues, she snacks frequently in the evenings, she wakes up frequently in the middle of the night to eat, she skips meals frequently, she is frequently drinking liquids with calories, she frequently makes poor food choices, she frequently eats larger portions than normal, she has binge eating behaviors and she struggles with emotional eating.  This is the patient's first visit at [facility].  The patient's NEW PATIENT PACKET that they filled out prior to today's office visit was reviewed at length and information from that paperwork was included within the following office visit note.    Included in the packet: current and past health history, medications, allergies, ROS, gynecologic  history (women only), surgical history, family history, social history, weight history, weight loss surgery history (for those that have had weight loss surgery), nutritional evaluation, mood and food questionnaire along with a depression screening (PHQ9) on all patients, an Epworth questionnaire, sleep habits questionnaire, patient life and health improvement goals questionnaire. These will all be scanned into the patient's chart under media.   During the visit, I independently reviewed the patient's EKG, bioimpedance scale results, and indirect calorimeter results. I used this information to tailor a meal plan for the patient that will help Deborah Gibbs to lose weight and will improve her obesity-related conditions going forward.  I performed a medically necessary appropriate examination and/or evaluation. I discussed the assessment and treatment plan with the patient. The patient was provided an opportunity to ask questions and all were answered. The patient agreed with the plan and demonstrated an understanding of the instructions. Labs were ordered today (unless patient declined them) and will be reviewed with the patient at our next visit unless more critical results need to be addressed immediately. Clinical information was updated and documented in the EMR.  Time spent on visit including pre-visit chart review and post-visit care was estimated to be 65 minutes.  A separate 15 minutes was spent on risk counseling (see above/below).   Depression Screen Deborah Gibbs's Food and Mood (modified PHQ-9) score was 17.  Depression screen Deborah Gibbs 2/9 04/21/2020  Decreased Interest 3  Down, Depressed, Hopeless 3  PHQ - 2 Score 6  Altered sleeping 0  Tired, decreased energy 3  Change in appetite 3  Feeling bad  or failure about yourself  3  Trouble concentrating 2  Moving slowly or fidgety/restless 0  Suicidal thoughts 0  PHQ-9 Score 17  Difficult doing work/chores Somewhat difficult    Assessment/Plan:   1. Other  fatigue Deborah Gibbs admits to daytime somnolence and admits to waking up still tired. Patent has a history of symptoms of morning headache. Deborah Gibbs generally gets 6 hours of sleep per night, and states that she has generally restful sleep. Snoring is not present. Apneic episodes are not present. Epworth Sleepiness Score is 3.  Plan: Deborah Gibbs does feel that her weight is causing her energy to be lower than it should be. Fatigue may be related to obesity, depression or many other causes. Labs will be ordered, and in the meanwhile, Deborah Gibbs will focus on self care including making healthy food choices, increasing physical activity and focusing on stress reduction. Check labs today.  Lab/Orders today or future: - Vitamin B12 - CBC with Differential/Platelet - Comprehensive metabolic panel - Folate - Hemoglobin A1c - Insulin, random - Lipid panel - T3 - T4, free - TSH - VITAMIN D 25 Hydroxy (Vit-D Deficiency, Fractures)  2. SOBOE (shortness of breath on exertion) Deborah Gibbs notes increasing shortness of breath with exercising and seems to be worsening over time with weight gain. She notes getting out of breath sooner with activity than she used to. This has gotten worse recently. Deborah Gibbs denies shortness of breath at rest or orthopnea.  Plan: Deborah Gibbs does feel that she gets out of breath more easily that she used to when she exercises. Deborah Gibbs's shortness of breath appears to be obesity related and exercise induced. She has agreed to work on weight loss and gradually increase exercise to treat her exercise induced shortness of breath. Will continue to monitor closely. Check labs today.  Lab/Orders today or future: - Vitamin B12 - CBC with Differential/Platelet - Comprehensive metabolic panel - Folate - Hemoglobin A1c - Insulin, random - Lipid panel - T3 - T4, free - TSH - VITAMIN D 25 Hydroxy (Vit-D Deficiency, Fractures)  3. Major depressive disorder, remission status unspecified, unspecified whether  recurrent Deborah Gibbs has anxiety and stress eating. She os treated by PCP at [facility]. Patient is without acute symptoms or concerns today. She is prescribed Wellbutrin and has a PHQ9 score of 17. Deborah Gibbs denies suicidal or homicidal ideations.  Plan: Recommend CBT to patient for emotional support to improve mental health and keep on track with health goals. Emotional eating strategies discussed with patient.  Lab/Orders today or future: - Vitamin B12 - CBC with Differential/Platelet - Comprehensive metabolic panel - Folate - Hemoglobin A1c - Insulin, random - Lipid panel - T3 - T4, free - TSH - VITAMIN D 25 Hydroxy (Vit-D Deficiency, Fractures)  4. Insomnia, unspecified type Deborah Gibbs is without issues or concerns today. She is prescribed Trazodone at bedtime.   Plan: Sleep hygiene discussed and patient is encouraged to be regular with sleep/wake times.  Lab/Orders today or future: - Vitamin B12 - CBC with Differential/Platelet - Comprehensive metabolic panel - Folate - Hemoglobin A1c - Insulin, random - Lipid panel - T3 - T4, free - TSH - VITAMIN D 25 Hydroxy (Vit-D Deficiency, Fractures)  5. Vitamin D deficiency Deborah Gibbs is currently taking OTC vitamin D each day. She denies nausea, vomiting or muscle weakness.  Plan: Low Vitamin D level contributes to fatigue and are associated with obesity, breast, and colon cancer. She agrees to follow-up for routine testing of Vitamin D, at least 2-3 times  per year to avoid over-replacement. Check labs today.  Lab/Orders today or future: - Vitamin B12 - Comprehensive metabolic panel - VITAMIN D 25 Hydroxy (Vit-D Deficiency, Fractures)  6. At risk for impaired metabolic function Due to Deborah Gibbs's current state of health and medical condition(s), they are at a significantly higher risk for impaired metabolic function. This further also puts the patient at much greater risk to also subsequently develop cardiopulmonary  conditions that can negatively affect patient's quality of life as well.  At least 28 minutes was spent on counseling Alitza about these concerns today and I stressed the importance of reversing these risks factors. Initial goal is to lose at least 5-10% of starting weight to help reduce risk factors. Counseling: Intensive lifestyle modifications discussed with Asmi as most appropriate first line treatment. She will continue to work on diet, exercise and weight loss efforts.  We will continue to reassess these conditions on a fairly regular basis in an attempt to decrease patient's overall morbidity and mortality  7. Class 1 obesity with serious comorbidity and body mass index (BMI) of 34.0 to 34.9 in adult, unspecified obesity type Deborah Gibbs is currently in the action stage of change and her goal is to continue with weight loss efforts. I recommend Kinzley begin the structured treatment plan as follows:  She has agreed to the Category 2 Plan.  Exercise goals: As is   Behavioral modification strategies: meal planning and cooking strategies, keeping healthy foods in the home and planning for success.  She was informed of the importance of frequent follow-up visits to maximize her success with intensive lifestyle modifications for her multiple health conditions. She was informed we would discuss her lab results at her next visit unless there is a critical issue that needs to be addressed sooner. Senaida agreed to keep her next visit at the agreed upon time to discuss these results.  Objective:   Blood pressure 105/70, pulse 69, temperature 97.9 F (36.6 C), height 5\' 3"  (1.6 m), weight 196 lb (88.9 kg), SpO2 99 %, unknown if currently breastfeeding. Body mass index is 34.72 kg/m.  EKG: Normal sinus rhythm, rate 90.  Indirect Calorimeter completed today shows a VO2 of 263 and a REE of 1828.  Her calculated basal metabolic rate is 5573 thus her basal metabolic rate is better than expected.  General:  Cooperative, alert, well developed, in no acute distress. HEENT: Conjunctivae and lids unremarkable. Cardiovascular: Regular rhythm.  Lungs: Normal work of breathing. Neurologic: No focal deficits.   Attestation Statements:   Reviewed by clinician on day of visit: allergies, medications, problem list, medical history, surgical history, family history, social history, and previous encounter notes.  Coral Ceo, am acting as Location manager for [employer], DO.  I have reviewed the above documentation for accuracy and completeness, and I agree with the above. Marjory Sneddon, D.O.  The [place] was signed into law in 2016 which includes the topic of electronic health records.  This provides immediate access to information in MyChart.  This includes consultation notes, operative notes, office notes, lab results and pathology reports.  If you have any questions about what you read please let us know at your next visit so we can discuss your concerns and take corrective action if need be.  We are right here with you.

## 2020-05-05 ENCOUNTER — Encounter (INDEPENDENT_AMBULATORY_CARE_PROVIDER_SITE_OTHER): Payer: Self-pay

## 2020-05-05 ENCOUNTER — Ambulatory Visit (INDEPENDENT_AMBULATORY_CARE_PROVIDER_SITE_OTHER): Payer: Medicaid Other | Admitting: Family Medicine

## 2021-11-15 ENCOUNTER — Encounter (INDEPENDENT_AMBULATORY_CARE_PROVIDER_SITE_OTHER): Payer: Self-pay

## 2022-05-25 ENCOUNTER — Emergency Department (HOSPITAL_COMMUNITY): Payer: Medicaid Other

## 2022-05-25 ENCOUNTER — Other Ambulatory Visit: Payer: Self-pay

## 2022-05-25 ENCOUNTER — Emergency Department (HOSPITAL_COMMUNITY)
Admission: EM | Admit: 2022-05-25 | Discharge: 2022-05-25 | Disposition: A | Discharge status: Home / Self Care | Payer: Medicaid Other | Attending: Emergency Medicine | Admitting: Emergency Medicine

## 2022-05-25 ENCOUNTER — Encounter (HOSPITAL_COMMUNITY): Payer: Self-pay

## 2022-05-25 DIAGNOSIS — R200 Anesthesia of skin: Secondary | ICD-10-CM | POA: Insufficient documentation

## 2022-05-25 DIAGNOSIS — D72829 Elevated white blood cell count, unspecified: Secondary | ICD-10-CM | POA: Insufficient documentation

## 2022-05-25 DIAGNOSIS — R339 Retention of urine, unspecified: Secondary | ICD-10-CM | POA: Insufficient documentation

## 2022-05-25 DIAGNOSIS — M5416 Radiculopathy, lumbar region: Secondary | ICD-10-CM

## 2022-05-25 LAB — CBC WITH DIFFERENTIAL/PLATELET
Abs Immature Granulocytes: 0.06 10*3/uL (ref 0.00–0.07)
Basophils Absolute: 0.1 10*3/uL (ref 0.0–0.1)
Basophils Relative: 1 %
Eosinophils Absolute: 0.3 10*3/uL (ref 0.0–0.5)
Eosinophils Relative: 2 %
HCT: 42.7 % (ref 36.0–46.0)
Hemoglobin: 14.7 g/dL (ref 12.0–15.0)
Immature Granulocytes: 0 %
Lymphocytes Relative: 25 %
Lymphs Abs: 3.5 10*3/uL (ref 0.7–4.0)
MCH: 30 pg (ref 26.0–34.0)
MCHC: 34.4 g/dL (ref 30.0–36.0)
MCV: 87.1 fL (ref 80.0–100.0)
Monocytes Absolute: 1 10*3/uL (ref 0.1–1.0)
Monocytes Relative: 7 %
Neutro Abs: 9.3 10*3/uL — ABNORMAL HIGH (ref 1.7–7.7)
Neutrophils Relative %: 65 %
Platelets: 316 10*3/uL (ref 150–400)
RBC: 4.9 MIL/uL (ref 3.87–5.11)
RDW: 12.7 % (ref 11.5–15.5)
WBC: 14.3 10*3/uL — ABNORMAL HIGH (ref 4.0–10.5)
nRBC: 0 % (ref 0.0–0.2)

## 2022-05-25 LAB — BASIC METABOLIC PANEL
Anion gap: 10 (ref 5–15)
BUN: <5 mg/dL — ABNORMAL LOW (ref 6–20)
CO2: 25 mmol/L (ref 22–32)
Calcium: 9.6 mg/dL (ref 8.9–10.3)
Chloride: 104 mmol/L (ref 98–111)
Creatinine, Ser: 0.7 mg/dL (ref 0.44–1.00)
GFR, Estimated: >60 mL/min (ref >60)
Glucose, Bld: 81 mg/dL (ref 70–99)
Potassium: 3.6 mmol/L (ref 3.5–5.1)
Sodium: 139 mmol/L (ref 135–145)

## 2022-05-25 MED ORDER — PREDNISONE 10 MG (21) PO TBPK: ORAL_TABLET | Freq: Every day | ORAL | 0 refills | Status: DC

## 2022-05-25 MED ORDER — PREDNISONE 20 MG PO TABS
60.0000 mg | ORAL_TABLET | Freq: Once | ORAL | Status: AC
  Administered 2022-05-25: 60 mg via ORAL
  Filled 2022-05-25: qty 3

## 2022-05-25 NOTE — Discharge Instructions (Signed)
We performed a lumbar MRI today.  The results I will have posted below.  You do not have any evidence of cauda equina syndrome, I talked to our neurosurgery colleagues and they will follow-up with you outpatient.  The number is listed, please call to schedule an appointment.  I am sending you home on a steroid Dosepak, please take as prescribed.  After finishing this course you may take Motrin and Tylenol for pain.  You may also start gentle stretching exercises, I recommend you follow-up with your PCP to discuss physical therapy as well.  Please return to the ED if you have any further difficulty ambulating, urinary retention, or numbness or tingling in your genital area.

## 2022-05-25 NOTE — ED Triage Notes (Signed)
Patient sent by MD for MRI of lower back.  Reports hx of herniated disc but the last 3 days she has been having back pain and numbness in her right leg.

## 2022-05-25 NOTE — ED Provider Notes (Signed)
[facility] Provider Note   CSN: [id number] Arrival date & time: 05/25/22  1401   History  Chief Complaint  Patient presents with   Numbness    Deborah Gibbs is a 32 y.o. female.  This is a 32 year old female with history of major depressive disorder and previous herniated disc presenting to the ED for back pain with leg numbness.  Patient states this morning she noticed her right leg was numb, she has had difficulty urinating starting today as well.  She tried to ambulate and felt like she was having worsening difficulty and came into the ED for evaluation.  She denies any injuries, states she has had a herniated disc a couple years ago but they did not do anything about it.  Denies any fevers, IV drug use, abdominal pain or chest pain.     Home Medications Prior to Admission medications   Medication Sig Start Date End Date Taking? Authorizing Provider  buPROPion (WELLBUTRIN XL) 150 MG 24 hr tablet Take 150 mg by mouth every morning. 04/18/20   [provider]  EPINEPHrine (EPIPEN 2-PAK) 0.3 mg/0.3 mL IJ SOAJ injection Inject 0.3 mg into the muscle as needed for anaphylaxis. 03/10/20   Robinson, [place] N, PA-C  Misc Natural Products (APPLE CIDER VINEGAR DIET) TABS Take by mouth.    [provider]  Multiple Vitamins-Minerals (ZINC PO) Take by mouth.    [provider]  Riboflavin (B2 PO) Take by mouth.    [provider]  Thiamine HCl (B-1 PO) Take by mouth.    [provider]  traZODone (DESYREL) 100 MG tablet Take 100 mg by mouth at bedtime. 04/18/20   [provider]  VITAMIN D PO Take by mouth.    [provider]      Allergies    Amoxicillin, Bee venom, Diphenhydramine hcl, Meloxicam, Penicillins, and Pepcid [famotidine]    Review of Systems   Review of Systems  Constitutional:  Negative for chills and fever.  Respiratory:  Negative for cough and shortness of breath.    Cardiovascular:  Negative for chest pain.  Gastrointestinal:  Negative for constipation and nausea.  Genitourinary:  Positive for difficulty urinating.  Neurological:  Positive for weakness and numbness.    Physical Exam Updated Vital Signs BP 127/85 (BP Location: Right Arm)   Pulse 83   Temp 98.7 F (37.1 C) (Oral)   Resp 16   Ht 5' 3"$  (1.6 m)   Wt 88.9 kg   SpO2 98%   BMI 34.72 kg/m  Physical Exam Vitals and nursing note reviewed.  Constitutional:      General: She is not in acute distress.    Appearance: She is well-developed.  HENT:     Head: Normocephalic and atraumatic.  Eyes:     Conjunctiva/sclera: Conjunctivae normal.  Cardiovascular:     Rate and Rhythm: Normal rate and regular rhythm.     Heart sounds: No murmur heard. Pulmonary:     Effort: Pulmonary effort is normal. No respiratory distress.     Breath sounds: Normal breath sounds.  Abdominal:     Palpations: Abdomen is soft.     Tenderness: There is no abdominal tenderness.  Musculoskeletal:        General: No swelling.     Cervical back: Neck supple.  Skin:    General: Skin is warm and dry.     Capillary Refill: Capillary refill takes less than 2 seconds.  Neurological:  Mental Status: She is alert and oriented to person, place, and time. Mental status is at baseline.     Sensory: Sensory deficit (Right lower extremity numbness sparing foot) present.     Motor: Weakness (Right lower extremity) present.  Psychiatric:        Mood and Affect: Mood normal.     ED Results / Procedures / Treatments   Labs (all labs ordered are listed, but only abnormal results are displayed) Labs Reviewed  CBC WITH DIFFERENTIAL/PLATELET  BASIC METABOLIC PANEL    EKG None  Radiology No results found.  Procedures Procedures    Medications Ordered in ED Medications  predniSONE (DELTASONE) tablet 60 mg (60 mg Oral Given 05/25/22 1432)    ED Course/ Medical Decision Making/ A&P                              Medical Decision Making Presents with known history of herniated disc, she has symptoms concerning for cauda equina syndrome including right leg numbness, urinary retention, and difficulty ambulating.  Will obtain an emergent MRI L-spine for further workup.  No upper motor neuron symptoms on exam, no clonus, patient has symmetrical strength preserved, lower suspicion for conus medullaris.  Differential diagnosis includes lumbar radiculopathy, neuropathy, myopathy and cauda equina syndrome, conus medullaris  I personally reviewed and interpreted patient's labs which showed a normal BMP, a chronic leukocytosis of 14.3 likely secondary to steroids.  I personally reviewed patient's MRI and agree with radiology, no evidence of cauda equina, no conus medullaris.  There is a moderate size disc protrusion at L3 and L4, persistent but slight interval retraction and desiccation of the large central and right paracentral disc extrusion at L5-S1.   Discussed this case with neurosurgery to have them look over the MRI to confirm no cauda equina and no acute concerns.  After further discussion they agree no cauda equina, they will follow-up with patient on an outpatient basis.  Recommend steroid taper.  I ordered a steroid taper for the patient, and gave her strict return precautions if she had any signs or symptoms of cauda equina including worsening urinary retention, inability to walk, saddle anesthesia to return to the ED for evaluation.  Patient was stable at discharge.  Amount and/or Complexity of Data Reviewed External Data Reviewed: notes.    Details: Per chart review patient had x-ray on 06/14/2020 which shows no compression deformity or listhesis of lumbar spine with questionable mild disc space narrowing at L5/S1.  Prior to this in 2018 she had an MRI of her lumbar spine which shows central disc extrusion at L5/S1 and a small central disc protrusion at L4-L5 without stenosis or impingement. Labs:  ordered. Decision-making details documented in ED Course. Radiology: ordered and independent interpretation performed. Decision-making details documented in ED Course.  Risk Prescription drug management.          Final Clinical Impression(s) / ED Diagnoses Final diagnoses:  None    Rx / DC Orders ED Discharge Orders     None         Jimmie Molly, MD 05/25/22 2035    Margette Fast, MD 05/30/22 [phone]

## 2022-05-25 NOTE — ED Notes (Signed)
Sort RN Note: Pt observed to be waiting in no apparent distress at this time.

## 2022-05-25 NOTE — ED Notes (Signed)
Bladder scan volume zero.

## 2022-05-25 NOTE — ED Provider Triage Note (Cosign Needed Addendum)
Emergency Medicine Provider Triage Evaluation Note  Deborah Gibbs , a 32 y.o. female  was evaluated in triage.  Pt complains of low back pain x 1 week, 2 days of difficulty urinating, 1 day of R leg numbness. Numbness started in R foot, now is right thigh. No hx of IV drug use. PCP sent her here for MRI. Hx of similar in 2018  Review of Systems  Positive: Back pain, difficulty urinating, numbness Negative: Dysuria, hematuria, incontinence, fevers  Physical Exam  BP 127/85 (BP Location: Right Arm)   Pulse 83   Temp 98.7 F (37.1 C) (Oral)   Resp 16   Ht 5' 3"$  (1.6 m)   Wt 88.9 kg   SpO2 98%   BMI 34.72 kg/m  Gen:   Awake, no distress   Resp:  Normal effort  MSK:   Moves extremities without difficulty  Other:  Reports decreased sensation of R thigh compared to left, 5/5 strength in bilateral lower extremities  Medical Decision Making  Medically screening exam initiated at 2:23 PM.  Appropriate orders placed.  Randolm Idol was informed that the remainder of the evaluation will be completed by another provider, this initial triage assessment does not replace that evaluation, and the importance of remaining in the ED until their evaluation is complete.  Workup initiated, initial dose of steroids given      Zackari Ruane T, PA-C 05/25/22 1429

## 2022-10-23 ENCOUNTER — Telehealth (HOSPITAL_BASED_OUTPATIENT_CLINIC_OR_DEPARTMENT_OTHER): Payer: Self-pay | Admitting: *Deleted

## 2022-11-19 ENCOUNTER — Other Ambulatory Visit: Payer: Self-pay | Admitting: Neurosurgery

## 2022-11-19 DIAGNOSIS — M5416 Radiculopathy, lumbar region: Secondary | ICD-10-CM

## 2023-03-17 ENCOUNTER — Emergency Department (HOSPITAL_COMMUNITY)
Admission: EM | Admit: 2023-03-17 | Discharge: 2023-03-17 | Disposition: A | Discharge status: Home / Self Care | Payer: 59 | Attending: Emergency Medicine | Admitting: Emergency Medicine

## 2023-03-17 ENCOUNTER — Other Ambulatory Visit: Payer: Self-pay

## 2023-03-17 ENCOUNTER — Encounter (HOSPITAL_COMMUNITY): Payer: Self-pay

## 2023-03-17 ENCOUNTER — Emergency Department (HOSPITAL_COMMUNITY): Payer: 59

## 2023-03-17 DIAGNOSIS — M5441 Lumbago with sciatica, right side: Secondary | ICD-10-CM | POA: Diagnosis not present

## 2023-03-17 DIAGNOSIS — M5440 Lumbago with sciatica, unspecified side: Secondary | ICD-10-CM

## 2023-03-17 DIAGNOSIS — M545 Low back pain, unspecified: Secondary | ICD-10-CM | POA: Diagnosis present

## 2023-03-17 LAB — CBC WITH DIFFERENTIAL/PLATELET
Abs Immature Granulocytes: 0.04 10*3/uL (ref 0.00–0.07)
Basophils Absolute: 0.1 10*3/uL (ref 0.0–0.1)
Basophils Relative: 1 %
Eosinophils Absolute: 0.1 10*3/uL (ref 0.0–0.5)
Eosinophils Relative: 1 %
HCT: 38.3 % (ref 36.0–46.0)
Hemoglobin: 13 g/dL (ref 12.0–15.0)
Immature Granulocytes: 0 %
Lymphocytes Relative: 36 %
Lymphs Abs: 4.5 10*3/uL — ABNORMAL HIGH (ref 0.7–4.0)
MCH: 29.2 pg (ref 26.0–34.0)
MCHC: 33.9 g/dL (ref 30.0–36.0)
MCV: 86.1 fL (ref 80.0–100.0)
Monocytes Absolute: 0.9 10*3/uL (ref 0.1–1.0)
Monocytes Relative: 8 %
Neutro Abs: 6.7 10*3/uL (ref 1.7–7.7)
Neutrophils Relative %: 54 %
Platelets: 290 10*3/uL (ref 150–400)
RBC: 4.45 MIL/uL (ref 3.87–5.11)
RDW: 12.2 % (ref 11.5–15.5)
WBC: 12.3 10*3/uL — ABNORMAL HIGH (ref 4.0–10.5)
nRBC: 0 % (ref 0.0–0.2)

## 2023-03-17 LAB — BASIC METABOLIC PANEL
Anion gap: 9 (ref 5–15)
BUN: <5 mg/dL — ABNORMAL LOW (ref 6–20)
CO2: 21 mmol/L — ABNORMAL LOW (ref 22–32)
Calcium: 9.1 mg/dL (ref 8.9–10.3)
Chloride: 109 mmol/L (ref 98–111)
Creatinine, Ser: 0.64 mg/dL (ref 0.44–1.00)
GFR, Estimated: >60 mL/min (ref >60)
Glucose, Bld: 93 mg/dL (ref 70–99)
Potassium: 3.6 mmol/L (ref 3.5–5.1)
Sodium: 139 mmol/L (ref 135–145)

## 2023-03-17 MED ORDER — DEXAMETHASONE SODIUM PHOSPHATE 10 MG/ML IJ SOLN
10.0000 mg | Freq: Once | INTRAMUSCULAR | Status: AC
  Administered 2023-03-17: 10 mg via INTRAVENOUS
  Filled 2023-03-17: qty 1

## 2023-03-17 MED ORDER — OXYCODONE HCL 5 MG PO TABS: 5.0000 mg | ORAL_TABLET | Freq: Four times a day (QID) | ORAL | 0 refills | Status: DC | PRN

## 2023-03-17 MED ORDER — DIAZEPAM 5 MG/ML IJ SOLN
5.0000 mg | Freq: Once | INTRAMUSCULAR | Status: AC
  Administered 2023-03-17: 5 mg via INTRAVENOUS
  Filled 2023-03-17: qty 2

## 2023-03-17 MED ORDER — CYCLOBENZAPRINE HCL 10 MG PO TABS: 10.0000 mg | ORAL_TABLET | Freq: Two times a day (BID) | ORAL | 0 refills | Status: DC | PRN

## 2023-03-17 MED ORDER — LIDOCAINE 5 % EX PTCH: 1.0000 | MEDICATED_PATCH | CUTANEOUS | 0 refills | Status: DC

## 2023-03-17 MED ORDER — HYDROMORPHONE HCL 1 MG/ML IJ SOLN
1.0000 mg | Freq: Once | INTRAMUSCULAR | Status: AC
  Administered 2023-03-17: 1 mg via INTRAVENOUS
  Filled 2023-03-17: qty 1

## 2023-03-17 MED ORDER — METHYLPREDNISOLONE 4 MG PO TBPK: ORAL_TABLET | ORAL | 0 refills | Status: DC

## 2023-03-17 NOTE — ED Notes (Signed)
Patient transported to MRI 

## 2023-03-17 NOTE — Discharge Instructions (Signed)
Follow-up with your neurosurgery team.  Recommend 1000 mg of Tylenol every 6 hours as needed for pain.  Take Medrol Dosepak as prescribed with next dose tomorrow morning.  Use lidocaine patches as prescribed.  I have prescribed you Flexeril and Roxicodone to take.  These medications are sedating.  Flexeril is a muscle relaxant.  Oxycodone is a narcotic pain medicine.  Do not mix with alcohol drugs or dangerous activities including driving.  Return if symptoms worsen.

## 2023-03-17 NOTE — ED Triage Notes (Addendum)
Patient arrived from [place] ED for MRI. Patient has had back surgery previously and today increased pain and some right leg numbness with same, no trauma. Patient had received morphine and zofran at [facility] prior to arrival. Alert and oriented. Reports ongoing nausea due to truck ride

## 2023-03-17 NOTE — ED Provider Notes (Signed)
[facility] EMERGENCY DEPARTMENT AT [facility] Provider Note   CSN: [id number] Arrival date & time: 03/17/23  1635     History  Chief Complaint  Patient presents with   Back Pain    Deborah Gibbs is a 32 y.o. female.  Patient here with increasing back pain the last several days now with numbness down the right leg that started today.  She denies any incontinence or urinary retention.  She has had back surgery in the past.  Denies any new trauma.  She is having a lot of discomfort and pain.  She denies any fever or chills.  She was transferred from [facility] for an MRI of her lumbar spine.  No weakness but mostly increased pain with right leg numbness.  She got pain medicine and nausea medicine at [facility] but still in extreme discomfort.  The history is provided by the patient.       Home Medications Prior to Admission medications   Medication Sig Start Date End Date Taking? Authorizing Provider  cyclobenzaprine (FLEXERIL) 10 MG tablet Take 1 tablet (10 mg total) by mouth 2 (two) times daily as needed for muscle spasms. 03/17/23  Yes Thiago Ragsdale, DO  lidocaine (LIDODERM) 5 % Place 1 patch onto the skin daily. Remove & Discard patch within 12 hours or as directed by MD 03/17/23  Yes Shellene Sweigert, DO  methylPREDNISolone (MEDROL DOSEPAK) 4 MG TBPK tablet Follow package insert 03/17/23  Yes Yarnell Arvidson, DO  oxyCODONE (ROXICODONE) 5 MG immediate release tablet Take 1 tablet (5 mg total) by mouth every 6 (six) hours as needed for up to 15 days for breakthrough pain. 03/17/23 04/01/23 Yes Zamiyah Resendes, DO  buPROPion (WELLBUTRIN XL) 150 MG 24 hr tablet Take 150 mg by mouth every morning. 04/18/20   [provider]  EPINEPHrine (EPIPEN 2-PAK) 0.3 mg/0.3 mL IJ SOAJ injection Inject 0.3 mg into the muscle as needed for anaphylaxis. 03/10/20   Robinson, [place] N, PA-C  Misc Natural Products (APPLE CIDER VINEGAR DIET) TABS Take by mouth.    [provider]   Multiple Vitamins-Minerals (ZINC PO) Take by mouth.    [provider]  predniSONE (STERAPRED UNI-PAK 21 TAB) 10 MG (21) TBPK tablet Take by mouth daily. Take 6 tabs by mouth daily  for 2 days, then 5 tabs for 2 days, then 4 tabs for 2 days, then 3 tabs for 2 days, 2 tabs for 2 days, then 1 tab by mouth daily for 2 days 05/25/22   Florene Route, MD  Riboflavin (B2 PO) Take by mouth.    [provider]  Thiamine HCl (B-1 PO) Take by mouth.    [provider]  traZODone (DESYREL) 100 MG tablet Take 100 mg by mouth at bedtime. 04/18/20   [provider]  VITAMIN D PO Take by mouth.    [provider]      Allergies    Amoxicillin, Bee venom, Diphenhydramine hcl, Meloxicam, Penicillins, and Pepcid [famotidine]    Review of Systems   Review of Systems  Physical Exam Updated Vital Signs BP 117/79   Pulse 77   Temp 98 F (36.7 C) (Oral)   Resp 18   SpO2 96%  Physical Exam Vitals and nursing note reviewed. Exam conducted with a chaperone present.  Constitutional:      General: She is not in acute distress.    Appearance: She is well-developed.  HENT:     Head: Normocephalic and atraumatic.  Eyes:  Extraocular Movements: Extraocular movements intact.     Conjunctiva/sclera: Conjunctivae normal.     Pupils: Pupils are equal, round, and reactive to light.  Cardiovascular:     Rate and Rhythm: Normal rate and regular rhythm.     Pulses: Normal pulses.     Heart sounds: No murmur heard. Pulmonary:     Effort: Pulmonary effort is normal. No respiratory distress.     Breath sounds: Normal breath sounds.  Abdominal:     Palpations: Abdomen is soft.     Tenderness: There is no abdominal tenderness.  Genitourinary:    Comments: Normal rectal tone chaperone present. Musculoskeletal:        General: No swelling.     Cervical back: Neck supple.     Comments: Tenderness to the paraspinal lumbar muscles on the right  Skin:    General: Skin  is warm and dry.     Capillary Refill: Capillary refill takes less than 2 seconds.     Comments: Surgical scar is clean dry and intact  Neurological:     General: No focal deficit present.     Mental Status: She is alert.     Cranial Nerves: No cranial nerve deficit.     Comments: Overall difficult to assess strength examination due to discomfort but appears that she is got good plantarflexion and dorsiflexion the right lower extremity she can lift his leg up off the bed but with pain, she has decreased sensation all throughout the right lower leg but normal neurological exam otherwise  Psychiatric:        Mood and Affect: Mood normal.     ED Results / Procedures / Treatments   Labs (all labs ordered are listed, but only abnormal results are displayed) Labs Reviewed  CBC WITH DIFFERENTIAL/PLATELET - Abnormal; Notable for the following components:      Result Value   WBC 12.3 (*)    Lymphs Abs 4.5 (*)    All other components within normal limits  BASIC METABOLIC PANEL - Abnormal; Notable for the following components:   CO2 21 (*)    BUN <5 (*)    All other components within normal limits    EKG None  Radiology MR LUMBAR SPINE WO CONTRAST  Result Date: 03/17/2023 CLINICAL DATA:  Low back pain, cauda equina syndrome suspected EXAM: MRI LUMBAR SPINE WITHOUT CONTRAST TECHNIQUE: Multiplanar, multisequence MR imaging of the lumbar spine was performed. No intravenous contrast was administered. COMPARISON:  Lumbar spine MRI 11/27/2022 FINDINGS: Segmentation:  Standard. Alignment:  Physiologic. Vertebrae: Degenerative endplate changes of the inferior and superior endplates of L5 on S1 postsurgical changes from posterior decompression at S1 Conus medullaris and cauda equina: Conus extends to the L1-L2 disc space level. Conus and cauda equina appear normal. Paraspinal and other soft tissues: Gallbladder is distended. There is nonspecific cystic lesion in the left adnexa (series 9, image 14).  Disc levels: T12-L1: Unremarkable L1-L2: Unremarkable L2-L3: Mild bilateral facet degenerative change. No spinal canal or neural foraminal narrowing. L3-L4: Redemonstrated large central disc protrusion, unchanged in size compared to 11/27/2022. Unchanged mild to moderate overall spinal canal narrowing. No neural foraminal narrowing. Mild bilateral facet degenerative change. L4-L5: Circumferential disc bulge with an annular fissure. Mild narrowing of the left lateral recess. No spinal canal narrowing. No neural foraminal narrowing. L5-S1: Posterior decompression. Minimal disc bulge. No spinal canal narrowing. No significant neural foraminal narrowing. Mild bilateral facet degenerative change. IMPRESSION: 1. Redemonstrated large central disc protrusion at L3-L4, unchanged in size compared  to 11/27/2022. Unchanged mild to moderate overall spinal canal narrowing at this level. 2. No other levels of spinal canal or neural foraminal narrowing. 3. New 4.0 x 2.9 cm cystic lesion in the left adnexa. Recommend further evaluation with a pelvic ultrasound if the patient has symptoms of left lower quadrant pain. Electronically Signed   By: Lorenza Cambridge M.D.   On: 03/17/2023 19:10    Procedures Procedures    Medications Ordered in ED Medications  dexamethasone (DECADRON) injection 10 mg (10 mg Intravenous Given 03/17/23 1738)  diazepam (VALIUM) injection 5 mg (5 mg Intravenous Given 03/17/23 1736)  HYDROmorphone (DILAUDID) injection 1 mg (1 mg Intravenous Given 03/17/23 1801)    ED Course/ Medical Decision Making/ A&P                                 Medical Decision Making Amount and/or Complexity of Data Reviewed Labs: ordered. Radiology: ordered.  Risk Prescription drug management.   Deborah Gibbs is here with worsening low back pain with numbness in her right lower leg.  She has a history of chronic back pain worse here the last few days with worsening tingling and numbness down the right leg.  She  denies any weakness.  No loss of bowel or bladder.  No urinary retention.  Sent here from [facility] for MRI of the low back to rule out cauda equina.  She is got normal rectal tone on exam.  She has subjective numbness to the right lower leg compared to the left.  But strength appears to be intact.  She denies any fever or chills.  Surgical scar is clean dry and intact.  This was done a while back.  Have no concern for infectious process.  I suspect that she is got ongoing chronic pain with acute breakthrough.  Will get MRI.  Patient given Valium and Dilaudid with great improvement.  MRI was done that showed no acute findings.  She has a large central disc protrusion at L3-L4 which is unchanged from prior MRI few months ago.  She has no cauda equina.  No other acute findings.  She is got a cyst on her left ovary but she is not having any left-sided pain and I think that is incidental.  She actually has follow-up with her OB this week and will have her discuss with them about that.  Ultimately she is feeling much better.  Will put her on a Medrol Dosepak, Flexeril, Roxicodone, lidocaine patches and recommend Tylenol and have her follow-up with her neurosurgery team.  Patient discharged in good condition.  She is neurovascular neuromuscular intact.  Have no concern for other acute process at this time.  This chart was dictated using voice recognition software.  Despite best efforts to proofread,  errors can occur which can change the documentation meaning.         Final Clinical Impression(s) / ED Diagnoses Final diagnoses:  Acute right-sided low back pain with sciatica, sciatica laterality unspecified    Rx / DC Orders ED Discharge Orders          Ordered    lidocaine (LIDODERM) 5 %  Every 24 hours        03/17/23 1953    oxyCODONE (ROXICODONE) 5 MG immediate release tablet  Every 6 hours PRN        03/17/23 1953    methylPREDNISolone (MEDROL DOSEPAK) 4 MG TBPK tablet  03/17/23  1953    cyclobenzaprine (FLEXERIL) 10 MG tablet  2 times daily PRN        03/17/23 1953              Virgina Norfolk, DO 03/17/23 2000

## 2023-03-17 NOTE — ED Notes (Signed)
Patient returned from MRI.

## 2023-03-19 ENCOUNTER — Encounter: Payer: Self-pay | Admitting: Student

## 2023-03-19 ENCOUNTER — Ambulatory Visit: Payer: 59 | Admitting: Student

## 2023-03-19 VITALS — BP 104/70 | HR 69 | Temp 97.3°F | Resp 18 | Ht 63.0 in | Wt 203.2 lb

## 2023-03-19 DIAGNOSIS — Z7689 Persons encountering health services in other specified circumstances: Secondary | ICD-10-CM

## 2023-03-19 DIAGNOSIS — F332 Major depressive disorder, recurrent severe without psychotic features: Secondary | ICD-10-CM

## 2023-03-19 DIAGNOSIS — R110 Nausea: Secondary | ICD-10-CM | POA: Insufficient documentation

## 2023-03-19 DIAGNOSIS — M5126 Other intervertebral disc displacement, lumbar region: Secondary | ICD-10-CM

## 2023-03-19 DIAGNOSIS — Z0000 Encounter for general adult medical examination without abnormal findings: Secondary | ICD-10-CM | POA: Insufficient documentation

## 2023-03-19 DIAGNOSIS — Z6836 Body mass index (BMI) 36.0-36.9, adult: Secondary | ICD-10-CM

## 2023-03-19 MED ORDER — FLUOXETINE HCL 20 MG PO CAPS: 20.0000 mg | ORAL_CAPSULE | Freq: Every day | ORAL | 0 refills | Status: DC

## 2023-03-19 MED ORDER — ONDANSETRON 4 MG PO TBDP: 4.0000 mg | ORAL_TABLET | Freq: Three times a day (TID) | ORAL | 0 refills | Status: AC | PRN

## 2023-03-19 NOTE — Progress Notes (Addendum)
 New Patient Office Visit  Subjective    Patient ID: Deborah Gibbs, female    DOB: 1990/10/15  Age: 32 y.o. MRN: [id number]  CC:  Chief Complaint  Patient presents with   Establish Care    New pt., new to area, moved here from [place].    HPI Deborah Gibbs presents to establish care. 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   The patient has a history of generalized anxiety, diagnosed 6 years ago using Sertraline  100 mg at bedtime. Deborah Gibbs tells me that Deborah Gibbs does not feel a difference. Deborah Gibbs endorses panic attacks in large crowds such as grocery stores, church, and her child school events. Symptoms are difficulty breathing, trembling, speech changes (cannot get words out), palm sweating and chest pressure. Deborah Gibbs also has Trazodone 100 mg at bedtime for insomnia and feels it is helpful for sleep. Deborah Gibbs has been admitted for psychiatric evaluation twice in the past for suicidal thoughts. Deborah Gibbs reports that Deborah Gibbs is now going to therapy, the first visit was 1 week ago and Deborah Gibbs notes really enjoying the therapist, Deborah Gibbs plans to keep visits once a week.  Today Deborah Gibbs denies feelings of suicidal ideation, homicidal ideation, worthlessness, helplessness, hypersomnia, or illicit drug use.    The patient states that Deborah Gibbs has been having low back pain with radiation of numbness from right lateral hip down the entire extremity. Deborah Gibbs says this all started after Deborah Gibbs received an epidural during labor. The patient sees Dr. Audie Bleacher with [facility] and was diagnosed with a displacement Lumbar intervertebral disc without myelopathy. Deborah Gibbs reports L5 surgery in June 2024 with plans for a spinal fusion before the end of the year (I do not have access to reports). Current interventions: lidocaine  patch, flexeril  10 mg prn, oxycodone  5 mg prn. The patient states that Deborah Gibbs is unable to afford the fee for specialist visits. Deborah Gibbs request to be managed by PCP.  On  03/17/2023 Deborah Gibbs went to the ER for increased back pain and numbness. Lumbar spine MRI completed (see RAD report) showed no acute findings. Deborah Gibbs has a large central disc protrusion at L3-L4 which is unchanged from prior MRI few months ago. Plan, Medrol  Dosepak, Flexeril , Roxicodone , lidocaine  patches and recommend Tylenol  and have her follow-up with her neurosurgery team. Today Deborah Gibbs  continues to have right leg numbness with radiation down the leg. Symptoms increase with sitting for long periods, gait abnormalities after sitting that resolves over 5 mins of walking. Deborah Gibbs denies loss of bowel or bladder.  Deborah Gibbs reports vomiting at least 6 times in a week due to pain, today Deborah Gibbs has vomited 3 times. Her current pain is 5/10.    Outpatient Encounter Medications as of 03/19/2023  Medication Sig   Acetaminophen  (TYLENOL  PO) Take 1 capsule by mouth every 6 (six) hours as needed.   ondansetron  (ZOFRAN -ODT) 4 MG disintegrating tablet Take 1 tablet (4 mg total) by mouth every 8 (eight) hours as needed for nausea or vomiting.   [DISCONTINUED] FLUoxetine  (PROZAC ) 20 MG capsule Take 1 capsule (20 mg total) by mouth daily.   [DISCONTINUED] Iron-FA-B Cmp-C-Biot-Probiotic (FUSION PLUS) CAPS Take 1 capsule by mouth daily.   [DISCONTINUED] ondansetron  (ZOFRAN ) 8 MG tablet Take 8 mg by mouth every 8 (eight) hours as needed for nausea.   [DISCONTINUED] sertraline  (ZOLOFT ) 100 MG tablet Take 100 mg by mouth daily.   [DISCONTINUED] buPROPion (WELLBUTRIN XL) 150 MG 24 hr tablet Take 150  mg by mouth every morning.   [DISCONTINUED] cyclobenzaprine  (FLEXERIL ) 10 MG tablet Take 1 tablet (10 mg total) by mouth 2 (two) times daily as needed for muscle spasms.   [DISCONTINUED] EPINEPHrine  (EPIPEN  2-PAK) 0.3 mg/0.3 mL IJ SOAJ injection Inject 0.3 mg into the muscle as needed for anaphylaxis.   [DISCONTINUED] lidocaine  (LIDODERM ) 5 % Place 1 patch onto the skin daily. Remove & Discard patch within 12 hours or as directed by MD    [DISCONTINUED] methylPREDNISolone  (MEDROL  DOSEPAK) 4 MG TBPK tablet Follow package insert   [DISCONTINUED] Misc Natural Products (APPLE CIDER VINEGAR DIET) TABS Take by mouth.   [DISCONTINUED] Multiple Vitamins-Minerals (ZINC PO) Take by mouth.   [DISCONTINUED] oxyCODONE  (ROXICODONE ) 5 MG immediate release tablet Take 1 tablet (5 mg total) by mouth every 6 (six) hours as needed for up to 15 days for breakthrough pain.   [DISCONTINUED] predniSONE  (STERAPRED UNI-PAK 21 TAB) 10 MG (21) TBPK tablet Take by mouth daily. Take 6 tabs by mouth daily  for 2 days, then 5 tabs for 2 days, then 4 tabs for 2 days, then 3 tabs for 2 days, 2 tabs for 2 days, then 1 tab by mouth daily for 2 days   [DISCONTINUED] Riboflavin (B2 PO) Take by mouth.   [DISCONTINUED] Thiamine HCl (B-1 PO) Take by mouth.   [DISCONTINUED] traZODone (DESYREL) 100 MG tablet Take 100 mg by mouth at bedtime.   [DISCONTINUED] VITAMIN D  PO Take by mouth.   No facility-administered encounter medications on file as of 03/19/2023.    Past Medical History:  Diagnosis Date   Abortion    Allergy    Anxiety    Back pain    Depression    Fibroid    Food allergy    SOBOE (shortness of breath on exertion)     Past Surgical History:  Procedure Laterality Date   WISDOM TOOTH EXTRACTION      Family History  Problem Relation Age of Onset   Hypertension Father    Depression Father    Anxiety disorder Father    Cancer Maternal Grandmother        Breast Cancer   Hypertension Mother    Depression Mother    Anxiety disorder Mother    Bipolar disorder Mother    Alcoholism Mother    Drug abuse Mother    Cancer Brother        neuroblastoma    Social History   Socioeconomic History   Marital status: Significant Other    Spouse name: Bonna Bustard   Number of children: Not on file   Years of education: Not on file   Highest education level: Not on file  Occupational History   Occupation: [employer]  Tobacco Use   Smoking  status: Never   Smokeless tobacco: Never  Vaping Use   Vaping status: Never Used  Substance and Sexual Activity   Alcohol use: Yes    Comment: weekly   Drug use: Yes    Types: Marijuana   Sexual activity: Not on file  Other Topics Concern   Not on file  Social History Narrative   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   Cardiovascular: Negative.   Gastrointestinal: Negative.   Genitourinary: Negative.   Musculoskeletal:  Positive for back pain.  Neurological:  Positive for weakness.  Psychiatric/Behavioral:  Positive for depression. The patient is nervous/anxious.         Objective    BP 104/70 (BP Location: Left Arm, Patient Position: Sitting)   Pulse 69   Temp (!) 97.3 F (36.3 C)   Resp 18   Ht 5\' 3"  (1.6 m)   Wt 203 lb 4 oz (92.2 kg)   SpO2 98%   BMI 36.00 kg/m   Physical Exam Vitals reviewed.  Constitutional:      Appearance: Normal appearance. Deborah Gibbs is obese.  Cardiovascular:     Rate and Rhythm: Normal rate and regular rhythm.     Pulses: Normal pulses.  Pulmonary:     Effort: Pulmonary effort is normal.     Breath sounds: Normal breath sounds.  Abdominal:     General: Bowel sounds are normal.     Palpations: Abdomen is soft.  Musculoskeletal:        General: Tenderness present. No swelling or deformity.     Lumbar back: Spasms and tenderness present. No swelling, edema or deformity.     Right lower leg: No edema.     Left lower leg: No edema.  Skin:    General: Skin is warm and dry.     Capillary Refill: Capillary refill takes less than 2  seconds.  Neurological:     General: No focal deficit present.     Mental Status: Deborah Gibbs is alert and oriented to person, place, and time.     Cranial Nerves: Cranial nerves 2-12 are intact.     Sensory: Sensory deficit present.     Motor: Weakness present.  Psychiatric:        Mood and Affect: Mood normal.        Behavior: Behavior normal.         Assessment & Plan:   Problem List Items Addressed This Visit     Displacement of lumbar intervertebral disc without myelopathy   The patient will continue to see Dr. Michale Age and to keep in contact with the office if there are any changes in her condition. I agree to manage medications, the patient was informed about pain management contract responsibilities. Deborah Gibbs verbalized agreement.        Severe episode of recurrent major depressive disorder, without psychotic features (HCC) - Primary   Deborah Gibbs does not feel that her symptoms are controlled. We will switch to fluoxetine  20 mg once daily. Deborah Gibbs will follow up in 3 months.       Intractable nausea   Deborah Gibbs will use zofran  4 mg as needed for nausea related to pain.       Relevant Medications   ondansetron  (ZOFRAN -ODT) 4 MG disintegrating tablet    Return in about 3 months (around 06/17/2023) for follow up of depression and pain managment .   Nasario Badder, NP

## 2023-03-19 NOTE — Assessment & Plan Note (Signed)
She does not feel that her symptoms are controlled. We will switch to fluoxetine 20 mg once daily. She will follow up in 3 months.

## 2023-03-19 NOTE — Patient Instructions (Signed)
Stop taking sertraline and switch to fluoxetine. Don't stop taking any medications abruptly.

## 2023-03-21 NOTE — Assessment & Plan Note (Addendum)
The patient will continue to see Dr. Franky Macho and to keep in contact with the office if there are any changes in her condition. I agree to manage medications, the patient was informed about pain management contract responsibilities. She verbalized agreement.

## 2023-03-21 NOTE — Assessment & Plan Note (Addendum)
 Deborah Gibbs

## 2023-03-21 NOTE — Assessment & Plan Note (Signed)
She will use zofran 4 mg as needed for nausea related to pain.

## 2023-03-25 ENCOUNTER — Encounter: Payer: Self-pay | Admitting: Student

## 2023-04-01 ENCOUNTER — Encounter: Payer: Self-pay | Admitting: Student

## 2023-04-01 ENCOUNTER — Ambulatory Visit: Payer: 59 | Admitting: Student

## 2023-04-01 VITALS — BP 108/70 | HR 83 | Resp 18 | Ht 62.0 in | Wt 202.0 lb

## 2023-04-01 DIAGNOSIS — Z5181 Encounter for therapeutic drug level monitoring: Secondary | ICD-10-CM

## 2023-04-01 DIAGNOSIS — M5416 Radiculopathy, lumbar region: Secondary | ICD-10-CM

## 2023-04-01 DIAGNOSIS — E559 Vitamin D deficiency, unspecified: Secondary | ICD-10-CM

## 2023-04-01 DIAGNOSIS — Z79899 Other long term (current) drug therapy: Secondary | ICD-10-CM | POA: Diagnosis not present

## 2023-04-01 DIAGNOSIS — Z6836 Body mass index (BMI) 36.0-36.9, adult: Secondary | ICD-10-CM | POA: Diagnosis not present

## 2023-04-01 DIAGNOSIS — Z0000 Encounter for general adult medical examination without abnormal findings: Secondary | ICD-10-CM

## 2023-04-01 DIAGNOSIS — Z23 Encounter for immunization: Secondary | ICD-10-CM | POA: Diagnosis not present

## 2023-04-01 DIAGNOSIS — R825 Elevated urine levels of drugs, medicaments and biological substances: Secondary | ICD-10-CM

## 2023-04-01 LAB — POCT URINE DRUG SCREEN
Methylenedioxyamphetamine: POSITIVE — AB
POC Amphetamine UR: POSITIVE — AB
POC BENZODIAZEPINES UR: NOT DETECTED
POC Barbiturate UR: POSITIVE — AB
POC Cocaine UR: POSITIVE — AB
POC DRUG SCREEN OXIDANTS URINE: NORMAL
POC Ecstasy UR: POSITIVE — AB
POC Marijuana UR: NOT DETECTED
POC Methadone UR: POSITIVE — AB
POC Methamphetamine UR: POSITIVE — AB
POC Opiate Ur: POSITIVE — AB
POC Oxycodone UR: POSITIVE — AB
POC PH URINE: NORMAL
POC PHENCYCLIDINE UR: NOT DETECTED
POC SPECIFIC GRAVITY URINE: NORMAL
POC TRICYCLICS UR: POSITIVE — AB
URINE TEMPERATURE: 94 [degF] (ref 90.0–100.0)

## 2023-04-01 MED ORDER — CYCLOBENZAPRINE HCL 10 MG PO TABS: 10.0000 mg | ORAL_TABLET | Freq: Two times a day (BID) | ORAL | 0 refills | Status: DC | PRN

## 2023-04-01 MED ORDER — OXYCODONE HCL 5 MG PO TABS: 5.0000 mg | ORAL_TABLET | Freq: Four times a day (QID) | ORAL | 0 refills | Status: AC | PRN

## 2023-04-01 MED ORDER — OXYCODONE HCL 5 MG PO TABS: 5.0000 mg | ORAL_TABLET | Freq: Four times a day (QID) | ORAL | 0 refills | Status: DC | PRN

## 2023-04-01 MED ORDER — EPINEPHRINE 0.3 MG/0.3ML IJ SOAJ: 0.3000 mg | INTRAMUSCULAR | 1 refills | Status: DC | PRN

## 2023-04-01 NOTE — Progress Notes (Signed)
Patient: Deborah Gibbs, Female    DOB: 11/21/1990, 32 y.o.   MRN: [id number] Edwena Blow, NP Visit Date: 04/01/2023   Chief Complaint  Patient presents with   Annual Exam    Annual exam    Subjective:   Annual physical exam:  Deborah Gibbs is a 32 y.o. female who presents today for annual health maintenance exam: The patient may be due for the following health maintenance studies: cervical pap/breast exam and screening labs.   For exercise and activity the patient participates in walking, she is limited due to spine issues.  Diet does not follow a diet.  Smoke: does not smoke Alcohol screening: drinks occasionally  Eye Exam: has not had one in years  Dental Exam: 05/2022  Past medical, surgical, social and family histories reviewed and updated.    Separate from wellness visit today:  **The patient was informed that charges may be incurred for discussing concerns outside of annual exam.   SDOH Screenings   Depression (PHQ2-9): Medium Risk (04/21/2020)  Tobacco Use: Low Risk  (04/01/2023)     USPSTF grade A and B recommendations - reviewed and addressed today  Immunizations and Health Maintenance: Health Maintenance  Topic Date Due   Hepatitis C Screening  Never done   COVID-19 Vaccine (1 - 2024-25 season) 04/17/2023 (Originally 12/09/2022)   Cervical Cancer Screening (HPV/Pap Cotest)  03/31/2024 (Originally 09/22/2022)   DTaP/Tdap/Td (3 - Td or Tdap) 11/05/2027   INFLUENZA VACCINE  Completed   HIV Screening  Completed   HPV VACCINES  Aged Out      Immunizations: Immunization History  Administered Date(s) Administered   Influenza, Mdck, Trivalent,PF 6+ MOS(egg free) 04/01/2023   Influenza,inj,Quad PF,6+ Mos 06/28/2015   Influenza-Unspecified 06/28/2015   Tdap 09/15/2015, 11/04/2017    Hep C Screening: will screen today   STD testing and prevention (HIV/chl/gon/syphilis):  see above, no additional testing desired by pt today  Intimate partner  violence:denies   Sexual History/Pain during Intercourse: Significant Other  Menstrual History/LMP/Abnormal Bleeding: IUD 03/21/23  32 yo pt presents for nexplanon replacement. Nexplanon placed 5 /2021 Procedure explained, all questions answered. US done today prior appt f/u on MRI ( Lt adnexa 4 cm cyst)- uterus wnl, Lt ovarian simple 4 cm cyst    No LMP recorded. Patient has had an implant.  Incontinence Symptoms: denies  Breast cancer:  Last Mammogram: *see HM list above BRCA gene screening: none  Cervical cancer screening: [facility]- Pap 2022 Pt has family hx of cancers - breast-maternal grandmother and aunts. Denies family history of ovarian, uterine, colon.  Skin cancer:  Hx of skin CA - none  Discussed atypical lesions   Colorectal cancer:   Colonoscopy is n/a   Discussed concerning signs and sx of CRC, pt denies   Lung cancer:   Low Dose CT Chest recommended if Age 66-80 years, 20 pack-year currently smoking OR have quit w/in 15years. Patient does not qualify.    Social History   Tobacco Use   Smoking status: Never   Smokeless tobacco: Never  Vaping Use   Vaping status: Never Used  Substance Use Topics   Alcohol use: Yes    Comment: weekly   Drug use: Yes    Types: Marijuana       Family History  Problem Relation Age of Onset   Hypertension Father    Depression Father    Anxiety disorder Father    Cancer Maternal Grandmother  Breast Cancer   Hypertension Mother    Depression Mother    Anxiety disorder Mother    Bipolar disorder Mother    Alcoholism Mother    Drug abuse Mother    Cancer Brother        neuroblastoma     Blood pressure/Hypertension: BP Readings from Last 3 Encounters:  04/01/23 108/70  03/19/23 104/70  03/17/23 117/79     Social History       Social History   Socioeconomic History   Marital status: Significant Other    Spouse name: Chyrel Masson   Number of children: Not on file   Years of  education: Not on file   Highest education level: Not on file  Occupational History   Occupation: [employer]  Tobacco Use   Smoking status: Never   Smokeless tobacco: Never  Vaping Use   Vaping status: Never Used  Substance and Sexual Activity   Alcohol use: Yes    Comment: weekly   Drug use: Yes    Types: Marijuana   Sexual activity: Not on file  Other Topics Concern   Not on file  Social History Narrative   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   2 sons ages    2019   2017   Single   No pets   Enjoys reading, soduku, coloring   Social Drivers of Health   Financial Resource Strain: Not on file  Food Insecurity: Not on file  Transportation Needs: Not on file  Physical Activity: Not on file  Stress: Not on file  Social Connections: Not on file    Family History        Family History  Problem Relation Age of Onset   Hypertension Father    Depression Father    Anxiety disorder Father    Cancer Maternal Grandmother        Breast Cancer   Hypertension Mother    Depression Mother    Anxiety disorder Mother    Bipolar disorder Mother    Alcoholism Mother    Drug abuse Mother    Cancer Brother        neuroblastoma    Patient Active Problem List   Diagnosis Date Noted   Encounter for annual physical exam 03/19/2023   Intractable nausea 03/19/2023   SOBOE (shortness of breath on exertion) 04/21/2020   Major depressive disorder 04/21/2020   Insomnia 04/21/2020   Vitamin D deficiency 04/21/2020   At risk for impaired metabolic function 04/21/2020   GAD (generalized anxiety disorder) 03/10/2020   Severe episode of recurrent major depressive disorder, without psychotic features (HCC) 04/07/2019   Low back pain 06/06/2016   Displacement of lumbar intervertebral disc without myelopathy 06/06/2016   Right lumbar radiculopathy 06/06/2016   RhD negative 06/28/2015    Past Surgical History:  Procedure Laterality Date   WISDOM TOOTH EXTRACTION        Current Outpatient Medications:    etonogestrel (NEXPLANON) 68 MG IMPL implant, 1 each by Subdermal route once., Disp: , Rfl:    Acetaminophen (TYLENOL PO), Take 1 capsule by mouth every 6 (six) hours as needed., Disp: , Rfl:    cyclobenzaprine (FLEXERIL) 10 MG tablet, Take 1 tablet (10 mg total) by mouth 2 (two) times daily as needed for muscle spasms., Disp: 20 tablet, Rfl: 0   EPINEPHrine (EPIPEN 2-PAK) 0.3 mg/0.3 mL IJ SOAJ injection, Inject 0.3 mg into the muscle as needed for anaphylaxis., Disp: 1 each, Rfl: 1   FLUoxetine (PROZAC)  20 MG capsule, Take 1 capsule (20 mg total) by mouth daily., Disp: 90 capsule, Rfl: 0   Iron-FA-B Cmp-C-Biot-Probiotic (FUSION PLUS) CAPS, Take 1 capsule by mouth daily., Disp: , Rfl:    lidocaine (LIDODERM) 5 %, Place 1 patch onto the skin daily. Remove & Discard patch within 12 hours or as directed by MD, Disp: 30 patch, Rfl: 0   Multiple Vitamins-Minerals (ZINC PO), Take by mouth., Disp: , Rfl:    ondansetron (ZOFRAN-ODT) 4 MG disintegrating tablet, Take 1 tablet (4 mg total) by mouth every 8 (eight) hours as needed for nausea or vomiting., Disp: 20 tablet, Rfl: 0   oxyCODONE (ROXICODONE) 5 MG immediate release tablet, Take 1 tablet (5 mg total) by mouth every 6 (six) hours as needed for breakthrough pain or severe pain (pain score 7-10)., Disp: 30 tablet, Rfl: 0   traZODone (DESYREL) 100 MG tablet, Take 100 mg by mouth at bedtime., Disp: , Rfl:   Allergies  Allergen Reactions   Amoxicillin Shortness Of Breath and Itching   Pepcid [Famotidine] Hives   Bee Venom    Diphenhydramine Hcl Hives, Swelling and Other (See Comments)    Reaction:  Facial/hand swelling   Meloxicam Hives, Swelling and Other (See Comments)    Reaction:  Facial/hand swelling  Pt has tolerated ibuprofen in the past without reaction   Penicillins Hives and Other (See Comments)    Has patient had a PCN reaction causing immediate rash, facial/tongue/throat swelling, SOB or  lightheadedness with hypotension: No Has patient had a PCN reaction causing severe rash involving mucus membranes or skin necrosis: No Has patient had a PCN reaction that required hospitalization No Has patient had a PCN reaction occurring within the last 10 years: Yes If all of the above answers are "NO", then may proceed with Cephalosporin use.     Patient Care Team: Edwena Blow, NP as PCP - General (Internal Medicine)  Advanced Care Planning:  A voluntary discussion about advance care planning including the explanation and discussion of advance directives.   Discussed health care proxy and Living will, and the patient was able to identify a health care proxy as Kaidynce Spyker her father..   Patient does not have a living will at present time.    Review of Systems Review of Systems  Constitutional: Negative.   HENT: Negative.    Eyes: Negative.   Respiratory: Negative.    Cardiovascular: Negative.   Gastrointestinal: Negative.   Genitourinary: Negative.   Musculoskeletal:  Positive for back pain. Negative for falls.  Skin: Negative.   Neurological: Negative.   Endo/Heme/Allergies: Negative.   Psychiatric/Behavioral:  Positive for depression. The patient is nervous/anxious.           Objective:   Vitals:   BP 108/70 (BP Location: Left Arm, Patient Position: Sitting, Cuff Size: Normal)   Pulse 83   Resp 18   Ht 5\' 2"  (1.575 m)   Wt 202 lb (91.6 kg)   SpO2 98%   BMI 36.95 kg/m    Physical Examination Physical Exam Vitals reviewed.  Constitutional:      Appearance: Normal appearance. She is obese.  HENT:     Head: Normocephalic and atraumatic.     Right Ear: Tympanic membrane, ear canal and external ear normal.     Left Ear: Tympanic membrane, ear canal and external ear normal.     Nose: Nose normal. No congestion.     Mouth/Throat:     Mouth: Mucous membranes are moist.  Pharynx: Oropharynx is clear.  Eyes:     Extraocular Movements: Extraocular movements  intact.     Conjunctiva/sclera: Conjunctivae normal.     Pupils: Pupils are equal, round, and reactive to light.  Cardiovascular:     Rate and Rhythm: Normal rate and regular rhythm.     Pulses: Normal pulses.     Heart sounds: Normal heart sounds.  Pulmonary:     Effort: Pulmonary effort is normal.     Breath sounds: Normal breath sounds.  Abdominal:     General: Bowel sounds are normal. There is no distension.     Palpations: Abdomen is soft.     Tenderness: There is no abdominal tenderness. There is no right CVA tenderness, left CVA tenderness or guarding.  Musculoskeletal:        General: Tenderness present. No deformity or signs of injury.     Cervical back: Normal range of motion and neck supple.     Lumbar back: Spasms present. No swelling or edema. Decreased range of motion.     Right lower leg: No edema.     Left lower leg: No edema.     Comments: Surgical scar present, no signs of inflammation or edema.   Lymphadenopathy:     Cervical: No cervical adenopathy.  Skin:    General: Skin is warm and dry.     Capillary Refill: Capillary refill takes less than 2 seconds.  Neurological:     General: No focal deficit present.     Mental Status: She is alert and oriented to person, place, and time.  Psychiatric:        Mood and Affect: Mood normal.        Behavior: Behavior normal.          Assessment & Plan:     Encounter for annual physical exam Assessment & Plan: CPE completed today with flu shot..  Screening labs include anemia panel, CBC, CMP, TSH, hemoglobin A1c and lipids.   USPSTF grade A and B recommendations reviewed with patient; age-appropriate recommendations, preventive care, screening tests, etc discussed and encouraged; healthy living encouraged; see AVS for patient education given to patient   Discussed importance of 150 minutes of physical activity weekly, AHA exercise recommendations given to pt in AVS/handout   Discussed importance of healthy  diet:  eating lean meats and proteins, avoiding trans fats and saturated fats, avoid simple sugars and excessive carbs in diet, eat 6 servings of fruit/vegetables daily and drink plenty of water and avoid sweet beverages.     Recommended pt to do annual eye exam and routine dental exams/cleanings   Depression, alcohol, fall screening completed as documented above and per flowsheets   Advance Care planning information and packet discussed and offered today, encouraged pt to discuss with family members/spouse/partner/friends and complete Advanced directive packet and bring copy to office   Orders: -     Hemoglobin A1c -     Lipid panel -     CBC with Differential/Platelet -     Comprehensive metabolic panel -     TSH -     Influenza, MDCK, trivalent, PF(Flucelvax egg-free) -     Hepatitis C antibody -     Iron, TIBC and Ferritin Panel  Vitamin D deficiency Assessment & Plan: Labs for vitamin D collected.  Orders: -     VITAMIN D 25 Hydroxy (Vit-D Deficiency, Fractures)  Right lumbar radiculopathy Assessment & Plan: The patient continues to follow neurosurgery, there was a discrepancy with  her visits.  She will not have to wait until February 2025 but continues to call for cancellation list. Update UDS Refill oxycodone #30 and cyclobenzaprine.   I have reviewed patient's records, patient has signed controlled agreement plan.  This will be scanned in and placed in her file.   I have personally reviewed the NCCSRS recipient query regarding this patient and do not see any irregularities or concerning findings.  Controlled Substance Discussion: Discussed risk of controlled substances including possible unintentional overdose, especially if mixed with alcohol or other pills; typical speech given including illegal to share, always keep in the original bottle, safeguard medicine, do NOT mix with alcohol, other pain pills, "nerve" or anxiety pills, or sleeping pills; I am not obligated to  approve of early refill or give new prescription if medicine is lost, stolen, or destroyed even with a police report, etc.; patient agrees with plan.    Orders: -     POCT Urine drug screen  Other orders -     EPINEPHrine; Inject 0.3 mg into the muscle as needed for anaphylaxis.  Dispense: 1 each; Refill: 1 -     Cyclobenzaprine HCl; Take 1 tablet (10 mg total) by mouth 2 (two) times daily as needed for muscle spasms.  Dispense: 20 tablet; Refill: 0 -     oxyCODONE HCl; Take 1 tablet (5 mg total) by mouth every 6 (six) hours as needed for breakthrough pain or severe pain (pain score 7-10).  Dispense: 30 tablet; Refill: 0        Health Maintenance  Topic Date Due   Hepatitis C Screening  Never done   COVID-19 Vaccine (1 - 2024-25 season) 04/17/2023 (Originally 12/09/2022)   Cervical Cancer Screening (HPV/Pap Cotest)  03/31/2024 (Originally 09/22/2022)   DTaP/Tdap/Td (3 - Td or Tdap) 11/05/2027   INFLUENZA VACCINE  Completed   HIV Screening  Completed   HPV VACCINES  [employer], NP 04/01/23 4:53 PM

## 2023-04-01 NOTE — Assessment & Plan Note (Addendum)
CPE completed today with flu shot..  Screening labs include anemia panel, CBC, CMP, TSH, hemoglobin A1c and lipids.   USPSTF grade A and B recommendations reviewed with patient; age-appropriate recommendations, preventive care, screening tests, etc discussed and encouraged; healthy living encouraged; see AVS for patient education given to patient   Discussed importance of 150 minutes of physical activity weekly, AHA exercise recommendations given to pt in AVS/handout   Discussed importance of healthy diet:  eating lean meats and proteins, avoiding trans fats and saturated fats, avoid simple sugars and excessive carbs in diet, eat 6 servings of fruit/vegetables daily and drink plenty of water and avoid sweet beverages.     Recommended pt to do annual eye exam and routine dental exams/cleanings   Depression, alcohol, fall screening completed as documented above and per flowsheets   Advance Care planning information and packet discussed and offered today, encouraged pt to discuss with family members/spouse/partner/friends and complete Advanced directive packet and bring copy to office

## 2023-04-01 NOTE — Assessment & Plan Note (Addendum)
The patient continues to follow neurosurgery, there was a discrepancy with her visits.  She will not have to wait until February 2025 but continues to call for cancellation list. Update UDS- urine positive for multiple drugs- Urine is being sent for confirmation.  Refill oxycodone #30 and cyclobenzaprine for spasms.  I have reviewed patient's records, patient has signed controlled agreement plan.  This will be scanned in and placed in her file.   I have personally reviewed the NCCSRS recipient query regarding this patient and do not see any irregularities or concerning findings.  Controlled Substance Discussion: Discussed risk of controlled substances including possible unintentional overdose, especially if mixed with alcohol or other pills; typical speech given including illegal to share, always keep in the original bottle, safeguard medicine, do NOT mix with alcohol, other pain pills, "nerve" or anxiety pills, or sleeping pills; I am not obligated to approve of early refill or give new prescription if medicine is lost, stolen, or destroyed even with a police report, etc.; patient agrees with plan.

## 2023-04-01 NOTE — Assessment & Plan Note (Signed)
Labs for vitamin D collected.

## 2023-04-02 LAB — SPECIMEN STATUS REPORT

## 2023-04-03 LAB — LIPID PANEL
Chol/HDL Ratio: 3.9 {ratio} (ref 0.0–4.4)
Cholesterol, Total: 154 mg/dL (ref 100–199)
HDL: 40 mg/dL (ref >39)
LDL Chol Calc (NIH): 94 mg/dL (ref 0–99)
Triglycerides: 107 mg/dL (ref 0–149)
VLDL Cholesterol Cal: 20 mg/dL (ref 5–40)

## 2023-04-03 LAB — DRUG SCREEN 16 WITH REFLEX CONFIRMATION

## 2023-04-03 LAB — COMPREHENSIVE METABOLIC PANEL
ALT: 21 [IU]/L (ref 0–32)
AST: 17 [IU]/L (ref 0–40)
Albumin: 4.6 g/dL (ref 4.0–5.0)
Alkaline Phosphatase: 47 [IU]/L (ref 44–121)
BUN/Creatinine Ratio: 9 (ref 9–23)
BUN: 6 mg/dL (ref 6–20)
Bilirubin Total: 0.4 mg/dL (ref 0.0–1.2)
CO2: 21 mmol/L (ref 20–29)
Calcium: 9.6 mg/dL (ref 8.7–10.2)
Chloride: 105 mmol/L (ref 96–106)
Creatinine, Ser: 0.68 mg/dL (ref 0.57–1.00)
Globulin, Total: 2.4 g/dL (ref 1.5–4.5)
Glucose: 74 mg/dL (ref 70–99)
Potassium: 4 mmol/L (ref 3.5–5.2)
Sodium: 142 mmol/L (ref 134–144)
Total Protein: 7 g/dL (ref 6.0–8.5)
eGFR: 120 mL/min/{1.73_m2} (ref >59)

## 2023-04-03 LAB — CBC WITH DIFFERENTIAL/PLATELET

## 2023-04-03 LAB — HEMOGLOBIN A1C

## 2023-04-03 LAB — VITAMIN D 25 HYDROXY (VIT D DEFICIENCY, FRACTURES): Vit D, 25-Hydroxy: 13.6 ng/mL — ABNORMAL LOW (ref 30.0–100.0)

## 2023-04-03 LAB — HEPATITIS C ANTIBODY: Hep C Virus Ab: NONREACTIVE

## 2023-04-03 LAB — TSH: TSH: 0.91 u[IU]/mL (ref 0.450–4.500)

## 2023-04-04 LAB — CBC WITH DIFFERENTIAL/PLATELET
Basophils Absolute: 0.1 10*3/uL (ref 0.0–0.2)
Basos: 0 %
EOS (ABSOLUTE): 0.2 10*3/uL (ref 0.0–0.4)
Eos: 2 %
Hematocrit: 40 % (ref 34.0–46.6)
Hemoglobin: 13.4 g/dL (ref 11.1–15.9)
Immature Grans (Abs): 0 10*3/uL (ref 0.0–0.1)
Immature Granulocytes: 0 %
Lymphocytes Absolute: 4.1 10*3/uL — ABNORMAL HIGH (ref 0.7–3.1)
Lymphs: 30 %
MCH: 29.6 pg (ref 26.6–33.0)
MCHC: 33.5 g/dL (ref 31.5–35.7)
MCV: 88 fL (ref 79–97)
Monocytes Absolute: 1 10*3/uL — ABNORMAL HIGH (ref 0.1–0.9)
Monocytes: 7 %
Neutrophils Absolute: 8.3 10*3/uL — ABNORMAL HIGH (ref 1.4–7.0)
Neutrophils: 61 %
Platelets: 295 10*3/uL (ref 150–450)
RBC: 4.53 x10E6/uL (ref 3.77–5.28)
RDW: 12.3 % (ref 11.7–15.4)
WBC: 13.7 10*3/uL — ABNORMAL HIGH (ref 3.4–10.8)

## 2023-04-04 LAB — HEMOGLOBIN A1C
Est. average glucose Bld gHb Est-mCnc: 117 mg/dL
Hgb A1c MFr Bld: 5.7 % — ABNORMAL HIGH (ref 4.8–5.6)

## 2023-04-04 LAB — SPECIMEN STATUS REPORT

## 2023-04-05 ENCOUNTER — Other Ambulatory Visit: Payer: Self-pay | Admitting: Student

## 2023-04-05 LAB — IRON,TIBC AND FERRITIN PANEL

## 2023-04-05 NOTE — Progress Notes (Signed)
Patient called.  Patient aware.  

## 2023-04-05 NOTE — Progress Notes (Signed)
I have reviewed your lab results. You are prediabetic at 5.7, monitor your sugar and carbohydrate intake. Your vitamin D level is very low. This could be the cause of your fatigue we need to start you on vitamin D supplement. Yours is 13.6 the normal range is 30-100. Start taking 1000 international units (20 to 25 micrograms) daily. This can be purchased over the counter.

## 2023-04-06 LAB — IRON,TIBC AND FERRITIN PANEL
Ferritin: 100 ng/mL (ref 15–150)
Iron Saturation: 25 % (ref 15–55)
Iron: 89 ug/dL (ref 27–159)
Total Iron Binding Capacity: 362 ug/dL (ref 250–450)
UIBC: 273 ug/dL (ref 131–425)

## 2023-04-06 LAB — SPECIMEN STATUS REPORT

## 2023-04-08 ENCOUNTER — Encounter: Payer: 59 | Admitting: Student

## 2023-04-15 ENCOUNTER — Encounter: Payer: Self-pay | Admitting: Student

## 2023-04-25 ENCOUNTER — Encounter: Payer: Self-pay | Admitting: Student

## 2023-05-08 ENCOUNTER — Encounter: Payer: Self-pay | Admitting: Student

## 2023-05-08 ENCOUNTER — Ambulatory Visit: Payer: Commercial Managed Care - HMO | Admitting: Student

## 2023-05-08 VITALS — BP 114/76 | HR 88 | Temp 98.4°F | Resp 18 | Ht 62.0 in | Wt 203.4 lb

## 2023-05-08 DIAGNOSIS — H6690 Otitis media, unspecified, unspecified ear: Secondary | ICD-10-CM | POA: Insufficient documentation

## 2023-05-08 DIAGNOSIS — J0110 Acute frontal sinusitis, unspecified: Secondary | ICD-10-CM | POA: Insufficient documentation

## 2023-05-08 DIAGNOSIS — H65191 Other acute nonsuppurative otitis media, right ear: Secondary | ICD-10-CM | POA: Diagnosis not present

## 2023-05-08 LAB — POC COVID19 BINAXNOW: SARS Coronavirus 2 Ag: NEGATIVE

## 2023-05-08 LAB — POC INFLUENZA A&B (BINAX/QUICKVUE)
Influenza A, POC: NEGATIVE
Influenza B, POC: NEGATIVE

## 2023-05-08 MED ORDER — CIPROFLOXACIN-DEXAMETHASONE 0.3-0.1 % OT SUSP: 4.0000 [drp] | Freq: Two times a day (BID) | OTIC | 0 refills | Status: DC

## 2023-05-08 NOTE — Assessment & Plan Note (Signed)
She will use Ciprodex otic susp for 7 days for infection. Encouraged OTC antihistamines.  Flu and COVID were negative.

## 2023-05-08 NOTE — Patient Instructions (Addendum)
To help with ear wax build up you can purchase Debrox ear cleaning solution from your pharmacy.

## 2023-05-08 NOTE — Progress Notes (Signed)
Acute Office Visit  Subjective:     Patient ID: Deborah Gibbs, female    DOB: May 15, 1990, 33 y.o.   MRN: [id number]  Chief Complaint  Patient presents with   Cough    X 2 days, headache, losing her voice, runny nose, clear discharge, dry cough, denies sore throat, no sinus pressure, no PND, also c/o: right ear hurts.  Using otc Nyquil.    HPI  Patient is in today for 2 days of headache, runny nose with clear discharge, right ear pain that she describes as dull-sharp aching. Her household is recovering from the flu. She has been using Nyquil and Dayquil without relief. She denies cough, chest congestion, myalgias, and no fevers.   Review of Systems  Constitutional: Negative.   HENT:  Positive for congestion, ear discharge and ear pain. Negative for sinus pain and sore throat.   Eyes: Negative.   Respiratory: Negative.    Cardiovascular: Negative.   Gastrointestinal: Negative.   Skin: Negative.   Neurological:  Positive for headaches. Negative for dizziness, tingling and tremors.  Psychiatric/Behavioral: Negative.          Objective:    BP 114/76 (BP Location: Right Arm, Patient Position: Sitting)   Pulse 88   Temp 98.4 F (36.9 C) (Temporal)   Resp 18   Ht 5\' 2"  (1.575 m)   Wt 203 lb 6 oz (92.3 kg)   SpO2 97%   BMI 37.20 kg/m    Physical Exam Constitutional:      Appearance: Normal appearance.  HENT:     Head: Normocephalic and atraumatic.     Right Ear: Hearing normal. A middle ear effusion is present. Tympanic membrane is erythematous and bulging.     Left Ear: Hearing, tympanic membrane, ear canal and external ear normal.     Nose: Nose normal. No congestion or rhinorrhea.     Mouth/Throat:     Mouth: Mucous membranes are moist.     Pharynx: Oropharynx is clear. No oropharyngeal exudate or posterior oropharyngeal erythema.  Eyes:     Conjunctiva/sclera: Conjunctivae normal.     Pupils: Pupils are equal, round, and reactive to light.  Cardiovascular:      Rate and Rhythm: Normal rate and regular rhythm.     Pulses: Normal pulses.     Heart sounds: Normal heart sounds.  Pulmonary:     Effort: Pulmonary effort is normal.     Breath sounds: Normal breath sounds.  Abdominal:     General: Abdomen is flat. Bowel sounds are normal.     Palpations: Abdomen is soft.     Tenderness: There is no abdominal tenderness.  Musculoskeletal:     Cervical back: Normal range of motion. No tenderness.  Lymphadenopathy:     Cervical: No cervical adenopathy.  Skin:    General: Skin is warm and dry.     Capillary Refill: Capillary refill takes less than 2 seconds.  Neurological:     General: No focal deficit present.     Mental Status: She is alert and oriented to person, place, and time.  Psychiatric:        Mood and Affect: Mood normal.        Behavior: Behavior normal.     Results for orders placed or performed in visit on 05/08/23  POC COVID-19 BinaxNow  Result Value Ref Range   SARS Coronavirus 2 Ag Negative Negative  POC Influenza A&B(BINAX/QUICKVUE)  Result Value Ref Range   Influenza A, POC Negative Negative  Influenza B, POC Negative Negative        Assessment & Plan:   Problem List Items Addressed This Visit     Otitis media - Primary   She will use Ciprodex otic susp for 7 days for infection. Encouraged OTC antihistamines.  Flu and COVID were negative.       Relevant Medications   oxyCODONE (OXY IR/ROXICODONE) 5 MG immediate release tablet   ciprofloxacin-dexamethasone (CIPRODEX) OTIC suspension   Other Relevant Orders   POC COVID-19 BinaxNow (Completed)   POC Influenza A&B(BINAX/QUICKVUE) (Completed)    Meds ordered this encounter  Medications   ciprofloxacin-dexamethasone (CIPRODEX) OTIC suspension    Sig: Place 4 drops into the right ear 2 (two) times daily for 7 days.    Dispense:  2.8 mL    Refill:  0    Return if symptoms worsen or fail to improve.  Edwena Blow, NP

## 2023-05-08 NOTE — Progress Notes (Signed)
The patients urine drug screen returned with multiple positive readings. This was found to be an error and was not read correctly by staff.  The patient was informed of this and was very understanding. We apologize for these discrepancies.

## 2023-05-17 ENCOUNTER — Encounter: Payer: Self-pay | Admitting: Student

## 2023-05-17 MED ORDER — AZITHROMYCIN 250 MG PO TABS: ORAL_TABLET | ORAL | 0 refills | Status: AC

## 2023-06-03 ENCOUNTER — Ambulatory Visit: Payer: Commercial Managed Care - HMO | Admitting: Student

## 2023-06-03 ENCOUNTER — Encounter: Payer: Self-pay | Admitting: Student

## 2023-06-03 VITALS — BP 112/72 | HR 64 | Temp 98.2°F | Resp 18 | Ht 62.0 in | Wt 204.0 lb

## 2023-06-03 DIAGNOSIS — F411 Generalized anxiety disorder: Secondary | ICD-10-CM

## 2023-06-03 DIAGNOSIS — M5416 Radiculopathy, lumbar region: Secondary | ICD-10-CM | POA: Diagnosis not present

## 2023-06-03 DIAGNOSIS — F332 Major depressive disorder, recurrent severe without psychotic features: Secondary | ICD-10-CM | POA: Diagnosis not present

## 2023-06-03 DIAGNOSIS — F329 Major depressive disorder, single episode, unspecified: Secondary | ICD-10-CM

## 2023-06-03 MED ORDER — OXYCODONE HCL 5 MG PO TABS: 5.0000 mg | ORAL_TABLET | ORAL | 0 refills | Status: DC | PRN

## 2023-06-03 MED ORDER — IBUPROFEN 600 MG PO TABS: 600.0000 mg | ORAL_TABLET | Freq: Four times a day (QID) | ORAL | 1 refills | Status: DC | PRN

## 2023-06-03 MED ORDER — HYDROXYZINE PAMOATE 50 MG PO CAPS: 100.0000 mg | ORAL_CAPSULE | Freq: Three times a day (TID) | ORAL | 0 refills | Status: DC | PRN

## 2023-06-03 MED ORDER — FLUOXETINE HCL 20 MG PO CAPS: 20.0000 mg | ORAL_CAPSULE | Freq: Every day | ORAL | 0 refills | Status: DC

## 2023-06-03 NOTE — Assessment & Plan Note (Addendum)
 She has pretty severe social anxiety resulting in panic attacks in public. We will try adding hydroxyzine 50 mg TID prn  to fluoxetine.  We discussed the need to continue therapeutic regimen with, space and avoiding triggers as much as possible.  Follow-up in 3 months

## 2023-06-03 NOTE — Assessment & Plan Note (Deleted)
 Drug screen completed-results are negative. Test not ordered due to previous error with results.  Refill for oxycodone. We discussed the use of tylenol and ibuprofen. She's been using NSAID 1000 mg and tylenol prior to using oxycodone. Order for advil 800 mg instead.   Dr. Franky Macho to follow as needed.   I have personally reviewed the NCCSRS recipient query regarding this patient and do not see any irregularities or concerning findings.  Controlled Substance Discussion: Discussed risk of controlled substances including possible unintentional overdose, especially if mixed with alcohol or other pills; typical speech given including illegal to share, always keep in the original bottle, safeguard medicine, do NOT mix with alcohol, other pain pills, "nerve" or anxiety pills, or sleeping pills; I am not obligated to approve of early refill or give new prescription if medicine is lost, stolen, or destroyed even with a police report, etc.; patient agrees with plan.

## 2023-06-03 NOTE — Patient Instructions (Addendum)
 Your vitamin D level is very low. This could be the cause of your fatigue we need to start you on vitamin D supplement.  You should supplement with 800 to 1000 international units (20 to 25 micrograms) daily. This can be purchased over the counter.  A repeat D level should be obtained after approximately three months of therapy to assure obtaining the goal serum level.

## 2023-06-03 NOTE — Assessment & Plan Note (Addendum)
 Patient experiencing positive results from starting fluoxetine 20 mg.  Refills have been provided.

## 2023-06-03 NOTE — Assessment & Plan Note (Signed)
 Drug screen completed-results are negative. Test not ordered due to previous error with results. Refill for oxycodone. We discussed the use of tylenol and ibuprofen.  Patient has been using a high dose of ibuprofen I want her to monitor use. I am sending in 600 mg Advil prn . Neurosurgery to follow on as-needed basis.  I have personally reviewed the [place] recipient query regarding this patient and do not see any irregularities or concerning findings.  Controlled Substance Discussion: Discussed risk of controlled substances including possible unintentional overdose, especially if mixed with alcohol or other pills; typical speech given including illegal to share, always keep in the original bottle, safeguard medicine, do NOT mix with alcohol, other pain pills, "nerve" or anxiety pills, or sleeping pills; I am not obligated to approve of early refill or give new prescription if medicine is lost, stolen, or destroyed even with a police report, etc.; patient agrees with plan.

## 2023-06-03 NOTE — Progress Notes (Addendum)
 Established Patient Office Visit  Subjective   Patient ID: Deborah Gibbs, female    DOB: 12-18-1990  Age: 33 y.o. MRN: [id number]  Chief Complaint  Patient presents with   Follow-up    3 Month Follow UP    HPI  Deborah Gibbs is a 34 year old female presenting for 3 month follow up:  Lumbar radiculopathy:  Patient reports feeling a popping sensation when bending over to get something off the floor quickly. She called Dr. Franky Macho neurosurgeon who says that his is not abnormal to have post operatively.  At that time her pain increased at the length of 2 days causing her to use oxycodone as prescribed.  She was given stretching exercises that seemed to help resolve spasms and sharp pain.  She endorses using tylenol 1000 mg and 1000 mg of ibuprofen prior to using on oxycodone 5 mg.  Patient denies any new injuries or complaints.  MDD: At our last visit patient noted that her depression symptoms were not controlled.  We started her on ffluoxetine 20 mg once daily. Today she reports feels "so much better" with her depression and denies feelings of suicidal ideation, homicidal ideation, worthlessness, helplessness, hypersomnia, or illicit drug use.   GAD: Patient reports that her anxiety has not been controlled.  She notes triggers areare large crowds and tight spaces. She reports going to [facility] and immediately feeling overwhelmed to where she had to sit by the shopping carts for 10 mins before being able to complete her shopping.  Reporting shortness of breath, hot, crying, and chest tightness. At home she has incorporated the use of a "mommy corner" giving her time to calm down, she says her children are aware and give her the opportunity to calm down before returning to their common space.     Review of Systems  Constitutional: Negative.   Eyes: Negative.   Respiratory: Negative.    Cardiovascular: Negative.   Gastrointestinal: Negative.   Genitourinary: Negative.   Musculoskeletal:   Positive for back pain.  Skin: Negative.   Neurological: Negative.   Endo/Heme/Allergies: Negative.   Psychiatric/Behavioral:  The patient is nervous/anxious.       Objective:     BP 112/72 (BP Location: Left Arm, Patient Position: Sitting, Cuff Size: Normal)   Pulse 64   Temp 98.2 F (36.8 C)   Resp 18   Ht 5\' 2"  (1.575 m)   Wt 204 lb (92.5 kg)   SpO2 95%   BMI 37.31 kg/m    Physical Exam Vitals reviewed.  Constitutional:      Appearance: Normal appearance. She is obese.  Cardiovascular:     Rate and Rhythm: Normal rate and regular rhythm.     Pulses: Normal pulses.     Heart sounds: Normal heart sounds.  Pulmonary:     Effort: Pulmonary effort is normal.     Breath sounds: Normal breath sounds.  Abdominal:     General: Bowel sounds are normal.     Palpations: Abdomen is soft.  Musculoskeletal:        General: Normal range of motion.     Right lower leg: No edema.     Left lower leg: No edema.  Skin:    General: Skin is warm and dry.     Capillary Refill: Capillary refill takes less than 2 seconds.  Neurological:     General: No focal deficit present.     Mental Status: She is alert and oriented to person, place, and  time.  Psychiatric:        Mood and Affect: Mood normal.        Behavior: Behavior normal.     No results found for any visits on 06/03/23.   The ASCVD Risk score (Arnett DK, et al., 2019) failed to calculate for the following reasons:   The 2019 ASCVD risk score is only valid for ages 61 to 65    Assessment & Plan:   Problem List Items Addressed This Visit     GAD (generalized anxiety disorder)   She has pretty severe social anxiety resulting in panic attacks in public. We will try adding hydroxyzine 50 mg TID prn  to fluoxetine.  We discussed the need to continue therapeutic regimen with, space and avoiding triggers as much as possible.  Follow-up in 3 months      Relevant Medications   FLUoxetine (PROZAC) 20 MG capsule    hydrOXYzine (VISTARIL) 50 MG capsule   Right lumbar radiculopathy   Drug screen completed-results are negative. Test not ordered due to previous error with results. Refill for oxycodone. We discussed the use of tylenol and ibuprofen.  Patient has been using a high dose of ibuprofen I want her to monitor use. I am sending in 600 mg Advil prn . Neurosurgery to follow on as-needed basis.  I have personally reviewed the NCCS[place] recipient query regarding this patient and do not see any irregularities or concerning findings.  Controlled Substance Discussion: Discussed risk of controlled substances including possible unintentional overdose, especially if mixed with alcohol or other pills; typical speech given including illegal to share, always keep in the original bottle, safeguard medicine, do NOT mix with alcohol, other pain pills, "nerve" or anxiety pills, or sleeping pills; I am not obligated to approve of early refill or give new prescription if medicine is lost, stolen, or destroyed even with a police report, etc.; patient agrees with plan.        Relevant Medications   ibuprofen (ADVIL) 600 MG tablet   oxyCODONE (OXY IR/ROXICODONE) 5 MG immediate release tablet   FLUoxetine (PROZAC) 20 MG capsule   hydrOXYzine (VISTARIL) 50 MG capsule   Severe episode of recurrent major depressive disorder, without psychotic features (HCC)    >>ASSESSMENT AND PLAN FOR MAJOR DEPRESSIVE DISORDER WRITTEN ON 06/03/2023 12:42 PM BY Lorien Shingler L, NP  Patient experiencing positive results with fluoxetine 20 mg.  Refills have been provided.      Relevant Medications   FLUoxetine (PROZAC) 20 MG capsule   hydrOXYzine (VISTARIL) 50 MG capsule   Other Visit Diagnoses       Major depressive disorder, remission status unspecified, unspecified whether recurrent    -  Primary   Relevant Medications   FLUoxetine (PROZAC) 20 MG capsule   hydrOXYzine (VISTARIL) 50 MG capsule       Return in about 3 months (around  08/31/2023).    Edwena Blow, NP

## 2023-06-03 NOTE — Assessment & Plan Note (Addendum)
>>  ASSESSMENT AND PLAN FOR MAJOR DEPRESSIVE DISORDER WRITTEN ON 06/03/2023 12:42 PM BY Etheleen Valtierra L, NP  Patient experiencing positive results with fluoxetine 20 mg.  Refills have been provided.

## 2023-06-03 NOTE — Addendum Note (Signed)
 Addended by: Edwena Blow on: 06/03/2023 10:01 AM   Modules accepted: Orders

## 2023-06-10 ENCOUNTER — Other Ambulatory Visit: Payer: Self-pay | Admitting: Student

## 2023-06-10 DIAGNOSIS — F411 Generalized anxiety disorder: Secondary | ICD-10-CM

## 2023-06-13 ENCOUNTER — Ambulatory Visit: Payer: 59 | Admitting: Student

## 2023-06-24 ENCOUNTER — Ambulatory Visit: Admitting: Student

## 2023-06-24 ENCOUNTER — Encounter: Payer: Self-pay | Admitting: Student

## 2023-06-24 VITALS — BP 118/72 | HR 77 | Temp 98.0°F | Resp 18 | Ht 63.0 in | Wt 207.0 lb

## 2023-06-24 DIAGNOSIS — Z6836 Body mass index (BMI) 36.0-36.9, adult: Secondary | ICD-10-CM | POA: Diagnosis not present

## 2023-06-24 DIAGNOSIS — E669 Obesity, unspecified: Secondary | ICD-10-CM | POA: Diagnosis not present

## 2023-06-24 DIAGNOSIS — Z6834 Body mass index (BMI) 34.0-34.9, adult: Secondary | ICD-10-CM | POA: Insufficient documentation

## 2023-06-24 MED ORDER — TIRZEPATIDE-WEIGHT MANAGEMENT 2.5 MG/0.5ML ~~LOC~~ SOLN: 2.5000 mg | SUBCUTANEOUS | 1 refills | Status: DC

## 2023-06-24 NOTE — Assessment & Plan Note (Signed)
 Patient is interested in weight loss options, her current BMI is 36.67 and she is pre-diabetic. Past labs have been reviewed.   -tirzepatide (Zepbound) 2.4 mg subcutaneous once weekly injections. Demonstration provided along with written educational materials.  -follow up in 2 months.

## 2023-06-24 NOTE — Progress Notes (Signed)
   Office Visit  Subjective   Patient ID: Deborah Gibbs   DOB: [birth year]   Age: 33 y.o.   MRN: [id number]   Chief Complaint Chief Complaint  Patient presents with   Follow-up    Follow up      History of Present Illness HPI    Deborah Gibbs is a 34 year old female who comes in to discuss options for weight loss. Her weight began to increase in 2021 so she began working with [facility] in  2022, she was at heaviest weight of 215 lbs. She received phentermine and hcg/B-12 injections, she was able to loose 15 lbs. Unfortunately her insurance changed and she could not afford increased costs of the program so she stopped. In 2024 she underwent back surgery, she says her depression led to emotional/stress eating and she was unable exercise due to limitations and pain. Nexplanon implant for contraception.   Her goal weight is 145-150 lbs. She has started meal prepping, increasing intake of vegetables and fruits. Drinking more water and limiting sodas to 2-3 times a week and is attempting 10k steps daily.     Wt Readings from Last 5 Encounters:  06/24/23 207 lb (93.9 kg)  06/03/23 204 lb (92.5 kg)  05/08/23 203 lb 6 oz (92.3 kg)  04/01/23 202 lb (91.6 kg)  03/19/23 203 lb 4 oz (92.2 kg)     Review of Systems Review of Systems  All other systems reviewed and are negative.      Objective:    Vitals BP 118/72 (BP Location: Left Arm, Patient Position: Sitting, Cuff Size: Normal)   Pulse 77   Temp 98 F (36.7 C)   Resp 18   Ht 5\' 3"  (1.6 m)   Wt 207 lb (93.9 kg)   SpO2 99%   BMI 36.67 kg/m    Physical Examination Physical Exam Vitals reviewed.  Constitutional:      Appearance: Normal appearance. She is obese.  Cardiovascular:     Rate and Rhythm: Normal rate and regular rhythm.     Pulses: Normal pulses.     Heart sounds: Normal heart sounds.  Pulmonary:     Effort: Pulmonary effort is normal.     Breath sounds: Normal breath sounds.   Abdominal:     General: Abdomen is flat. Bowel sounds are normal.     Palpations: Abdomen is soft.  Neurological:     General: No focal deficit present.     Mental Status: She is alert and oriented to person, place, and time.  Psychiatric:        Mood and Affect: Mood normal.        Behavior: Behavior normal.        Assessment & Plan:   Obesity (BMI 35.0-39.9 without comorbidity) Patient is interested in weight loss options, her current BMI is 36.67 and she is pre-diabetic. Past labs have been reviewed.   -tirzepatide (Zepbound) 2.4 mg subcutaneous once weekly injections. Demonstration provided along with written educational materials.  -follow up in 2 months.     Please be aware that some insurances do not cover weight loss related medications.  Some insurance may cover weight loss medications but not in its entirety.  Return for follow up for weight loss in May .   Deborah Blow, NP

## 2023-06-25 ENCOUNTER — Encounter: Payer: Self-pay | Admitting: Student

## 2023-06-26 ENCOUNTER — Other Ambulatory Visit: Payer: Self-pay | Admitting: Student

## 2023-06-26 MED ORDER — EPINEPHRINE 0.3 MG/0.3ML IJ SOAJ: 0.3000 mg | INTRAMUSCULAR | 1 refills | Status: AC | PRN

## 2023-06-26 MED ORDER — PHENTERMINE HCL 37.5 MG PO CAPS: 37.5000 mg | ORAL_CAPSULE | ORAL | 2 refills | Status: DC

## 2023-06-26 NOTE — Progress Notes (Signed)
 Patient was denied Tirzepatide. She will begin Phentermine 37.5 mg once daily.

## 2023-07-24 ENCOUNTER — Other Ambulatory Visit: Payer: Self-pay | Admitting: Student

## 2023-07-24 DIAGNOSIS — M5416 Radiculopathy, lumbar region: Secondary | ICD-10-CM

## 2023-07-24 MED ORDER — OXYCODONE HCL 5 MG PO TABS: 5.0000 mg | ORAL_TABLET | ORAL | 0 refills | Status: DC | PRN

## 2023-08-21 ENCOUNTER — Encounter: Payer: Self-pay | Admitting: Student

## 2023-08-21 ENCOUNTER — Ambulatory Visit: Admitting: Student

## 2023-08-21 VITALS — BP 108/76 | HR 95 | Temp 98.5°F | Resp 18 | Ht 63.0 in | Wt 207.6 lb

## 2023-08-21 DIAGNOSIS — F411 Generalized anxiety disorder: Secondary | ICD-10-CM

## 2023-08-21 DIAGNOSIS — F332 Major depressive disorder, recurrent severe without psychotic features: Secondary | ICD-10-CM

## 2023-08-21 DIAGNOSIS — F329 Major depressive disorder, single episode, unspecified: Secondary | ICD-10-CM | POA: Diagnosis not present

## 2023-08-21 DIAGNOSIS — M5416 Radiculopathy, lumbar region: Secondary | ICD-10-CM

## 2023-08-21 DIAGNOSIS — E669 Obesity, unspecified: Secondary | ICD-10-CM

## 2023-08-21 DIAGNOSIS — Z79899 Other long term (current) drug therapy: Secondary | ICD-10-CM

## 2023-08-21 DIAGNOSIS — Z5181 Encounter for therapeutic drug level monitoring: Secondary | ICD-10-CM

## 2023-08-21 DIAGNOSIS — M545 Low back pain, unspecified: Secondary | ICD-10-CM

## 2023-08-21 LAB — POCT URINE DRUG SCREEN
Methylenedioxyamphetamine: NOT DETECTED
POC Amphetamine UR: NOT DETECTED
POC BENZODIAZEPINES UR: NOT DETECTED
POC Barbiturate UR: NOT DETECTED
POC Cocaine UR: NOT DETECTED
POC Ecstasy UR: NOT DETECTED
POC Marijuana UR: POSITIVE — AB
POC Methadone UR: NOT DETECTED
POC Methamphetamine UR: NOT DETECTED
POC Opiate Ur: NOT DETECTED
POC Oxycodone UR: NOT DETECTED
POC PHENCYCLIDINE UR: NOT DETECTED
POC TRICYCLICS UR: NOT DETECTED

## 2023-08-21 MED ORDER — FLUOXETINE HCL 20 MG PO CAPS: 20.0000 mg | ORAL_CAPSULE | Freq: Every day | ORAL | 2 refills | Status: DC

## 2023-08-21 NOTE — Assessment & Plan Note (Signed)
 The patient's back pain has been adequately controlled, only while us  a recent fall was managed with oxycodone , ibuprofen , and Flexeril . The patient experienced improvement over a few days.

## 2023-08-21 NOTE — Assessment & Plan Note (Signed)
 The patient continued to have difficulties with weight loss despite using Phentermine . This may be influenced by the use of Prozac , known for potential weight gain.  PLAN:  Treatment: - Continued Phentermine  37.5 mg daily  Patient Education: - Encouraged exploring low-calorie sweets to manage sugar cravings and portion control. - Advised on potential weight gain associated with Prozac 

## 2023-08-21 NOTE — Progress Notes (Unsigned)
 Established Patient Office Visit  Subjective   Patient ID: Deborah Gibbs, female    DOB: 1990-12-21  Age: 33 y.o. MRN: [id number]  Chief Complaint  Patient presents with   Follow-up    3 Month F/U    HPI  Deborah Gibbs is a 33 year old female patient that returns   The patient has history lumbar radiculopathy and has undergone spinal fusion.  ofthe patient experienced a fall on April 16th, resulting in back pain localized to the midline and was briefly incapacitated, requiring increased doses of oxycodone  for pain management. The patient was able to walk with a limp after a few days and reported residual leg pain. The patient has been managing lumbar radiculopathy with Flexeril , ibuprofen , and oxycodone , and acknowledged using THC gummies to reduce use of narcotic use and stress relief. The patient endorses not using pain medications with gummies. Her last dose of oxycodone  was in April with injury.   The patient reported that the combination of Prozac  and Vistaril  has been effective for depression and anxiety management, with no recent anxiety attacks. She notes a significant improvement in anxiety where she is now able to tolerate public places and events with her children.  Today she denies feelings of suicidal ideation, homicidal ideation, worthlessness, helplessness, hypersomnia, or illicit drug use.   The patient presented for a follow-up regarding weight management and lumbar radiculopathy. Since the last visit on March 17th, the patient had been using Phentermine  for weight loss, initially experiencing a decrease in appetite. However, the patient noted that the medication's effectiveness had diminished over time, though they continued to take it daily. The patient reported a heaviest weight of 215 lbs and a current weight of 207 lbs, with a goal of losing 5 to 6 pounds per month. The patient expressed interest in exploring low-calorie sweets to curb sweet cravings.  Review of Systems   All other systems reviewed and are negative.     Objective:     BP 108/76   Pulse 95   Temp 98.5 F (36.9 C) (Oral)   Resp 18   Ht 5\' 3"  (1.6 m)   Wt 207 lb 9.6 oz (94.2 kg)   SpO2 98%   BMI 36.77 kg/m  {Vitals History (Optional):23777}  Physical Exam Vitals reviewed.  Constitutional:      General: She is not in acute distress.    Appearance: Normal appearance. She is obese. She is not ill-appearing.  Cardiovascular:     Rate and Rhythm: Normal rate and regular rhythm.     Pulses: Normal pulses.     Heart sounds: Normal heart sounds. No murmur heard. Pulmonary:     Effort: Pulmonary effort is normal. No respiratory distress.     Breath sounds: Normal breath sounds. No wheezing or rhonchi.  Chest:     Chest wall: No tenderness.  Abdominal:     General: Bowel sounds are normal. There is no distension.     Palpations: Abdomen is soft.  Musculoskeletal:        General: No swelling or deformity. Normal range of motion.     Right lower leg: No edema.     Left lower leg: No edema.  Skin:    General: Skin is warm and dry.     Capillary Refill: Capillary refill takes less than 2 seconds.  Neurological:     General: No focal deficit present.     Mental Status: She is alert and oriented to person, place, and  time. Mental status is at baseline.  Psychiatric:        Mood and Affect: Mood normal.        Behavior: Behavior normal.        Thought Content: Thought content normal.        Judgment: Judgment normal.      Results for orders placed or performed in visit on 08/21/23  POCT Urine drug screen  Result Value Ref Range   POC Methamphetamine UR None Detected None Detected   POC Opiate Ur None Detected None Detected   POC Barbiturate UR None Detected None Detected   POC Amphetamine UR None Detected None Detected   POC Oxycodone  UR None Detected None Detected   POC Cocaine UR None Detected None Detected   POC Ecstasy UR None Detected None Detected   POC TRICYCLICS  UR None Detected None Detected   POC PHENCYCLIDINE UR None Detected None Detected   POC Marijuana UR Positive (A) None Detected   POC Methadone UR None Detected None Detected   POC BENZODIAZEPINES UR None Detected None Detected   URINE TEMPERATURE     POC DRUG SCREEN OXIDANTS URINE     POC SPECIFIC GRAVITY URINE     POC PH URINE     Methylenedioxyamphetamine None Detected None Detected    {Labs (Optional):23779}  The ASCVD Risk score (Arnett DK, et al., 2019) failed to calculate for the following reasons:   The 2019 ASCVD risk score is only valid for ages 26 to 30    Assessment & Plan:   Problem List Items Addressed This Visit     Obesity (BMI 35.0-39.9 without comorbidity)   GAD (generalized anxiety disorder)   Relevant Medications   FLUoxetine  (PROZAC ) 20 MG capsule   Severe episode of recurrent major depressive disorder, without psychotic features (HCC)   Relevant Medications   FLUoxetine  (PROZAC ) 20 MG capsule   Low back pain - Primary   Relevant Orders   POCT Urine drug screen (Completed)   Other Visit Diagnoses       Major depressive disorder, remission status unspecified, unspecified whether recurrent       Relevant Medications   FLUoxetine  (PROZAC ) 20 MG capsule       No follow-ups on file.    Nasario Badder, NP

## 2023-08-22 NOTE — Assessment & Plan Note (Signed)
 The patient reported significant improvement in mood and anxiety symptoms with the current regimen of Prozac  and Vistaril .  PLAN:  Treatment: - Continued Prozac  20 mg once daily and Vistaril  50 mg as needed.  Tests: - None specified  Patient Education: - Encouraged exploring low-calorie sweets to manage sugar cravings - Advised on potential weight gain associated with Prozac   Follow-Up: - Follow-up in three months to reassess weight management and depression

## 2023-08-22 NOTE — Assessment & Plan Note (Signed)
 Same as above

## 2023-08-23 ENCOUNTER — Ambulatory Visit: Admitting: Internal Medicine

## 2023-08-23 ENCOUNTER — Encounter: Payer: Self-pay | Admitting: Internal Medicine

## 2023-08-23 VITALS — BP 118/70 | HR 70 | Temp 97.8°F | Resp 18 | Ht 63.0 in | Wt 205.1 lb

## 2023-08-23 DIAGNOSIS — H60501 Unspecified acute noninfective otitis externa, right ear: Secondary | ICD-10-CM | POA: Diagnosis not present

## 2023-08-23 DIAGNOSIS — G500 Trigeminal neuralgia: Secondary | ICD-10-CM | POA: Diagnosis not present

## 2023-08-23 MED ORDER — CIPROFLOXACIN-DEXAMETHASONE 0.3-0.1 % OT SUSP: 4.0000 [drp] | Freq: Two times a day (BID) | OTIC | 0 refills | Status: DC

## 2023-08-23 MED ORDER — CARBAMAZEPINE ER 100 MG PO TB12: 100.0000 mg | ORAL_TABLET | Freq: Two times a day (BID) | ORAL | 2 refills | Status: DC

## 2023-08-23 NOTE — Assessment & Plan Note (Signed)
 I believe she has trigeminal neuralgia as her right side face is tender to to touch and very sensitive. I will start her on Tegretal 100 mg twice a day and the re-evaluate

## 2023-08-23 NOTE — Assessment & Plan Note (Signed)
 She will put ciprodex  4 drops to right ear twice a day for 7 days

## 2023-08-23 NOTE — Progress Notes (Signed)
   Acute Office Visit  Subjective:     Patient ID: Deborah Gibbs, female    DOB: 1990-12-10, 33 y.o.   MRN: [id number]  Chief Complaint  Patient presents with   Office visit    Pain in left and right ear and feels like they may have fluid in them.    HPI Patient is in today for pain on right side of face radiation to right jaw, right ear. She says it is very uncomfortable feeling for 1 month. She was given steroid and it did not help. She also noted very sensitive right ear and right side face. Pain radiate to her teeth also. She says that she has noted mild drainage from right ear. No fever or chills. No post nasal drip. No cough. No seasonal allergies.   Review of Systems  HENT:  Positive for ear pain.        Right facial pain  Respiratory: Negative.    Cardiovascular: Negative.         Objective:    BP 118/70 (BP Location: Left Arm, Patient Position: Sitting, Cuff Size: Normal)   Pulse 70   Temp 97.8 F (36.6 C)   Resp 18   Ht 5\' 3"  (1.6 m)   Wt 205 lb 2 oz (93 kg)   SpO2 98%   BMI 36.34 kg/m    Physical Exam Constitutional:      Appearance: Normal appearance.  HENT:     Head:     Comments: Very sensitive right side face, mild irritation right ear cannal. Neurological:     Mental Status: She is alert.     No results found for any visits on 08/23/23.      Assessment & Plan:   Problem List Items Addressed This Visit       Nervous and Auditory   Acute otitis externa of right ear - Primary   She will put ciprodex  4 drops to right ear twice a day for 7 days       Trigeminal neuralgia of right side of face   I believe she has trigeminal neuralgia as her right side face is tender to to touch and very sensitive. I will start her on Tegretal 100 mg twice a day and the re-evaluate      Relevant Medications   carbamazepine (TEGRETOL-XR) 100 MG 12 hr tablet    No orders of the defined types were placed in this encounter.   Return in about 2 weeks  (around 09/06/2023).  Tita Form, MD

## 2023-08-28 ENCOUNTER — Ambulatory Visit: Payer: Commercial Managed Care - HMO | Admitting: Student

## 2023-09-06 ENCOUNTER — Ambulatory Visit: Admitting: Internal Medicine

## 2023-09-09 ENCOUNTER — Encounter (HOSPITAL_BASED_OUTPATIENT_CLINIC_OR_DEPARTMENT_OTHER): Payer: Self-pay | Admitting: Emergency Medicine

## 2023-09-09 ENCOUNTER — Emergency Department (HOSPITAL_BASED_OUTPATIENT_CLINIC_OR_DEPARTMENT_OTHER)
Admission: EM | Admit: 2023-09-09 | Discharge: 2023-09-10 | Disposition: A | Discharge status: Home / Self Care | Attending: Emergency Medicine | Admitting: Emergency Medicine

## 2023-09-09 ENCOUNTER — Other Ambulatory Visit: Payer: Self-pay

## 2023-09-09 DIAGNOSIS — M5416 Radiculopathy, lumbar region: Secondary | ICD-10-CM | POA: Diagnosis not present

## 2023-09-09 DIAGNOSIS — M545 Low back pain, unspecified: Secondary | ICD-10-CM | POA: Diagnosis present

## 2023-09-09 MED ORDER — ONDANSETRON 4 MG PO TBDP
4.0000 mg | ORAL_TABLET | Freq: Once | ORAL | Status: AC
  Administered 2023-09-09: 4 mg via ORAL
  Filled 2023-09-09: qty 1

## 2023-09-09 MED ORDER — FENTANYL CITRATE PF 50 MCG/ML IJ SOSY
50.0000 ug | PREFILLED_SYRINGE | INTRAMUSCULAR | Status: DC | PRN
  Administered 2023-09-09: 50 ug via NASAL
  Filled 2023-09-09: qty 1

## 2023-09-09 NOTE — ED Triage Notes (Signed)
 Pt POV staggered gait- reports ongoing lower back pain x7 years,  had back surgery June 2024.  Pt with worsening back pain starting this AM appx 0900, new numbness starting after that. Pt able to wiggle toes when asked.   Denies urinary complaints.   Pt with emesis in triage due to pain. Tried to take pain meds earlier today, but unable to keep anything down.

## 2023-09-10 ENCOUNTER — Ambulatory Visit: Admitting: Internal Medicine

## 2023-09-10 MED ORDER — METHOCARBAMOL 500 MG PO TABS: 500.0000 mg | ORAL_TABLET | Freq: Two times a day (BID) | ORAL | 0 refills | Status: DC

## 2023-09-10 MED ORDER — PREDNISONE 50 MG PO TABS
60.0000 mg | ORAL_TABLET | Freq: Once | ORAL | Status: AC
  Administered 2023-09-10: 60 mg via ORAL
  Filled 2023-09-10: qty 1

## 2023-09-10 MED ORDER — OXYCODONE-ACETAMINOPHEN 5-325 MG PO TABS: 1.0000 | ORAL_TABLET | Freq: Four times a day (QID) | ORAL | 0 refills | Status: DC | PRN

## 2023-09-10 MED ORDER — PREDNISONE 10 MG (21) PO TBPK: ORAL_TABLET | ORAL | 0 refills | Status: DC

## 2023-09-10 MED ORDER — METHOCARBAMOL 500 MG PO TABS
500.0000 mg | ORAL_TABLET | Freq: Once | ORAL | Status: AC
  Administered 2023-09-10: 500 mg via ORAL
  Filled 2023-09-10: qty 1

## 2023-09-10 MED ORDER — HYDROMORPHONE HCL 1 MG/ML IJ SOLN
2.0000 mg | Freq: Once | INTRAMUSCULAR | Status: AC
  Administered 2023-09-10: 2 mg via INTRAMUSCULAR
  Filled 2023-09-10: qty 2

## 2023-09-10 NOTE — ED Provider Notes (Signed)
 [facility]  Provider Note  CSN: [id number] Arrival date & time: 09/09/23 2118  History Chief Complaint  Patient presents with   Back Pain    Deborah Gibbs is a 33 y.o. female with history of lumbar radiculopathy and prior lumbar fusion reports continued symptoms off and on for several months. Has been to PCP and given Rx for oxycodone  but currently she is out. She reports right lower back pain, radiating down her R leg. She has tingling in that leg which is chronic for her. No difficulty moving the leg. No bowel or bladder concerns. She apparently called someone who told her she could come here for an MRI.    Home Medications Prior to Admission medications   Medication Sig Start Date End Date Taking? Authorizing Provider  methocarbamol (ROBAXIN) 500 MG tablet Take 1 tablet (500 mg total) by mouth 2 (two) times daily. 09/10/23  Yes Charmayne Cooper, MD  oxyCODONE -acetaminophen  (PERCOCET/ROXICET) 5-325 MG tablet Take 1 tablet by mouth every 6 (six) hours as needed for severe pain (pain score 7-10). 09/10/23  Yes Charmayne Cooper, MD  predniSONE  (STERAPRED UNI-PAK 21 TAB) 10 MG (21) TBPK tablet 10mg  Tabs, 6 day taper. Use as directed 09/10/23  Yes Charmayne Cooper, MD  Acetaminophen  (TYLENOL  PO) Take 1 capsule by mouth every 6 (six) hours as needed.    [provider]  carbamazepine  (TEGRETOL -XR) 100 MG 12 hr tablet Take 1 tablet (100 mg total) by mouth 2 (two) times daily. 08/23/23 08/22/24  Amin, Saad, MD  ciprofloxacin -dexamethasone  (CIPRODEX ) OTIC suspension Place 4 drops into the left ear 2 (two) times daily. 08/23/23   Amin, Saad, MD  EPINEPHrine  (EPIPEN  2-PAK) 0.3 mg/0.3 mL IJ SOAJ injection Inject 0.3 mg into the muscle as needed for anaphylaxis. 06/26/23   Leak, Brandi L, NP  FLUoxetine  (PROZAC ) 20 MG capsule Take 1 capsule (20 mg total) by mouth daily. 08/21/23   Leak, Brandi L, NP  hydrOXYzine  (VISTARIL ) 50 MG capsule TAKE 2 CAPSULES  (100 MG TOTAL) BY MOUTH 3 (THREE) TIMES DAILY AS NEEDED FOR ANXIETY. 06/10/23   Leak, Brandi L, NP  ibuprofen  (ADVIL ) 600 MG tablet Take 1 tablet (600 mg total) by mouth every 6 (six) hours as needed for moderate pain (pain score 4-6). 06/03/23   Leak, Brandi L, NP  ondansetron  (ZOFRAN -ODT) 4 MG disintegrating tablet Take 1 tablet (4 mg total) by mouth every 8 (eight) hours as needed for nausea or vomiting. 03/19/23   Leak, Brandi L, NP  oxyCODONE  (OXY IR/ROXICODONE ) 5 MG immediate release tablet Take 1 tablet (5 mg total) by mouth every 4 (four) hours as needed for severe pain (pain score 7-10). 07/24/23   Leak, Brandi L, NP  phentermine  37.5 MG capsule Take 1 capsule (37.5 mg total) by mouth every morning. 06/26/23   Leak, Brandi L, NP     Allergies    Amoxicillin, Pepcid  [famotidine ], Bee venom, Diphenhydramine  hcl, Meloxicam, and Penicillins   Review of Systems   Review of Systems Please see HPI for pertinent positives and negatives  Physical Exam BP (!) 142/98   Pulse 73   Temp 99.1 F (37.3 C) (Oral)   Resp 15   Ht 5\' 3"  (1.6 m)   Wt 89.8 kg   SpO2 99%   BMI 35.07 kg/m   Physical Exam Vitals and nursing note reviewed.  Constitutional:      Appearance: Normal appearance.     Comments: uncomfortable  HENT:  Head: Normocephalic and atraumatic.     Nose: Nose normal.     Mouth/Throat:     Mouth: Mucous membranes are moist.  Eyes:     Extraocular Movements: Extraocular movements intact.     Conjunctiva/sclera: Conjunctivae normal.  Cardiovascular:     Rate and Rhythm: Normal rate.  Pulmonary:     Effort: Pulmonary effort is normal.     Breath sounds: Normal breath sounds.  Abdominal:     General: Abdomen is flat.     Palpations: Abdomen is soft.     Tenderness: There is no abdominal tenderness.  Musculoskeletal:        General: Tenderness (R lumbar paraspinal muscles, sciatic notch) present. No swelling. Normal range of motion.     Cervical back: Neck supple.   Skin:    General: Skin is warm and dry.  Neurological:     General: No focal deficit present.     Mental Status: She is alert.     Sensory: Sensory deficit (decreased sensation RLE) present.     Motor: No weakness.     Deep Tendon Reflexes: Reflexes normal.  Psychiatric:        Mood and Affect: Mood normal.     ED Results / Procedures / Treatments   EKG None  Procedures Procedures  Medications Ordered in the ED Medications  fentaNYL  (SUBLIMAZE ) injection 50 mcg (50 mcg Nasal Given 09/09/23 2141)  ondansetron  (ZOFRAN -ODT) disintegrating tablet 4 mg (4 mg Oral Given 09/09/23 2141)  HYDROmorphone  (DILAUDID ) injection 2 mg (2 mg Intramuscular Given 09/10/23 0118)  methocarbamol (ROBAXIN) tablet 500 mg (500 mg Oral Given 09/10/23 0113)  predniSONE  (DELTASONE ) tablet 60 mg (60 mg Oral Given 09/10/23 0113)    Initial Impression and Plan  Patient here with exacerbation of chronic lumbar radiculopathy. She does not have any red flags, unfortunately, she was under the impression she could get an MRI here which is not currently available. Will give additional pain medications, steroids and muscle relaxer to attempt to control her pain here.   ED Course   Clinical Course as of 09/10/23 0203  Tue Sep 10, 2023  0200 Patient reports pain is improved and she is ready to go home. Plan Rx for pain medications, steroids and muscle relaxer. Recommend outpatient follow up with her spine surgeon for re-assessment. PCP Follow up for long term pain management. RTED for any other concerns.  [CS]    Clinical Course User Index [CS] Charmayne Cooper, MD     MDM Rules/Calculators/A&P Medical Decision Making Problems Addressed: Lumbar radiculopathy: chronic illness or injury with exacerbation, progression, or side effects of treatment  Risk Prescription drug management. Parenteral controlled substances.     Final Clinical Impression(s) / ED Diagnoses Final diagnoses:  Lumbar radiculopathy     Rx / DC Orders ED Discharge Orders          Ordered    oxyCODONE -acetaminophen  (PERCOCET/ROXICET) 5-325 MG tablet  Every 6 hours PRN        09/10/23 0202    methocarbamol (ROBAXIN) 500 MG tablet  2 times daily        09/10/23 0202    predniSONE  (STERAPRED UNI-PAK 21 TAB) 10 MG (21) TBPK tablet        09/10/23 0202             Charmayne Cooper, MD 09/10/23 [phone]

## 2023-09-30 ENCOUNTER — Ambulatory Visit: Admitting: Internal Medicine

## 2023-09-30 ENCOUNTER — Encounter: Payer: Self-pay | Admitting: Internal Medicine

## 2023-09-30 VITALS — BP 124/80 | HR 83 | Temp 98.3°F | Resp 18 | Ht 63.0 in | Wt 204.0 lb

## 2023-09-30 DIAGNOSIS — M5416 Radiculopathy, lumbar region: Secondary | ICD-10-CM

## 2023-09-30 DIAGNOSIS — E669 Obesity, unspecified: Secondary | ICD-10-CM

## 2023-09-30 DIAGNOSIS — G500 Trigeminal neuralgia: Secondary | ICD-10-CM

## 2023-09-30 MED ORDER — OXYCODONE HCL 5 MG PO TABS: 5.0000 mg | ORAL_TABLET | Freq: Three times a day (TID) | ORAL | 0 refills | Status: DC | PRN

## 2023-09-30 MED ORDER — PREGABALIN 75 MG PO CAPS: 75.0000 mg | ORAL_CAPSULE | Freq: Three times a day (TID) | ORAL | 2 refills | Status: DC

## 2023-09-30 MED ORDER — DULOXETINE HCL 30 MG PO CPEP: 30.0000 mg | ORAL_CAPSULE | Freq: Every day | ORAL | 3 refills | Status: DC

## 2023-09-30 NOTE — Progress Notes (Unsigned)
   Office Visit  Subjective   Patient ID: Deborah Gibbs   DOB: 1990-08-30   Age: 33 y.o.   MRN: [id number]   Chief Complaint Chief Complaint  Patient presents with   Medication Refill    Follow up     History of Present Illness   Past Medical History Past Medical History:  Diagnosis Date   Abortion    Allergy    Anxiety    Back pain    Depression    Fibroid    Food allergy    SOBOE (shortness of breath on exertion)      Allergies Allergies  Allergen Reactions   Amoxicillin Shortness Of Breath and Itching   Pepcid  [Famotidine ] Hives   Bee Venom    Diphenhydramine  Hcl Hives, Swelling and Other (See Comments)    Reaction:  Facial/hand swelling   Meloxicam Hives, Swelling and Other (See Comments)    Reaction:  Facial/hand swelling  Pt has tolerated ibuprofen  in the past without reaction   Penicillins Hives and Other (See Comments)    Has patient had a PCN reaction causing immediate rash, facial/tongue/throat swelling, SOB or lightheadedness with hypotension: No Has patient had a PCN reaction causing severe rash involving mucus membranes or skin necrosis: No Has patient had a PCN reaction that required hospitalization No Has patient had a PCN reaction occurring within the last 10 years: Yes If all of the above answers are NO, then may proceed with Cephalosporin use.      Review of Systems Review of Systems  Constitutional: Negative.   Respiratory: Negative.    Cardiovascular: Negative.   Musculoskeletal:  Positive for back pain.       Objective:    Vitals BP 124/80 (BP Location: Left Arm, Patient Position: Sitting, Cuff Size: Normal)   Pulse 83   Temp 98.3 F (36.8 C)   Resp 18   Ht 5' 3 (1.6 m)   Wt 204 lb (92.5 kg)   SpO2 99%   BMI 36.14 kg/m    Physical Examination Physical Exam Constitutional:      Appearance: Normal appearance.  HENT:     Head: Normocephalic and atraumatic.   Cardiovascular:     Rate and Rhythm: Normal rate and regular  rhythm.  Pulmonary:     Effort: Pulmonary effort is normal.     Breath sounds: Normal breath sounds.  Abdominal:     Palpations: Abdomen is soft.   Neurological:     General: No focal deficit present.     Mental Status: She is alert and oriented to person, place, and time.        Assessment & Plan:   No problem-specific Assessment & Plan notes found for this encounter.    Return in about 1 month (around 10/30/2023).   Roetta Dare, MD

## 2023-10-02 NOTE — Assessment & Plan Note (Signed)
 I will add duloxetine for chronic pain and will send refill of oxycodone .  I have suggested to go back to see the surgeon who has done surgery but she does not wanted to go there.  I will refer her to see Dr. Renato here.

## 2023-10-02 NOTE — Assessment & Plan Note (Signed)
 She will cut down portion of her meal and is taking phentermine  for weight loss.  Will monitor.

## 2023-10-02 NOTE — Assessment & Plan Note (Signed)
 It is better with Tegretol .

## 2023-10-25 ENCOUNTER — Ambulatory Visit: Admitting: Internal Medicine

## 2023-10-25 ENCOUNTER — Encounter: Payer: Self-pay | Admitting: Internal Medicine

## 2023-10-25 VITALS — BP 122/80 | HR 76 | Temp 97.8°F | Resp 18 | Ht 63.0 in | Wt 197.4 lb

## 2023-10-25 DIAGNOSIS — M5416 Radiculopathy, lumbar region: Secondary | ICD-10-CM

## 2023-10-25 DIAGNOSIS — Z6834 Body mass index (BMI) 34.0-34.9, adult: Secondary | ICD-10-CM

## 2023-10-25 DIAGNOSIS — F411 Generalized anxiety disorder: Secondary | ICD-10-CM

## 2023-10-25 DIAGNOSIS — G8929 Other chronic pain: Secondary | ICD-10-CM | POA: Diagnosis not present

## 2023-10-25 DIAGNOSIS — M5441 Lumbago with sciatica, right side: Secondary | ICD-10-CM | POA: Diagnosis not present

## 2023-10-25 MED ORDER — PHENTERMINE HCL 37.5 MG PO CAPS: 37.5000 mg | ORAL_CAPSULE | ORAL | 2 refills | Status: DC

## 2023-10-25 NOTE — Assessment & Plan Note (Signed)
 She has lost 7 lb and there is no side effect from phentermine .  I will send refill of her pain medication.

## 2023-10-25 NOTE — Progress Notes (Addendum)
 Office Visit  Subjective   Patient ID: Deborah Gibbs   DOB: 1990/10/01   Age: 33 y.o.   MRN: [id number]   Chief Complaint Chief Complaint  Patient presents with   Obesity    1 month follow up     History of Present Illness  33 years old female is here for follow-up. She says that she started having feeling in her legs that was gone after surgery. She is taking pregabalin  75 mg 3 times a day and that is helping and she still occasionally use oxycodone  when the pain flares up.Deborah Gibbs  She wanted to see spine surgeon here because it is difficult for her to go to [place].  I have made referral here but she says that she has not heard back from them. She has back pain with right leg radiculopathy and underwent surgery done in [place]    She is also obese female  Who is trying to lose weight.  She has lost 7 lb since last visit and her BMI is 34. I have started her on phentermine  and she denies any side effect.  Her blood pressure and heart rate is good.  She says that she is watching her diet..  She is using birth control and understand that she cannot take phentermine  being pregnant.    She says that her anxiety is much better.  She takes duloxetine  30 mg daily.  She also take hydroxyzine  as needed.     She says that trigeminal neurology has resolved and she stopped using Tegretol .  Past Medical History Past Medical History:  Diagnosis Date   Abortion    Allergy    Anxiety    Back pain    Depression    Fibroid    Food allergy    SOBOE (shortness of breath on exertion)      Allergies Allergies  Allergen Reactions   Amoxicillin Shortness Of Breath and Itching   Pepcid  [Famotidine ] Hives   Bee Venom    Diphenhydramine  Hcl Hives, Swelling and Other (See Comments)    Reaction:  Facial/hand swelling   Meloxicam Hives, Swelling and Other (See Comments)    Reaction:  Facial/hand swelling  Pt has tolerated ibuprofen  in the past without reaction   Penicillins Hives and Other (See  Comments)    Has patient had a PCN reaction causing immediate rash, facial/tongue/throat swelling, SOB or lightheadedness with hypotension: No Has patient had a PCN reaction causing severe rash involving mucus membranes or skin necrosis: No Has patient had a PCN reaction that required hospitalization No Has patient had a PCN reaction occurring within the last 10 years: Yes If all of the above answers are NO, then may proceed with Cephalosporin use.      Review of Systems Review of Systems  Constitutional: Negative.   Respiratory: Negative.    Cardiovascular: Negative.   Gastrointestinal: Negative.   Musculoskeletal:  Positive for back pain.       Objective:    Vitals BP 122/80   Pulse 76   Temp 97.8 F (36.6 C)   Resp 18   Ht 5' 3 (1.6 m)   Wt 197 lb 6 oz (89.5 kg)   LMP  (LMP Unknown)   SpO2 98%   BMI 34.96 kg/m    Physical Examination Physical Exam Constitutional:      Appearance: Normal appearance. She is obese.  Cardiovascular:     Rate and Rhythm: Normal rate and regular rhythm.     Heart sounds:  Normal heart sounds.  Pulmonary:     Effort: Pulmonary effort is normal.     Breath sounds: Normal breath sounds.  Abdominal:     General: Bowel sounds are normal.     Palpations: Abdomen is soft.  Neurological:     General: No focal deficit present.     Mental Status: She is alert and oriented to person, place, and time.        Assessment & Plan:   Right lumbar radiculopathy   It is better after surgery.  She wanted to see spine surgeon here for opinion and I will let us  see Dr. Renato here.  GAD (generalized anxiety disorder)   It is better with duloxetine .  No side effects.  BMI 34.0-34.9,adult   She has lost 7 lb and there is no side effect from phentermine .  I will send refill of her pain medication.    Return in about 2 months (around 12/26/2023).   Roetta Dare, MD

## 2023-10-25 NOTE — Assessment & Plan Note (Signed)
 It is better after surgery.  She wanted to see spine surgeon here for opinion and I will let us  see Dr. Renato here.

## 2023-10-25 NOTE — Assessment & Plan Note (Signed)
 It is better with duloxetine .  No side effects.

## 2023-11-22 ENCOUNTER — Ambulatory Visit: Admitting: Internal Medicine

## 2023-11-28 NOTE — Progress Notes (Deleted)
 [facility] [address] [place] [address] Dept: [phone] Dept Fax: [phone]  New Patient Office Visit  Subjective:   Deborah Gibbs 1990/05/07 11/29/2023  No chief complaint on file.   HPI: Deborah Gibbs presents today to establish care at [facility]. Introduced to Publishing rights manager role and practice setting.  All questions answered.  Concerns: See below    The following portions of the patient's history were reviewed and updated as appropriate: past medical history, past surgical history, family history, social history, allergies, medications, and problem list.   Patient Active Problem List   Diagnosis Date Noted   Acute otitis externa of right ear 08/23/2023   Trigeminal neuralgia of right side of face 08/23/2023   BMI 34.0-34.9,adult 06/24/2023   Subacute frontal sinusitis 05/08/2023   Otitis media 05/08/2023   Encounter for annual physical exam 03/19/2023   Intractable nausea 03/19/2023   SOBOE (shortness of breath on exertion) 04/21/2020   Insomnia 04/21/2020   Vitamin D  deficiency 04/21/2020   At risk for impaired metabolic function 04/21/2020   GAD (generalized anxiety disorder) 03/10/2020   Severe episode of recurrent major depressive disorder, without psychotic features (HCC) 04/07/2019   Low back pain 06/06/2016   Displacement of lumbar intervertebral disc without myelopathy 06/06/2016   Right lumbar radiculopathy 06/06/2016   RhD negative 06/28/2015   Past Medical History:  Diagnosis Date   Abortion    Allergy    Anxiety    Back pain    Depression    Fibroid    Food allergy    SOBOE (shortness of breath on exertion)    Past Surgical History:  Procedure Laterality Date   WISDOM TOOTH EXTRACTION     Family History  Problem Relation Age of Onset   Hypertension Father    Depression Father    Anxiety disorder Father    Cancer Maternal Grandmother         Breast Cancer   Hypertension Mother    Depression Mother    Anxiety disorder Mother    Bipolar disorder Mother    Alcoholism Mother    Drug abuse Mother    Cancer Brother        neuroblastoma    Current Outpatient Medications:    Acetaminophen  (TYLENOL  PO), Take 1 capsule by mouth every 6 (six) hours as needed., Disp: , Rfl:    carbamazepine  (TEGRETOL -XR) 100 MG 12 hr tablet, Take 1 tablet (100 mg total) by mouth 2 (two) times daily., Disp: 60 tablet, Rfl: 2   ciprofloxacin -dexamethasone  (CIPRODEX ) OTIC suspension, Place 4 drops into the left ear 2 (two) times daily., Disp: 7.5 mL, Rfl: 0   DULoxetine  (CYMBALTA ) 30 MG capsule, Take 1 capsule (30 mg total) by mouth daily., Disp: 30 capsule, Rfl: 3   EPINEPHrine  (EPIPEN  2-PAK) 0.3 mg/0.3 mL IJ SOAJ injection, Inject 0.3 mg into the muscle as needed for anaphylaxis., Disp: 1.32 each, Rfl: 1   hydrOXYzine  (VISTARIL ) 50 MG capsule, TAKE 2 CAPSULES (100 MG TOTAL) BY MOUTH 3 (THREE) TIMES DAILY AS NEEDED FOR ANXIETY., Disp: 540 capsule, Rfl: 1   ibuprofen  (ADVIL ) 600 MG tablet, Take 1 tablet (600 mg total) by mouth every 6 (six) hours as needed for moderate pain (pain score 4-6)., Disp: 30 tablet, Rfl: 1   methocarbamol  (ROBAXIN ) 500 MG tablet, Take 1 tablet (500 mg total) by mouth 2 (two) times daily., Disp: 20 tablet, Rfl: 0   ondansetron  (ZOFRAN -ODT) 4 MG disintegrating  tablet, Take 1 tablet (4 mg total) by mouth every 8 (eight) hours as needed for nausea or vomiting., Disp: 20 tablet, Rfl: 0   oxyCODONE  (OXY IR/ROXICODONE ) 5 MG immediate release tablet, Take 1 tablet (5 mg total) by mouth every 8 (eight) hours as needed for severe pain (pain score 7-10)., Disp: 90 tablet, Rfl: 0   oxyCODONE -acetaminophen  (PERCOCET/ROXICET) 5-325 MG tablet, Take 1 tablet by mouth every 6 (six) hours as needed for severe pain (pain score 7-10)., Disp: 15 tablet, Rfl: 0   phentermine  37.5 MG capsule, Take 1 capsule (37.5 mg total) by mouth every morning., Disp: 30  capsule, Rfl: 2   predniSONE  (STERAPRED UNI-PAK 21 TAB) 10 MG (21) TBPK tablet, 10mg  Tabs, 6 day taper. Use as directed, Disp: 1 each, Rfl: 0   pregabalin  (LYRICA ) 75 MG capsule, Take 1 capsule (75 mg total) by mouth 3 (three) times daily., Disp: 90 capsule, Rfl: 2 Allergies  Allergen Reactions   Amoxicillin Shortness Of Breath and Itching   Pepcid  [Famotidine ] Hives   Bee Venom    Diphenhydramine  Hcl Hives, Swelling and Other (See Comments)    Reaction:  Facial/hand swelling   Meloxicam Hives, Swelling and Other (See Comments)    Reaction:  Facial/hand swelling  Pt has tolerated ibuprofen  in the past without reaction   Penicillins Hives and Other (See Comments)    Has patient had a PCN reaction causing immediate rash, facial/tongue/throat swelling, SOB or lightheadedness with hypotension: No Has patient had a PCN reaction causing severe rash involving mucus membranes or skin necrosis: No Has patient had a PCN reaction that required hospitalization No Has patient had a PCN reaction occurring within the last 10 years: Yes If all of the above answers are NO, then may proceed with Cephalosporin use.     ROS: A complete ROS was performed with pertinent positives/negatives noted in the HPI. The remainder of the ROS are negative.   Objective:   There were no vitals filed for this visit.  GENERAL: Well-appearing, in NAD. Well nourished.  SKIN: Pink, warm and dry. No rash, lesion, ulceration, or ecchymoses.  NECK: Trachea midline. Full ROM w/o pain or tenderness. No lymphadenopathy.  RESPIRATORY: Chest wall symmetrical. Respirations even and non-labored. Breath sounds clear to auscultation bilaterally.  CARDIAC: S1, S2 present, regular rate and rhythm. Peripheral pulses 2+ bilaterally.  EXTREMITIES: Without clubbing, cyanosis, or edema.  NEUROLOGIC: No motor or sensory deficits. Steady, even gait.  PSYCH/MENTAL STATUS: Alert, oriented x 3. Cooperative, appropriate mood and affect.    Health Maintenance Due  Topic Date Due   COVID-19 Vaccine (1) Never done   Hepatitis B Vaccines 19-59 Average Risk (1 of 3 - 19+ 3-dose series) Never done   HPV VACCINES (1 - 3-dose SCDM series) Never done   INFLUENZA VACCINE  11/08/2023    No results found for any visits on 11/29/23.  Assessment & Plan:   There are no diagnoses linked to this encounter.  No orders of the defined types were placed in this encounter.  No orders of the defined types were placed in this encounter.   No follow-ups on file.   Rosina Senters, FNP

## 2023-11-29 ENCOUNTER — Ambulatory Visit: Admitting: Internal Medicine

## 2023-12-13 ENCOUNTER — Ambulatory Visit: Admitting: Internal Medicine

## 2023-12-13 ENCOUNTER — Encounter: Payer: Self-pay | Admitting: Internal Medicine

## 2023-12-13 VITALS — BP 124/80 | HR 78 | Temp 97.9°F | Resp 18 | Ht 63.0 in | Wt 199.4 lb

## 2023-12-13 DIAGNOSIS — H5712 Ocular pain, left eye: Secondary | ICD-10-CM | POA: Diagnosis not present

## 2023-12-13 NOTE — Telephone Encounter (Signed)
 [place] given to dr amin

## 2023-12-13 NOTE — Progress Notes (Signed)
   Acute Office Visit  Subjective:     Patient ID: Deborah Gibbs, female    DOB: 04-27-1990, 33 y.o.   MRN: [id number]  Chief Complaint  Patient presents with   Left eye swollen    Left eye swollen and red    HPI Patient is in today for painful swollen left eye for 2 days.  Pain is worse today.  She has a blurry vision.  There is no discharge.  She says that touching  below her eyes feel pressure in her eyes.  Light also bother her.  She put drip clear drops this morning that cause burning pain.  She does not use contact lenses.  She follow-up with eye doctor in [place] once a year.  Review of Systems  Eyes:  Positive for blurred vision and pain.        Objective:    BP 124/80 (BP Location: Left Arm, Patient Position: Sitting, Cuff Size: Normal)   Pulse 78   Temp 97.9 F (36.6 C)   Resp 18   Ht 5' 3 (1.6 m)   Wt 199 lb 6 oz (90.4 kg)   SpO2 99%   BMI 35.32 kg/m    Physical Exam Constitutional:      Appearance: Normal appearance.  Eyes:     Comments:   Her life eyelids are slightly swollen, blurry vision and painful movement of left eye.  Neurological:     General: No focal deficit present.     Mental Status: She is alert and oriented to person, place, and time.     No results found for any visits on 12/13/23.      Assessment & Plan:   Problem List Items Addressed This Visit       Other   Pain in left eye - Primary     She has painful blurry vision and photophobia in left eye.  No drainage and no redness.  I have spoken with her Eye doctor Office and they will see her this afternoon.        No orders of the defined types were placed in this encounter.   No follow-ups on file.  Roetta Dare, MD

## 2023-12-13 NOTE — Assessment & Plan Note (Signed)
 She has painful blurry vision and photophobia in left eye.  No drainage and no redness.  I have spoken with her Eye doctor Office and they will see her this afternoon.

## 2023-12-20 ENCOUNTER — Ambulatory Visit: Admitting: Internal Medicine

## 2023-12-25 ENCOUNTER — Ambulatory Visit: Admitting: Internal Medicine

## 2023-12-27 ENCOUNTER — Ambulatory Visit: Admitting: Internal Medicine

## 2024-01-13 ENCOUNTER — Ambulatory Visit: Admitting: Internal Medicine

## 2024-01-24 ENCOUNTER — Ambulatory Visit: Admitting: Internal Medicine

## 2024-02-24 ENCOUNTER — Ambulatory Visit: Admitting: Internal Medicine

## 2024-04-29 ENCOUNTER — Ambulatory Visit: Payer: MEDICAID | Admitting: Internal Medicine

## 2024-04-29 ENCOUNTER — Inpatient Hospital Stay (HOSPITAL_COMMUNITY): Payer: MEDICAID

## 2024-04-29 ENCOUNTER — Inpatient Hospital Stay (HOSPITAL_COMMUNITY)
Admission: AD | Admit: 2024-04-29 | Discharge: 2024-05-01 | DRG: 832 | Disposition: A | Discharge status: Home / Self Care | Payer: MEDICAID | Attending: Internal Medicine | Admitting: Internal Medicine

## 2024-04-29 ENCOUNTER — Other Ambulatory Visit: Payer: Self-pay

## 2024-04-29 ENCOUNTER — Encounter (HOSPITAL_COMMUNITY): Payer: Self-pay | Admitting: Obstetrics and Gynecology

## 2024-04-29 DIAGNOSIS — O9A219 Injury, poisoning and certain other consequences of external causes complicating pregnancy, unspecified trimester: Principal | ICD-10-CM

## 2024-04-29 DIAGNOSIS — W1839XA Other fall on same level, initial encounter: Secondary | ICD-10-CM | POA: Diagnosis present

## 2024-04-29 DIAGNOSIS — R8271 Bacteriuria: Secondary | ICD-10-CM

## 2024-04-29 DIAGNOSIS — S0990XA Unspecified injury of head, initial encounter: Secondary | ICD-10-CM

## 2024-04-29 DIAGNOSIS — Z23 Encounter for immunization: Secondary | ICD-10-CM

## 2024-04-29 DIAGNOSIS — O9A212 Injury, poisoning and certain other consequences of external causes complicating pregnancy, second trimester: Secondary | ICD-10-CM | POA: Diagnosis present

## 2024-04-29 DIAGNOSIS — M542 Cervicalgia: Secondary | ICD-10-CM | POA: Diagnosis present

## 2024-04-29 DIAGNOSIS — R55 Syncope and collapse: Secondary | ICD-10-CM | POA: Diagnosis present

## 2024-04-29 DIAGNOSIS — Y92002 Bathroom of unspecified non-institutional (private) residence single-family (private) house as the place of occurrence of the external cause: Secondary | ICD-10-CM

## 2024-04-29 DIAGNOSIS — Z79899 Other long term (current) drug therapy: Secondary | ICD-10-CM

## 2024-04-29 DIAGNOSIS — R4020 Unspecified coma: Secondary | ICD-10-CM

## 2024-04-29 DIAGNOSIS — Z8249 Family history of ischemic heart disease and other diseases of the circulatory system: Secondary | ICD-10-CM

## 2024-04-29 DIAGNOSIS — S060XAA Concussion with loss of consciousness status unknown, initial encounter: Secondary | ICD-10-CM | POA: Diagnosis present

## 2024-04-29 DIAGNOSIS — O99282 Endocrine, nutritional and metabolic diseases complicating pregnancy, second trimester: Secondary | ICD-10-CM | POA: Diagnosis present

## 2024-04-29 DIAGNOSIS — O99891 Other specified diseases and conditions complicating pregnancy: Principal | ICD-10-CM | POA: Diagnosis present

## 2024-04-29 DIAGNOSIS — Z3A16 16 weeks gestation of pregnancy: Secondary | ICD-10-CM

## 2024-04-29 LAB — URINALYSIS, W/ REFLEX TO CULTURE (INFECTION SUSPECTED)
Bilirubin Urine: NEGATIVE
Glucose, UA: NEGATIVE mg/dL
Hgb urine dipstick: NEGATIVE
Ketones, ur: 5 mg/dL — AB
Leukocytes,Ua: NEGATIVE
Nitrite: NEGATIVE
Protein, ur: NEGATIVE mg/dL
Specific Gravity, Urine: 1.004 — ABNORMAL LOW (ref 1.005–1.030)
pH: 7 (ref 5.0–8.0)

## 2024-04-29 LAB — COMPREHENSIVE METABOLIC PANEL WITH GFR
ALT: 24 U/L (ref 0–44)
AST: 38 U/L (ref 15–41)
Albumin: 3.9 g/dL (ref 3.5–5.0)
Alkaline Phosphatase: 33 U/L — ABNORMAL LOW (ref 38–126)
Anion gap: 13 (ref 5–15)
BUN: <5 mg/dL — ABNORMAL LOW (ref 6–20)
CO2: 20 mmol/L — ABNORMAL LOW (ref 22–32)
Calcium: 9.4 mg/dL (ref 8.9–10.3)
Chloride: 102 mmol/L (ref 98–111)
Creatinine, Ser: 0.46 mg/dL (ref 0.44–1.00)
GFR, Estimated: >60 mL/min
Glucose, Bld: 84 mg/dL (ref 70–99)
Potassium: 3.8 mmol/L (ref 3.5–5.1)
Sodium: 135 mmol/L (ref 135–145)
Total Bilirubin: 0.3 mg/dL (ref 0.0–1.2)
Total Protein: 6.6 g/dL (ref 6.5–8.1)

## 2024-04-29 LAB — CBC WITH DIFFERENTIAL/PLATELET
Abs Immature Granulocytes: 0.16 K/uL — ABNORMAL HIGH (ref 0.00–0.07)
Basophils Absolute: 0.1 K/uL (ref 0.0–0.1)
Basophils Relative: 1 %
Eosinophils Absolute: 0.2 K/uL (ref 0.0–0.5)
Eosinophils Relative: 2 %
HCT: 34.8 % — ABNORMAL LOW (ref 36.0–46.0)
Hemoglobin: 11.7 g/dL — ABNORMAL LOW (ref 12.0–15.0)
Immature Granulocytes: 1 %
Lymphocytes Relative: 26 %
Lymphs Abs: 3.4 K/uL (ref 0.7–4.0)
MCH: 29.8 pg (ref 26.0–34.0)
MCHC: 33.6 g/dL (ref 30.0–36.0)
MCV: 88.5 fL (ref 80.0–100.0)
Monocytes Absolute: 0.9 K/uL (ref 0.1–1.0)
Monocytes Relative: 7 %
Neutro Abs: 8.4 K/uL — ABNORMAL HIGH (ref 1.7–7.7)
Neutrophils Relative %: 63 %
Platelets: 241 K/uL (ref 150–400)
RBC: 3.93 MIL/uL (ref 3.87–5.11)
RDW: 12.6 % (ref 11.5–15.5)
WBC: 13.1 K/uL — ABNORMAL HIGH (ref 4.0–10.5)
nRBC: 0 % (ref 0.0–0.2)

## 2024-04-29 LAB — PRO BRAIN NATRIURETIC PEPTIDE: Pro Brain Natriuretic Peptide: <50 pg/mL

## 2024-04-29 LAB — TROPONIN T, HIGH SENSITIVITY: Troponin T High Sensitivity: 9 ng/L (ref 0–19)

## 2024-04-29 LAB — TSH: TSH: 1.08 u[IU]/mL (ref 0.350–4.500)

## 2024-04-29 LAB — MAGNESIUM: Magnesium: 1.9 mg/dL (ref 1.7–2.4)

## 2024-04-29 MED ORDER — SODIUM CHLORIDE 0.9 % IV SOLN
25.0000 mg | Freq: Once | INTRAVENOUS | Status: AC
  Administered 2024-04-29: 25 mg via INTRAVENOUS
  Filled 2024-04-29: qty 1

## 2024-04-29 MED ORDER — ACETAMINOPHEN 325 MG PO TABS
650.0000 mg | ORAL_TABLET | Freq: Once | ORAL | Status: AC
  Administered 2024-04-29: 650 mg via ORAL
  Filled 2024-04-29: qty 2

## 2024-04-29 MED ORDER — SODIUM CHLORIDE 0.9 % IV BOLUS
1000.0000 mL | Freq: Once | INTRAVENOUS | Status: AC
  Administered 2024-04-29: 1000 mL via INTRAVENOUS

## 2024-04-29 NOTE — MAU Note (Signed)
 Notified ED charge nurse of pt being transferred to dept . Dr. Emil accepting physician

## 2024-04-29 NOTE — ED Provider Notes (Addendum)
 " [place] EMERGENCY DEPARTMENT AT [facility] Provider Note   CSN: [id number] Arrival date & time: 04/29/24  1352     Patient presents with: Fall and Head Injury   Deborah Gibbs is a 34 y.o. female.   The history is provided by the patient, medical records and a significant other. No language interpreter was used.  Head Injury Location:  Generalized Time since incident:  2 days Mechanism of injury: fall   Pain details:    Quality:  Aching and dull   Severity:  Severe   Duration:  2 days (smoldering for 2 weeks or so)   Timing:  Constant   Progression:  Worsening Associated symptoms: blurred vision, double vision, headache, loss of consciousness and neck pain   Associated symptoms: no nausea, no numbness, no seizures, no tinnitus and no vomiting        Prior to Admission medications  Medication Sig Start Date End Date Taking? Authorizing Provider  Acetaminophen  (TYLENOL  PO) Take 1 capsule by mouth every 6 (six) hours as needed.    [provider]  carbamazepine  (TEGRETOL -XR) 100 MG 12 hr tablet Take 1 tablet (100 mg total) by mouth 2 (two) times daily. 08/23/23 08/22/24  Amin, Saad, MD  ciprofloxacin -dexamethasone  (CIPRODEX ) OTIC suspension Place 4 drops into the left ear 2 (two) times daily. 08/23/23   Amin, Saad, MD  DULoxetine  (CYMBALTA ) 30 MG capsule Take 1 capsule (30 mg total) by mouth daily. 09/30/23   Amin, Saad, MD  EPINEPHrine  (EPIPEN  2-PAK) 0.3 mg/0.3 mL IJ SOAJ injection Inject 0.3 mg into the muscle as needed for anaphylaxis. 06/26/23   Jerilynn Daphne LOISE, NP  hydrOXYzine  (VISTARIL ) 50 MG capsule TAKE 2 CAPSULES (100 MG TOTAL) BY MOUTH 3 (THREE) TIMES DAILY AS NEEDED FOR ANXIETY. 06/10/23   Jerilynn Daphne LOISE, NP  ibuprofen  (ADVIL ) 600 MG tablet Take 1 tablet (600 mg total) by mouth every 6 (six) hours as needed for moderate pain (pain score 4-6). 06/03/23   Jerilynn Daphne LOISE, NP  methocarbamol  (ROBAXIN ) 500 MG tablet Take 1 tablet (500 mg total) by  mouth 2 (two) times daily. 09/10/23   Roselyn Carlin NOVAK, MD  ondansetron  (ZOFRAN -ODT) 4 MG disintegrating tablet Take 1 tablet (4 mg total) by mouth every 8 (eight) hours as needed for nausea or vomiting. 03/19/23   Jerilynn Daphne LOISE, NP  oxyCODONE  (OXY IR/ROXICODONE ) 5 MG immediate release tablet Take 1 tablet (5 mg total) by mouth every 8 (eight) hours as needed for severe pain (pain score 7-10). 09/30/23   Amin, Saad, MD  oxyCODONE -acetaminophen  (PERCOCET/ROXICET) 5-325 MG tablet Take 1 tablet by mouth every 6 (six) hours as needed for severe pain (pain score 7-10). 09/10/23   Roselyn Carlin NOVAK, MD  phentermine  37.5 MG capsule Take 1 capsule (37.5 mg total) by mouth every morning. 10/25/23   Amin, Saad, MD  predniSONE  (STERAPRED UNI-PAK 21 TAB) 10 MG (21) TBPK tablet 10mg  Tabs, 6 day taper. Use as directed 09/10/23   Roselyn Carlin NOVAK, MD  pregabalin  (LYRICA ) 75 MG capsule Take 1 capsule (75 mg total) by mouth 3 (three) times daily. 09/30/23 09/29/24  Amin, Saad, MD    Allergies: Amoxicillin, Pepcid  [famotidine ], Bee venom, Diphenhydramine  hcl, Meloxicam, and Penicillins    Review of Systems  Constitutional:  Negative for chills, fatigue and fever.  HENT:  Negative for congestion and tinnitus.   Eyes:  Positive for blurred vision, double vision, photophobia and visual disturbance. Negative for pain.  Respiratory:  Negative for cough,  chest tightness, shortness of breath and wheezing.   Cardiovascular:  Negative for chest pain, palpitations and leg swelling.  Gastrointestinal:  Negative for abdominal pain, constipation, diarrhea, nausea and vomiting.  Genitourinary:  Negative for dysuria, flank pain, vaginal bleeding, vaginal discharge and vaginal pain.  Musculoskeletal:  Positive for neck pain. Negative for back pain.  Skin:  Negative for rash and wound.  Neurological:  Positive for dizziness, loss of consciousness, syncope, light-headedness and headaches. Negative for seizures, facial asymmetry,  speech difficulty, weakness and numbness.  Psychiatric/Behavioral:  Negative for agitation and confusion.   All other systems reviewed and are negative.   Updated Vital Signs BP 106/61   Pulse 75   Temp 98.9 F (37.2 C)   Resp 17   LMP  (LMP Unknown)   SpO2 100%   Physical Exam Vitals and nursing note reviewed.  Constitutional:      General: She is not in acute distress.    Appearance: She is well-developed. She is not ill-appearing or diaphoretic.  HENT:     Head: Normocephalic and atraumatic.     Nose: No congestion or rhinorrhea.     Mouth/Throat:     Pharynx: No oropharyngeal exudate or posterior oropharyngeal erythema.  Eyes:     Conjunctiva/sclera: Conjunctivae normal.     Pupils: Pupils are equal, round, and reactive to light.  Cardiovascular:     Rate and Rhythm: Normal rate and regular rhythm.     Heart sounds: No murmur heard. Pulmonary:     Effort: Pulmonary effort is normal. No respiratory distress.     Breath sounds: Normal breath sounds. No wheezing, rhonchi or rales.  Chest:     Chest wall: No tenderness.  Abdominal:     General: Abdomen is flat.     Palpations: Abdomen is soft.     Tenderness: There is no abdominal tenderness. There is no right CVA tenderness, left CVA tenderness, guarding or rebound.  Musculoskeletal:        General: Tenderness present. No swelling.     Cervical back: Neck supple.     Right lower leg: No edema.     Left lower leg: No edema.  Skin:    General: Skin is warm and dry.     Capillary Refill: Capillary refill takes less than 2 seconds.     Findings: No erythema or rash.  Neurological:     Mental Status: She is alert.     Cranial Nerves: No dysarthria or facial asymmetry.     Sensory: No sensory deficit.     Motor: No tremor, abnormal muscle tone or seizure activity.     Coordination: Finger-Nose-Finger Test normal.     Comments: Patient reporting double vision out of right eye and blurriness out of both eyes.  Denies  significant eye pain at this time.  No rash seen on face.  Psychiatric:        Mood and Affect: Mood normal.     (all labs ordered are listed, but only abnormal results are displayed) Labs Reviewed  CBC WITH DIFFERENTIAL/PLATELET - Abnormal; Notable for the following components:      Result Value   WBC 13.1 (*)    Hemoglobin 11.7 (*)    HCT 34.8 (*)    Neutro Abs 8.4 (*)    Abs Immature Granulocytes 0.16 (*)    All other components within normal limits  COMPREHENSIVE METABOLIC PANEL WITH GFR - Abnormal; Notable for the following components:   CO2 20 (*)  BUN <5 (*)    Alkaline Phosphatase 33 (*)    All other components within normal limits  URINALYSIS, W/ REFLEX TO CULTURE (INFECTION SUSPECTED) - Abnormal; Notable for the following components:   APPearance HAZY (*)    Specific Gravity, Urine 1.004 (*)    Ketones, ur 5 (*)    Bacteria, UA RARE (*)    All other components within normal limits  URINE CULTURE  TSH  MAGNESIUM  PRO BRAIN NATRIURETIC PEPTIDE  TROPONIN T, HIGH SENSITIVITY    EKG: EKG Interpretation Date/Time:  Wednesday April 29 2024 16:32:37 EST Ventricular Rate:  75 PR Interval:  160 QRS Duration:  82 QT Interval:  399 QTC Calculation: 446 R Axis:   52  Text Interpretation: Sinus rhythm when compared to prior, similar appearance No STEMI Confirmed by Ginger Barefoot ([id number]) on 04/29/2024 8:50:40 PM  Radiology: MR Cervical Spine Wo Contrast Result Date: 04/29/2024 EXAM: MRI CERVICAL SPINE WITHOUT CONTRAST 04/29/2024 07:04:00 PM TECHNIQUE: Multiplanar multisequence MRI of the cervical spine was performed. COMPARISON: None available. CLINICAL HISTORY: Neck trauma dizziness, headache, neck pain, vision changes with right eye double vision and significant headache. Syncope. Neurology recommended MRI brain, MRV head, and MR C-spine without contrast. FINDINGS: BONES AND ALIGNMENT: Normal alignment. Normal vertebral body heights. Bone marrow signal is  unremarkable. SPINAL CORD: Normal spinal cord size. No abnormal spinal cord signal. SOFT TISSUES: No paraspinal mass. C2-C3: No significant disc herniation. No spinal canal stenosis or neural foraminal narrowing. C3-C4: No significant disc herniation. No spinal canal stenosis or neural foraminal narrowing. C4-C5: No significant disc herniation. Left uncovertebral hypertrophy. No spinal canal stenosis or neural foraminal narrowing. C5-C6: Small posterior disc osteophyte complex. No spinal canal stenosis or neural foraminal narrowing. C6-C7: Small posterior disc osteophyte complex. No spinal canal stenosis or neural foraminal narrowing. C7-T1: No significant disc herniation. No spinal canal stenosis or neural foraminal narrowing. IMPRESSION: 1. No spinal canal stenosis or neural foraminal narrowing in the cervical spine. Electronically signed by: Glendia Molt MD 04/29/2024 07:18 PM EST RP Workstation: [id number]   MR MRV HEAD WO CM Result Date: 04/29/2024 EXAM: MRV BRAIN 04/29/2024 07:04:00 PM TECHNIQUE: Multiplanar multisequence MRV of the head was performed without the administration of intravenous contrast. Multiplanar 2D and 3D reformatted images are provided for review. COMPARISON: CT Head 04/21/17 CLINICAL HISTORY: Neck trauma dizziness, headache, neck pain, vision changes with right eye double vision and significant headache. Syncope. FINDINGS: No focal stenosis or thrombus is seen of the major dural venous sinuses. IMPRESSION: 1. Unremarkable MRV of the head. Electronically signed by: Glendia Molt MD 04/29/2024 07:15 PM EST RP Workstation: [id number]   MR BRAIN WO CONTRAST Result Date: 04/29/2024 EXAM: MRI BRAIN WITHOUT CONTRAST 04/29/2024 07:04:00 PM TECHNIQUE: Multiplanar multisequence MRI of the head/brain was performed without the administration of intravenous contrast. COMPARISON: None available. CLINICAL HISTORY: Neck trauma dizziness, headache, neck pain, vision changes with right eye double vision  and significant headache. Syncope. FINDINGS: BRAIN AND VENTRICLES: No acute infarct. No intracranial hemorrhage. No mass. No midline shift. No hydrocephalus. The sella is unremarkable. Normal flow voids. ORBITS: No significant abnormality. SINUSES AND MASTOIDS: No significant abnormality. BONES AND SOFT TISSUES: Normal marrow signal. No soft tissue abnormality. IMPRESSION: 1. No acute abnormality. Electronically signed by: Glendia Molt MD 04/29/2024 07:12 PM EST RP Workstation: [id number]     Procedures   Medications Ordered in the ED  sodium chloride  0.9 % bolus 1,000 mL (1,000 mLs Intravenous New Bag/Given 04/29/24 2118)  acetaminophen  (TYLENOL ) tablet 650 mg (  650 mg Oral Given 04/29/24 1654)  promethazine  (PHENERGAN ) 25 mg in sodium chloride  0.9 % 50 mL IVPB (0 mg Intravenous Stopped 04/29/24 2018)  sodium chloride  0.9 % bolus 1,000 mL (1,000 mLs Intravenous New Bag/Given 04/29/24 2218)                                    Medical Decision Making Amount and/or Complexity of Data Reviewed Labs: ordered. Radiology: ordered.  Risk OTC drugs. Decision regarding hospitalization.    Deborah Gibbs is a 34 y.o. female who is 16 weeks 6 days pregnant with other past medical history significant for previous trigeminal neuralgia and previous low back surgery who presents from the MAU for further evaluation of dizziness, headaches, vision changes, and syncope.  According to patient, she has had no complications with this pregnancy but over the last few weeks she has had episodes of lightheadedness and dizziness and difficulty walking at times.  She is also had a smoldering headache that she does not normally have for the last few weeks that waxes and wanes.  She says that her last few days it has been much worse and acutely worsened the night before last when she went to the bathroom woke up on the ground after falling and hitting her head.  She does not member the fall and does not remember any preceding  symptoms such as chest pain, palpitations, shortness of breath.  She says that she did have a near syncopal episode a week or so ago also in the bathroom at night when she had to lay down but did not actually pass out.  She says that since waking up on the floor yesterday morning, she has had much worsened headache and neck pain that she describes as 10 out of 10 has also had some vision changes.  She reports both eyes seem blurry but her right eye she is seeing double out of.  This is new.  She is never had vision changes.  She reports no significant symptoms from the neck down.  She denies recent fevers, chills, congestion, cough, nausea, vomiting, constipation, diarrhea, or urinary changes.  Denies any abdominal pain.  Denies any vaginal discharge or vaginal bleeding.  She denies any other trauma.  She has no pain in her chest back or abdomen or extremities.  On my exam, she is in a c-collar and is complaining of tenderness and pain in her neck.  When she covered her left eye she reported double vision horizontally in her single right eyes vision.  Left eye she did not have diplopia but reported blurry vision in both eyes.  Symmetric smile.  Clear speech.   she had some mild nystagmus in both eyes for me.  Lungs clear.  Chest nontender.  Back nontender.  Abdomen nontender.  Intact sensation strength and pulse in extremities.  Normal finger-nose-finger testing aside from missing my finger due to the double vision with her right eye.   Due to the neurologic findings and her headache and syncope and trauma, I spoke to neurology who recommended MRI of the C-spine, MRI of the brain, and MRV of the head to rule out dural venous sinus thrombus, stroke, or other acute abnormality.  He recommended avoiding CT or any contrast either with CT or MRI contrast at this time.  We will get some screening labs and cardiac workup given the syncope as well and make sure she  is not dehydrated.  Will give some fluids.   Neurology did recommend a headache cocktail and I spoke to pharmacy recommended Phenergan  and Tylenol  as she has a Benadryl  allergy.  Anticipate reassessment after medications, fluids, and imaging and labs.  7:30 PM MRI imaging of the head and neck returned completely reassuring.  Will reassess patient to determine disposition.  8:33 PM Patient tells the headache is gone from an 10 to an 8.  Blood pressure still in the 90s systolic and she still feels lightheaded.  Will wait for the labs to return but given her persistent symptoms of the lightheadedness, still having some of the vision changes despite the reassuring MRI, patient is concerned about going home.  Will likely call for admission for soft pressures, syncope and near syncope as well as likely complex migraine.  On reassessment she still has some headaches but has slightly improved.  She still has the vision changes but is denying eye pain itself.  Denies any trauma to her eyes.  We had a discussion about checking pressures for glaucoma or staining them to look for scratches but she has low suspicion for eye injury so we will hold on that at this time.  10:53 PM Patient's labs returned showing similar leukocytosis to prior.  Mild anemia that does not seem significant.  She has no leukocytes or bacteria but only has rare bacteria in the urine without any urinary symptoms and there were endothelial cells so suspect that is more contaminant.  Culture will be sent.Other otherwise her labs did not show critical abnormality however patient still has blood pressure around 100, still feels lightheaded, and still is having the headache symptoms.  She still does not feel safe going home given the recent near syncopal and then a syncopal episode yesterday.  Due to the syncope that is somewhat atypical and her persistent soft pressures, despite 2 L of fluid, will call for medical admission.     Final diagnoses:  Trauma during pregnancy  Recent head  trauma, initial encounter  LOC (loss of consciousness) (HCC)  [redacted] weeks gestation of pregnancy    Clinical Impression: 1. Trauma during pregnancy   2. Recent head trauma, initial encounter   3. LOC (loss of consciousness) (HCC)   4. [redacted] weeks gestation of pregnancy     Disposition: Admit  This note was prepared with assistance of Conservation officer, historic buildings. Occasional wrong-word or sound-a-like substitutions may have occurred due to the inherent limitations of voice recognition software.       Kaliope Quinonez, Lonni PARAS, MD 04/29/24 2333    Jaia Alonge, Lonni PARAS, MD 04/29/24 2348  "

## 2024-04-29 NOTE — ED Notes (Signed)
 Patient transported to MRI

## 2024-04-29 NOTE — MAU Provider Note (Signed)
 CC/  S/P Head trauma and LOC on 04/29/23.    S/HPI [name] is a 34 y.o. [id number] patient who presents to MAU today with complaint of  Head Trauma.  Patient found on bathroom floor by 34 year old. LOC for unknown amount of time. Patient reports unawre of what or how she fell and hit her head.   Patient reports when  I opened my eyes  I told to call 911 She was brought by EMS to [facility].  Patient states  They did not perform imaging  Said I was pregnant and gave me Tylenol  and IVF  Patient reports  I was sent home   Now patient presents with blurry vision, contusions visible on her right eye. She reports severe HA, Blurry Vision and difficulty seeing. Also c/o severe Neck pain. Overall pain score is 10/10 on pain scale.   She denies any OB C/O and offers no c/o VB, LOF, Cramping.     OBJECTIVE BP 99/67   Pulse 79   Temp 98.9 F (37.2 C)   Resp 18   LMP  (LMP Unknown)  Physical Exam Vitals and nursing note reviewed.  Constitutional:      Appearance: She is obese.  Cardiovascular:     Rate and Rhythm: Normal rate.  Pulmonary:     Effort: Pulmonary effort is normal.  Abdominal:     Palpations: Abdomen is soft.     Tenderness: There is no abdominal tenderness.  Neurological:     Mental Status: She is alert and oriented to person, place, and time.     FHR via doppler 147  MDM   LOW -> ED for Trauma evaluation and LOC   I spoke with Dr Emil in ED and charge nurse made aware of patient status and need for trauma evaluation and CT scan. I offered to place the CT scan order in to expedite. Per ED Attending -requested that patient be evaluated by Triage RN in ED first prior to imaging.    ASSESSMENT/PLAN Medical screening exam complete  Trauma during pregnancy Direct head trauma on 04/28/2024 with LOC  Recent head trauma, initial encounter Patient reports being seen at [facility] on 04/28/2024 with no imaging after a direct hide head  trauma and LOC  LOC (loss of consciousness) (HCC)  [redacted] weeks gestation of pregnancy FHR at 147 No ob complaints    Transferred to ED via Technician HDS with no OB complaints  after appropriate calls made to ED provider and Charge RN. Dr Emil accepting ED provider     Littie Olam LABOR, NP 04/29/2024 3:02 PM

## 2024-04-29 NOTE — MAU Note (Signed)
 Deborah Gibbs is a 34 y.o. at Unknown here in MAU reporting: yesterday stated she had felt a little dizzy . Remembered heading to the BR the next thing she knew she was on the floor. Know she hit th right side of hr head on the toilet or bathtub. Did call 911 and was brought to [facility] but they did not do any imaging because she was pregnant.  They gave her tylenol  and sent her home. Today she is c/o headache and neck pain has dizziness and blurred vision on the right. Called OB and told her to come here.  Did take more tylenol  this morning for head ache   LMP:  Onset of complaint: yesterday morning Pain score: 10 There were no vitals filed for this visit.   FHT: 147  Lab orders placed from triage:

## 2024-04-29 NOTE — ED Triage Notes (Signed)
 Pt arrives to ED from MAU, she is [redacted] weeks pregnant. PT reports yesterday she woke up on the floor. She remembers going to the restroom but doesn't remember passing out. She thinks she may have hit her head on the tub. PT c/o pain to head and neck. Cervical tenderness noted. PT reports double and blurred vision out of right eye and just blurred vision in left eye and dizziness. She is AxOx4. C-collar applied. Had 1000mg  of tylenol  around 1000.

## 2024-04-30 ENCOUNTER — Observation Stay (HOSPITAL_COMMUNITY): Payer: MEDICAID

## 2024-04-30 DIAGNOSIS — Z8249 Family history of ischemic heart disease and other diseases of the circulatory system: Secondary | ICD-10-CM | POA: Diagnosis not present

## 2024-04-30 DIAGNOSIS — W1839XA Other fall on same level, initial encounter: Secondary | ICD-10-CM | POA: Diagnosis present

## 2024-04-30 DIAGNOSIS — M542 Cervicalgia: Secondary | ICD-10-CM | POA: Diagnosis present

## 2024-04-30 DIAGNOSIS — O99891 Other specified diseases and conditions complicating pregnancy: Secondary | ICD-10-CM | POA: Diagnosis present

## 2024-04-30 DIAGNOSIS — Y92002 Bathroom of unspecified non-institutional (private) residence single-family (private) house as the place of occurrence of the external cause: Secondary | ICD-10-CM | POA: Diagnosis not present

## 2024-04-30 DIAGNOSIS — O9A212 Injury, poisoning and certain other consequences of external causes complicating pregnancy, second trimester: Secondary | ICD-10-CM | POA: Diagnosis present

## 2024-04-30 DIAGNOSIS — Z3A16 16 weeks gestation of pregnancy: Secondary | ICD-10-CM | POA: Diagnosis not present

## 2024-04-30 DIAGNOSIS — R55 Syncope and collapse: Secondary | ICD-10-CM

## 2024-04-30 DIAGNOSIS — S060XAA Concussion with loss of consciousness status unknown, initial encounter: Secondary | ICD-10-CM | POA: Diagnosis present

## 2024-04-30 DIAGNOSIS — Z23 Encounter for immunization: Secondary | ICD-10-CM | POA: Diagnosis not present

## 2024-04-30 DIAGNOSIS — Z79899 Other long term (current) drug therapy: Secondary | ICD-10-CM | POA: Diagnosis not present

## 2024-04-30 DIAGNOSIS — O99282 Endocrine, nutritional and metabolic diseases complicating pregnancy, second trimester: Secondary | ICD-10-CM | POA: Diagnosis present

## 2024-04-30 LAB — CBC
HCT: 30.9 % — ABNORMAL LOW (ref 36.0–46.0)
Hemoglobin: 10.6 g/dL — ABNORMAL LOW (ref 12.0–15.0)
MCH: 29.9 pg (ref 26.0–34.0)
MCHC: 34.3 g/dL (ref 30.0–36.0)
MCV: 87 fL (ref 80.0–100.0)
Platelets: 215 K/uL (ref 150–400)
RBC: 3.55 MIL/uL — ABNORMAL LOW (ref 3.87–5.11)
RDW: 12.7 % (ref 11.5–15.5)
WBC: 11.3 K/uL — ABNORMAL HIGH (ref 4.0–10.5)
nRBC: 0 % (ref 0.0–0.2)

## 2024-04-30 LAB — CORTISOL: Cortisol, Plasma: 10.5 ug/dL

## 2024-04-30 LAB — ECHOCARDIOGRAM COMPLETE
Area-P 1/2: 3.15 cm2
Calc EF: 62.8 %
S' Lateral: 2.8 cm
Single Plane A2C EF: 60.5 %
Single Plane A4C EF: 64 %

## 2024-04-30 LAB — COMPREHENSIVE METABOLIC PANEL WITH GFR
ALT: 20 U/L (ref 0–44)
AST: 24 U/L (ref 15–41)
Albumin: 3.4 g/dL — ABNORMAL LOW (ref 3.5–5.0)
Alkaline Phosphatase: 27 U/L — ABNORMAL LOW (ref 38–126)
Anion gap: 8 (ref 5–15)
BUN: <5 mg/dL — ABNORMAL LOW (ref 6–20)
CO2: 20 mmol/L — ABNORMAL LOW (ref 22–32)
Calcium: 8.6 mg/dL — ABNORMAL LOW (ref 8.9–10.3)
Chloride: 108 mmol/L (ref 98–111)
Creatinine, Ser: 0.45 mg/dL (ref 0.44–1.00)
GFR, Estimated: >60 mL/min
Glucose, Bld: 83 mg/dL (ref 70–99)
Potassium: 3.6 mmol/L (ref 3.5–5.1)
Sodium: 137 mmol/L (ref 135–145)
Total Bilirubin: <0.2 mg/dL (ref 0.0–1.2)
Total Protein: 5.6 g/dL — ABNORMAL LOW (ref 6.5–8.1)

## 2024-04-30 LAB — HIV ANTIBODY (ROUTINE TESTING W REFLEX): HIV Screen 4th Generation wRfx: NONREACTIVE

## 2024-04-30 LAB — TSH: TSH: 1.48 u[IU]/mL (ref 0.350–4.500)

## 2024-04-30 MED ORDER — ONDANSETRON HCL 4 MG/2ML IJ SOLN: 4.0000 mg | Freq: Four times a day (QID) | INTRAMUSCULAR | Status: DC | PRN

## 2024-04-30 MED ORDER — SENNOSIDES-DOCUSATE SODIUM 8.6-50 MG PO TABS: 1.0000 | ORAL_TABLET | Freq: Every evening | ORAL | Status: DC | PRN

## 2024-04-30 MED ORDER — GLUCAGON HCL RDNA (DIAGNOSTIC) 1 MG IJ SOLR: 1.0000 mg | INTRAMUSCULAR | Status: DC | PRN

## 2024-04-30 MED ORDER — OXYCODONE HCL 5 MG PO TABS
5.0000 mg | ORAL_TABLET | ORAL | Status: DC | PRN
  Administered 2024-04-30 – 2024-05-01 (×6): 5 mg via ORAL
  Filled 2024-04-30 (×7): qty 1

## 2024-04-30 MED ORDER — INFLUENZA VIRUS VACC SPLIT PF (FLUZONE) 0.5 ML IM SUSY
0.5000 mL | PREFILLED_SYRINGE | INTRAMUSCULAR | Status: AC
  Administered 2024-05-01: 0.5 mL via INTRAMUSCULAR
  Filled 2024-04-30: qty 0.5

## 2024-04-30 MED ORDER — TRAMADOL HCL 50 MG PO TABS: 50.0000 mg | ORAL_TABLET | Freq: Three times a day (TID) | ORAL | Status: DC | PRN

## 2024-04-30 MED ORDER — METOPROLOL TARTRATE 5 MG/5ML IV SOLN: 5.0000 mg | INTRAVENOUS | Status: DC | PRN

## 2024-04-30 MED ORDER — NITROFURANTOIN MONOHYD MACRO 100 MG PO CAPS
100.0000 mg | ORAL_CAPSULE | Freq: Once | ORAL | Status: AC
  Administered 2024-04-30: 100 mg via ORAL
  Filled 2024-04-30 (×2): qty 1

## 2024-04-30 MED ORDER — ACETAMINOPHEN 325 MG PO TABS
650.0000 mg | ORAL_TABLET | Freq: Four times a day (QID) | ORAL | Status: DC | PRN
  Administered 2024-04-30: 650 mg via ORAL
  Filled 2024-04-30: qty 2

## 2024-04-30 MED ORDER — ONDANSETRON HCL 4 MG PO TABS: 4.0000 mg | ORAL_TABLET | Freq: Four times a day (QID) | ORAL | Status: DC | PRN

## 2024-04-30 MED ORDER — ACETAMINOPHEN 325 MG PO TABS
650.0000 mg | ORAL_TABLET | Freq: Four times a day (QID) | ORAL | Status: DC | PRN
  Administered 2024-05-01 (×2): 650 mg via ORAL
  Filled 2024-04-30 (×2): qty 2

## 2024-04-30 MED ORDER — GUAIFENESIN 100 MG/5ML PO LIQD: 5.0000 mL | ORAL | Status: DC | PRN

## 2024-04-30 MED ORDER — LACTATED RINGERS IV SOLN: INTRAVENOUS | Status: AC

## 2024-04-30 NOTE — Progress Notes (Signed)
 Physical Therapy Treatment  Patient Details Name: Deborah Gibbs MRN: [id number] DOB: 1990/07/18 Today's Date: 04/30/2024   History of Present Illness Pt is a 34 y/o female who presents 04/29/2024 s/p syncope and collapse at home ~2 days PTA. Pt hit her head with fall and with residual headaches, dizziness and blurred vision. OB directed her to ED for further assessment. MRI/MRV head and C-Spine negative for acute changes. PMH significant for Prior back surgery, DOE, anxiety.    PT Comments  Pt progressing towards physical therapy goals. Pt sitting up unsupported in bed when PT arrived, complaining of headache. Overall BP stable this session with slow transitions from sitting to standing. Pt able to ambulate around the room with IV pole for support. She reports feeling unsteady and wobbly however no overt LOB noted. Continue to recommend BSC use at this time. Pt most limited by concussion symptoms, most notably headache, dizziness, and blurred/double vision. Pt unknowingly keeping L eye closed or squinted throughout session, however with double vision in R eye. Will continue to follow and progress as able per POC.    Orthostatic BPs  Sitting 106/66  Standing 108/65  Standing after 3 min 101/68       If plan is discharge home, recommend the following: A little help with walking and/or transfers;A little help with bathing/dressing/bathroom;Assistance with cooking/housework;Assist for transportation;Help with stairs or ramp for entrance   Can travel by private vehicle        Equipment Recommendations  None recommended by PT    Recommendations for Other Services OT consult     Precautions / Restrictions Precautions Precautions: Fall Recall of Precautions/Restrictions: Intact Precaution/Restrictions Comments: Watch BP Restrictions Weight Bearing Restrictions Per Provider Order: No     Mobility  Bed Mobility Overal bed mobility: Modified Independent             General bed  mobility comments: Pt able to transition to/from EOB without difficulty. Increased time    Transfers Overall transfer level: Needs assistance Equipment used: None Transfers: Sit to/from Stand Sit to Stand: Supervision           General transfer comment: Slow rise to stand due to headache but able to complete without difficulty. Close supervision for safety.    Ambulation/Gait Ambulation/Gait assistance: Contact guard assist Gait Distance (Feet): 60 Feet Assistive device: IV Pole Gait Pattern/deviations: Step-through pattern, Decreased stride length, Wide base of support Gait velocity: Decreased Gait velocity interpretation: <1.31 ft/sec, indicative of household ambulator   General Gait Details: In room only due to headache and dizziness. Pt utilized IV pole for support. Pt reports feeling unsteady and wobbly throughout however no overt LOB noted and did not require any assistance throughout.   Stairs             Wheelchair Mobility     Tilt Bed    Modified Rankin (Stroke Patients Only)       Balance Overall balance assessment: Mild deficits observed, not formally tested                                          Communication Communication Communication: No apparent difficulties  Cognition Arousal: Alert Behavior During Therapy: WFL for tasks assessed/performed   PT - Cognitive impairments: No apparent impairments                       PT -  Cognition Comments: Having some amnesia of the events before and after the syncope. Remembers getting up to use the bathroom and then does not remember how she got out of the bathroom to their living room or to the hospital after. Reports that she feels she has had difficulty with word finding and slow processing since the fall. Following commands: Intact      Cueing Cueing Techniques: Verbal cues, Gestural cues  Exercises      General Comments General comments (skin integrity, edema,  etc.): Blurred vision that gets worse the longer she is awake/the more her eyes fatigue. Double vision in R eye as well. Noted pt holding L eye closed or at a squint. Difficult for pt to close R eye only.      Pertinent Vitals/Pain Pain Assessment Pain Assessment: Faces Pain Score: 8  Faces Pain Scale: Hurts whole lot Pain Location: R side of head Pain Descriptors / Indicators: Headache Pain Intervention(s): Limited activity within patient's tolerance, Monitored during session, Repositioned    Home Living                          Prior Function            PT Goals (current goals can now be found in the care plan section) Acute Rehab PT Goals Patient Stated Goal: Be able to return home PT Goal Formulation: With patient/family Time For Goal Achievement: 05/07/24 Potential to Achieve Goals: Good Progress towards PT goals: Progressing toward goals    Frequency    Min 2X/week      PT Plan      Co-evaluation              AM-PAC PT 6 Clicks Mobility   Outcome Measure  Help needed turning from your back to your side while in a flat bed without using bedrails?: None Help needed moving from lying on your back to sitting on the side of a flat bed without using bedrails?: None Help needed moving to and from a bed to a chair (including a wheelchair)?: A Little Help needed standing up from a chair using your arms (e.g., wheelchair or bedside chair)?: A Little Help needed to walk in hospital room?: A Little Help needed climbing 3-5 steps with a railing? : A Little 6 Click Score: 20    End of Session Equipment Utilized During Treatment: Gait belt Activity Tolerance: Patient limited by pain Patient left: in bed;with call bell/phone within reach Nurse Communication: Mobility status PT Visit Diagnosis: Difficulty in walking, not elsewhere classified (R26.2);Pain;Other symptoms and signs involving the nervous system (R29.898) Pain - part of body:  (head)      Time: 1434-1450 PT Time Calculation (min) (ACUTE ONLY): 16 min  Charges:    $Gait Training: 8-22 mins PT General Charges $$ ACUTE PT VISIT: 1 Visit                     Leita Sable, PT, DPT Acute Rehabilitation Services Secure Chat Preferred Office: [phone]    Leita JONETTA Sable 04/30/2024, 3:08 PM

## 2024-04-30 NOTE — Progress Notes (Addendum)
 " PROGRESS NOTE    Deborah Gibbs  FMW:[id number] DOB: 1990/05/04 DOA: 04/29/2024 PCP: Caleen Dirks, MD    Brief Narrative:   34 year old with history of depression, anxiety who is [redacted] weeks pregnant comes to the hospital with lightheadedness, dizziness.  Patient states she had a syncopal episode 2 days prior to admission and since then she has has been having diffuse headache, blurry vision and lightheadedness.  She called her OB/GYN office who directed to come to the hospital.  Workup including MRI brain, MRV head and C-spine are unremarkable.  TSH, cortisol are negative.  Assessment & Plan:  Headache, dizziness Concussion, moderate to severe - After fall couple of days ago.  MRI brain, cervical spine and MRV of head are unremarkable.  TSH, cortisol are normal.  Blood pressure occasionally slightly soft but expected during pregnancy.  Will obtain echocardiogram.  This is a likely concussion and should improve with time.  For now pain control.  I discussed with Dr. Larraine Sharps who is okay with treating this with Tylenol , tramadol  and oxycodone .  We can add muscle relaxer if necessary.  Their service will follow-up outpatient in 1 week's time. -Continue IV fluids -Will have PT see her to make sure she is steady  Depression/anxiety - Does not seem to be any home medications?  Likely during pregnancy.  Record from several months ago shows she used to be on Vistaril  and Prozac   Pregnancy, 17 weeks - Discussed with [facility], Dr. Sharps.  She is aware of this patient.  They will arrange for outpatient follow-up in 1 week's time but will be available for any questions and management as needed   DVT prophylaxis: SCDs Start: 04/30/24 0100    Code Status: Full Code Family Communication:   Continue hospital stay for at least next 24 hours   PT Follow up Recs:   Subjective: Still having quite a bit of unsteady gait, severe headache, some diplopia and blurry vision.  Denies any nausea and  vomiting  Tells me her fall was on 1/28 and had episode of amnesia lasting for about 3 hours.   Examination:  General exam: Appears calm and comfortable  Respiratory system: Clear to auscultation. Respiratory effort normal. Cardiovascular system: S1 & S2 heard, RRR. No JVD, murmurs, rubs, gallops or clicks. No pedal edema. Gastrointestinal system: Abdomen is nondistended, soft and nontender. No organomegaly or masses felt. Normal bowel sounds heard. Central nervous system: Alert and oriented. No focal neurological deficits. Extremities: Symmetric 5 x 5 power. Skin: No rashes, lesions or ulcers Psychiatry: Judgement and insight appear normal. Mood & affect appropriate.                Diet Orders (From admission, onward)     Start     Ordered   04/30/24 0100  Diet regular Room service appropriate? Yes; Fluid consistency: Thin  Diet effective now       Question Answer Comment  Room service appropriate? Yes   Fluid consistency: Thin      04/30/24 0059            Objective: Vitals:   04/30/24 0737 04/30/24 0800 04/30/24 0815 04/30/24 0930  BP: 97/62 100/72  103/66  Pulse: 76 74  83  Resp: 14  (!) 21 19  Temp:    98.6 F (37 C)  TempSrc:    Oral  SpO2: 100% 100%  100%   No intake or output data in the 24 hours ending 04/30/24 1016 There were no  vitals filed for this visit.  Scheduled Meds: Continuous Infusions:  lactated ringers  100 mL/hr at 04/30/24 0115    Nutritional status     There is no height or weight on file to calculate BMI.  Data Reviewed:   CBC: Recent Labs  Lab 04/29/24 1953 04/30/24 0616  WBC 13.1* 11.3*  NEUTROABS 8.4*  --   HGB 11.7* 10.6*  HCT 34.8* 30.9*  MCV 88.5 87.0  PLT 241 215   Basic Metabolic Panel: Recent Labs  Lab 04/29/24 1953 04/30/24 0616  NA 135 137  K 3.8 3.6  CL 102 108  CO2 20* 20*  GLUCOSE 84 83  BUN <5* <5*  CREATININE 0.46 0.45  CALCIUM 9.4 8.6*  MG 1.9  --    GFR: CrCl cannot be  calculated (Unknown ideal weight.). Liver Function Tests: Recent Labs  Lab 04/29/24 1953 04/30/24 0616  AST 38 24  ALT 24 20  ALKPHOS 33* 27*  BILITOT 0.3 <0.2  PROT 6.6 5.6*  ALBUMIN 3.9 3.4*   No results for input(s): LIPASE, AMYLASE in the last 168 hours. No results for input(s): AMMONIA in the last 168 hours. Coagulation Profile: No results for input(s): INR, PROTIME in the last 168 hours. Cardiac Enzymes: No results for input(s): CKTOTAL, CKMB, CKMBINDEX, TROPONINI in the last 168 hours. BNP (last 3 results) Recent Labs    04/29/24 1953  PROBNP <50.0   HbA1C: No results for input(s): HGBA1C in the last 72 hours. CBG: No results for input(s): GLUCAP in the last 168 hours. Lipid Profile: No results for input(s): CHOL, HDL, LDLCALC, TRIG, CHOLHDL, LDLDIRECT in the last 72 hours. Thyroid Function Tests: Recent Labs    04/30/24 0616  TSH 1.480   Anemia Panel: No results for input(s): VITAMINB12, FOLATE, FERRITIN, TIBC, IRON, RETICCTPCT in the last 72 hours. Sepsis Labs: No results for input(s): PROCALCITON, LATICACIDVEN in the last 168 hours.  No results found for this or any previous visit (from the past 240 hours).       Radiology Studies: MR Cervical Spine Wo Contrast Result Date: 04/29/2024 EXAM: MRI CERVICAL SPINE WITHOUT CONTRAST 04/29/2024 07:04:00 PM TECHNIQUE: Multiplanar multisequence MRI of the cervical spine was performed. COMPARISON: None available. CLINICAL HISTORY: Neck trauma dizziness, headache, neck pain, vision changes with right eye double vision and significant headache. Syncope. Neurology recommended MRI brain, MRV head, and MR C-spine without contrast. FINDINGS: BONES AND ALIGNMENT: Normal alignment. Normal vertebral body heights. Bone marrow signal is unremarkable. SPINAL CORD: Normal spinal cord size. No abnormal spinal cord signal. SOFT TISSUES: No paraspinal mass. C2-C3: No significant disc  herniation. No spinal canal stenosis or neural foraminal narrowing. C3-C4: No significant disc herniation. No spinal canal stenosis or neural foraminal narrowing. C4-C5: No significant disc herniation. Left uncovertebral hypertrophy. No spinal canal stenosis or neural foraminal narrowing. C5-C6: Small posterior disc osteophyte complex. No spinal canal stenosis or neural foraminal narrowing. C6-C7: Small posterior disc osteophyte complex. No spinal canal stenosis or neural foraminal narrowing. C7-T1: No significant disc herniation. No spinal canal stenosis or neural foraminal narrowing. IMPRESSION: 1. No spinal canal stenosis or neural foraminal narrowing in the cervical spine. Electronically signed by: Glendia Molt MD 04/29/2024 07:18 PM EST RP Workstation: [id number]   MR MRV HEAD WO CM Result Date: 04/29/2024 EXAM: MRV BRAIN 04/29/2024 07:04:00 PM TECHNIQUE: Multiplanar multisequence MRV of the head was performed without the administration of intravenous contrast. Multiplanar 2D and 3D reformatted images are provided for review. COMPARISON: CT Head 04/21/17 CLINICAL HISTORY: Neck trauma  dizziness, headache, neck pain, vision changes with right eye double vision and significant headache. Syncope. FINDINGS: No focal stenosis or thrombus is seen of the major dural venous sinuses. IMPRESSION: 1. Unremarkable MRV of the head. Electronically signed by: Glendia Molt MD 04/29/2024 07:15 PM EST RP Workstation: [id number]   MR BRAIN WO CONTRAST Result Date: 04/29/2024 EXAM: MRI BRAIN WITHOUT CONTRAST 04/29/2024 07:04:00 PM TECHNIQUE: Multiplanar multisequence MRI of the head/brain was performed without the administration of intravenous contrast. COMPARISON: None available. CLINICAL HISTORY: Neck trauma dizziness, headache, neck pain, vision changes with right eye double vision and significant headache. Syncope. FINDINGS: BRAIN AND VENTRICLES: No acute infarct. No intracranial hemorrhage. No mass. No midline shift. No  hydrocephalus. The sella is unremarkable. Normal flow voids. ORBITS: No significant abnormality. SINUSES AND MASTOIDS: No significant abnormality. BONES AND SOFT TISSUES: Normal marrow signal. No soft tissue abnormality. IMPRESSION: 1. No acute abnormality. Electronically signed by: Glendia Molt MD 04/29/2024 07:12 PM EST RP Workstation: [id number]           LOS: 0 days   Time spent= 35 mins    Burgess JAYSON Dare, MD [facility]  If 7PM-7AM, please contact night-coverage  04/30/2024, 10:16 AM  "

## 2024-04-30 NOTE — Plan of Care (Signed)

## 2024-04-30 NOTE — Progress Notes (Signed)
" °  Echocardiogram 2D Echocardiogram has been performed.  Devora Ellouise SAUNDERS 04/30/2024, 9:07 AM "

## 2024-04-30 NOTE — Hospital Course (Addendum)
 Brief Narrative:   34 year old with history of depression, anxiety who is [redacted] weeks pregnant comes to the hospital with lightheadedness, dizziness.  Patient states she had a syncopal episode 2 days prior to admission and since then she has has been having diffuse headache, blurry vision and lightheadedness.  She called her OB/GYN office who directed to come to the hospital.  Workup including MRI brain, MRV head and C-spine are unremarkable.  TSH, cortisol are negative.  Assessment & Plan:  Headache, dizziness Concussion, moderate to severe - After fall couple of days ago.  MRI brain, cervical spine and MRV of head are unremarkable.  TSH, cortisol are normal.  Blood pressure occasionally slightly soft but expected during pregnancy.  Will obtain echocardiogram.  This is a likely concussion and should improve with time.  For now pain control.  I discussed with Dr. Larraine Sharps who is okay with treating this with Tylenol , tramadol  and oxycodone .  We can add muscle relaxer if necessary.  Their service will follow-up outpatient in 1 week's time. -Continue IV fluids -Will have PT see her to make sure she is steady  Depression/anxiety - Does not seem to be any home medications?  Likely during pregnancy.  Record from several months ago shows she used to be on Vistaril  and Prozac   Pregnancy, 17 weeks - Discussed with [facility], Dr. Sharps.  She is aware of this patient.  They will arrange for outpatient follow-up in 1 week's time but will be available for any questions and management as needed   DVT prophylaxis: SCDs Start: 04/30/24 0100    Code Status: Full Code Family Communication:   Continue hospital stay for at least next 24 hours   PT Follow up Recs:   Subjective: Still having quite a bit of unsteady gait, severe headache, some diplopia and blurry vision.  Denies any nausea and vomiting  Tells me her fall was on 1/28 and had episode of amnesia lasting for about 3  hours.   Examination:  General exam: Appears calm and comfortable  Respiratory system: Clear to auscultation. Respiratory effort normal. Cardiovascular system: S1 & S2 heard, RRR. No JVD, murmurs, rubs, gallops or clicks. No pedal edema. Gastrointestinal system: Abdomen is nondistended, soft and nontender. No organomegaly or masses felt. Normal bowel sounds heard. Central nervous system: Alert and oriented. No focal neurological deficits. Extremities: Symmetric 5 x 5 power. Skin: No rashes, lesions or ulcers Psychiatry: Judgement and insight appear normal. Mood & affect appropriate.

## 2024-04-30 NOTE — H&P (Addendum)
 " History and Physical    Deborah Gibbs FMW:[id number] DOB: October 05, 1990 DOA: 04/29/2024  I have briefly reviewed the patient's prior medical records in [facility]  PCP: Caleen Dirks, MD  Patient coming from: home  Chief Complaint: syncope  HPI: Deborah Gibbs is a 34 y.o. female with medical history significant of depression, anxiety, currently pregnant at [redacted] weeks comes into the hospital with complaints of lightheadedness, dizziness and a syncopal episode couple of days ago.  She tells me that she has been feeling very unsteady on her feet, lightheaded, especially when standing up.  2 days ago she passed out and hit her head against a hard object.  Ever since she has been having headache, blurry vision, and has been feeling very lightheaded.  She denies any fever or chills.  She complains of intermittent nausea associated with her pregnancy, which is usually in the morning, but otherwise she feels like she has been able to keep up with her p.o. intake.  She denies any diarrhea.  She has no chest pain or shortness of breath, no palpitations.  She denies any vaginal bleeding or any other issues with her pregnancy.  She denies any dysuria  ED Course: In the emergency room she is afebrile, has been found to be hypotensive and she is 81/53 on my evaluation in the emergency room, she is satting well on room air.  Due to fall, hitting her head and headache she underwent MRI of the brain, C-spine, as well as an MRV, all unremarkable.  Blood work shows mild leukocytosis with a white count of 13, negative troponin, negative proBNP.  Urinalysis shows rare bacteria but no WBC, no nitrates.  We are asked to admit for syncope, lightheadedness.  Dr. Vina Solian with ObGyn was consulted by EDP given ongoing pregnancy and we are asked to admit  Review of Systems: All systems reviewed, and apart from HPI, all negative  Past Medical History:  Diagnosis Date   Abortion    Allergy    Anxiety    Back pain     Depression    Fibroid    Food allergy    SOBOE (shortness of breath on exertion)     Past Surgical History:  Procedure Laterality Date   WISDOM TOOTH EXTRACTION       reports that she has never smoked. She has never used smokeless tobacco. She reports that she does not currently use alcohol. She reports that she does not currently use drugs after having used the following drugs: Marijuana.  Allergies[1]  Family History  Problem Relation Age of Onset   Hypertension Father    Depression Father    Anxiety disorder Father    Cancer Maternal Grandmother        Breast Cancer   Hypertension Mother    Depression Mother    Anxiety disorder Mother    Bipolar disorder Mother    Alcoholism Mother    Drug abuse Mother    Cancer Brother        neuroblastoma    Prior to Admission medications  Medication Sig Start Date End Date Taking? Authorizing Provider  acetaminophen  (TYLENOL ) 500 MG tablet Take 1,000 mg by mouth every 6 (six) hours as needed for moderate pain (pain score 4-6).   Yes [provider]  EPINEPHrine  (EPIPEN  2-PAK) 0.3 mg/0.3 mL IJ SOAJ injection Inject 0.3 mg into the muscle as needed for anaphylaxis. 06/26/23  Yes Jerilynn Daphne LOISE, NP  ondansetron  (ZOFRAN -ODT) 4 MG disintegrating  tablet Take 1 tablet (4 mg total) by mouth every 8 (eight) hours as needed for nausea or vomiting. 03/19/23  Yes Jerilynn Daphne SAILOR, NP  Prenatal Vit-Fe Fumarate-FA (MULTIVITAMIN-PRENATAL) 27-0.8 MG TABS tablet Take 1 tablet by mouth daily at 12 noon.   Yes [provider]    Physical Exam: Vitals:   04/29/24 1415 04/29/24 1503 04/29/24 1955 04/30/24 0006  BP: 99/67 106/61 102/65 (!) 90/50  Pulse: 79 75 83 85  Resp: 18 17 18 16   Temp: 98.9 F (37.2 C)  98.1 F (36.7 C) 99.3 F (37.4 C)  TempSrc:    Oral  SpO2:  100% 100% 100%   Constitutional: NAD, calm, comfortable Eyes: PERRL, lids and conjunctivae normal ENMT: Mucous membranes are moist.  Neck: normal,  supple Respiratory: clear to auscultation bilaterally, no wheezing, no crackles. Normal respiratory effort. No accessory muscle use.  Cardiovascular: Regular rate and rhythm, no murmurs / rubs / gallops. No extremity edema Abdomen: no tenderness, no masses palpated. Bowel sounds positive.  Musculoskeletal: no clubbing / cyanosis. Normal muscle tone.  Skin: no rashes, lesions, ulcers. No induration Neurologic: Nonfocal  Labs on Admission: I have personally reviewed following labs and imaging studies  CBC: Recent Labs  Lab 04/29/24 1953  WBC 13.1*  NEUTROABS 8.4*  HGB 11.7*  HCT 34.8*  MCV 88.5  PLT 241   Basic Metabolic Panel: Recent Labs  Lab 04/29/24 1953  NA 135  K 3.8  CL 102  CO2 20*  GLUCOSE 84  BUN <5*  CREATININE 0.46  CALCIUM 9.4  MG 1.9   Liver Function Tests: Recent Labs  Lab 04/29/24 1953  AST 38  ALT 24  ALKPHOS 33*  BILITOT 0.3  PROT 6.6  ALBUMIN 3.9   Coagulation Profile: No results for input(s): INR, PROTIME in the last 168 hours. BNP (last 3 results) Recent Labs    04/29/24 1953  PROBNP <50.0   CBG: No results for input(s): GLUCAP in the last 168 hours. Thyroid Function Tests: Recent Labs    04/29/24 1953  TSH 1.080   Urine analysis:    Component Value Date/Time   COLORURINE YELLOW 04/29/2024 2205   APPEARANCEUR HAZY (A) 04/29/2024 2205   LABSPEC 1.004 (L) 04/29/2024 2205   PHURINE 7.0 04/29/2024 2205   GLUCOSEU NEGATIVE 04/29/2024 2205   HGBUR NEGATIVE 04/29/2024 2205   BILIRUBINUR NEGATIVE 04/29/2024 2205   KETONESUR 5 (A) 04/29/2024 2205   PROTEINUR NEGATIVE 04/29/2024 2205   UROBILINOGEN 0.2 11/28/2015 1304   NITRITE NEGATIVE 04/29/2024 2205   LEUKOCYTESUR NEGATIVE 04/29/2024 2205     Radiological Exams on Admission: MR Cervical Spine Wo Contrast Result Date: 04/29/2024 EXAM: MRI CERVICAL SPINE WITHOUT CONTRAST 04/29/2024 07:04:00 PM TECHNIQUE: Multiplanar multisequence MRI of the cervical spine was performed.  COMPARISON: None available. CLINICAL HISTORY: Neck trauma dizziness, headache, neck pain, vision changes with right eye double vision and significant headache. Syncope. Neurology recommended MRI brain, MRV head, and MR C-spine without contrast. FINDINGS: BONES AND ALIGNMENT: Normal alignment. Normal vertebral body heights. Bone marrow signal is unremarkable. SPINAL CORD: Normal spinal cord size. No abnormal spinal cord signal. SOFT TISSUES: No paraspinal mass. C2-C3: No significant disc herniation. No spinal canal stenosis or neural foraminal narrowing. C3-C4: No significant disc herniation. No spinal canal stenosis or neural foraminal narrowing. C4-C5: No significant disc herniation. Left uncovertebral hypertrophy. No spinal canal stenosis or neural foraminal narrowing. C5-C6: Small posterior disc osteophyte complex. No spinal canal stenosis or neural foraminal narrowing. C6-C7: Small posterior disc osteophyte  complex. No spinal canal stenosis or neural foraminal narrowing. C7-T1: No significant disc herniation. No spinal canal stenosis or neural foraminal narrowing. IMPRESSION: 1. No spinal canal stenosis or neural foraminal narrowing in the cervical spine. Electronically signed by: Glendia Molt MD 04/29/2024 07:18 PM EST RP Workstation: [id number]   MR MRV HEAD WO CM Result Date: 04/29/2024 EXAM: MRV BRAIN 04/29/2024 07:04:00 PM TECHNIQUE: Multiplanar multisequence MRV of the head was performed without the administration of intravenous contrast. Multiplanar 2D and 3D reformatted images are provided for review. COMPARISON: CT Head 04/21/17 CLINICAL HISTORY: Neck trauma dizziness, headache, neck pain, vision changes with right eye double vision and significant headache. Syncope. FINDINGS: No focal stenosis or thrombus is seen of the major dural venous sinuses. IMPRESSION: 1. Unremarkable MRV of the head. Electronically signed by: Glendia Molt MD 04/29/2024 07:15 PM EST RP Workstation: [id number]   MR BRAIN WO  CONTRAST Result Date: 04/29/2024 EXAM: MRI BRAIN WITHOUT CONTRAST 04/29/2024 07:04:00 PM TECHNIQUE: Multiplanar multisequence MRI of the head/brain was performed without the administration of intravenous contrast. COMPARISON: None available. CLINICAL HISTORY: Neck trauma dizziness, headache, neck pain, vision changes with right eye double vision and significant headache. Syncope. FINDINGS: BRAIN AND VENTRICLES: No acute infarct. No intracranial hemorrhage. No mass. No midline shift. No hydrocephalus. The sella is unremarkable. Normal flow voids. ORBITS: No significant abnormality. SINUSES AND MASTOIDS: No significant abnormality. BONES AND SOFT TISSUES: Normal marrow signal. No soft tissue abnormality. IMPRESSION: 1. No acute abnormality. Electronically signed by: Glendia Molt MD 04/29/2024 07:12 PM EST RP Workstation: [id number]    EKG: Independently reviewed. Sinus rhythm  Assessment/Plan Principal problem Syncope, lightheadedness, hypotension -she may be slightly dry, continue IV fluids.  There is no evidence of an ongoing infection, urinalysis unremarkable, she has no respiratory symptoms, no URI type symptoms.  She had an cold about a week ago but symptoms have since resolved - Obtain 2D echo - Obtain TSH, cortisol for screening.  - Continue fluids overnight, obtain orthostatics in the morning  Active problems Pregnancy-at 17 weeks, OB/GYN consulted by EDP as above  DVT prophylaxis: SCDs  Code Status: Full code  Family Communication: Husband at bedside Bed Type: Telemetry Consults called: OB/GYN   Nilda Fendt, MD, PhD [facility]  Contact via [email or web address]  04/30/2024, 12:24 AM         [1]  Allergies Allergen Reactions   Amoxicillin Shortness Of Breath and Itching   Pepcid  [Famotidine ] Hives   Bee Venom    Clavulanic Acid Other (See Comments)   Diphenhydramine  Hcl Hives, Swelling and Other (See Comments)    Reaction:  Facial/hand swelling   Meloxicam Hives,  Swelling and Other (See Comments)    Reaction:  Facial/hand swelling  Pt has tolerated ibuprofen  in the past without reaction   Penicillins Hives and Other (See Comments)    Has patient had a PCN reaction causing immediate rash, facial/tongue/throat swelling, SOB or lightheadedness with hypotension: No Has patient had a PCN reaction causing severe rash involving mucus membranes or skin necrosis: No Has patient had a PCN reaction that required hospitalization No Has patient had a PCN reaction occurring within the last 10 years: Yes If all of the above answers are NO, then may proceed with Cephalosporin use.    Pineapple Hives   "

## 2024-04-30 NOTE — Evaluation (Addendum)
 Physical Therapy Evaluation  Patient Details Name: Deborah Gibbs MRN: [id number] DOB: [birth year] Today's Date: 04/30/2024  History of Present Illness  Pt is a 34 y/o female who presents 04/29/2024 s/p syncope and collapse at home ~2 days PTA. Pt hit her head with fall and with residual headaches, dizziness and blurred vision. OB directed her to ED for further assessment. MRI/MRV head and C-Spine negative for acute changes. PMH significant for Prior back surgery, DOE, anxiety.   Clinical Impression  Pt admitted with above diagnosis. Pt currently with functional limitations due to the deficits listed below (see PT Problem List). At the time of PT eval, hypotension limiting mobility. RN present to discuss and recommend pt use BSC at this time instead of walking into the bathroom. Education provided to patient and husband re: concussion management and handout given. Advised pt to avoid quick movements/position changes, as noted during session pt sat up quickly to reach for her water and she had an immediate report of 10/10 headache and increased dizziness. Pt reporting ongoing blurred vision bilaterally, with diplopia in R eye. Anticipate once BP stabilizes she will not have any follow up physical therapy needs. Acutely, pt will benefit from skilled PT to increase their independence and safety with mobility to allow discharge.       Orthostatic BPs  Supine - with feeling of lightheadedness/dizziness 87/48  Supine with bed in trendelenburg to -8 degrees 96/62         If plan is discharge home, recommend the following: A little help with walking and/or transfers;A little help with bathing/dressing/bathroom;Assistance with cooking/housework;Assist for transportation;Help with stairs or ramp for entrance   Can travel by private vehicle        Equipment Recommendations None recommended by PT  Recommendations for Other Services  OT consult    Functional Status Assessment Patient has had a recent  decline in their functional status and demonstrates the ability to make significant improvements in function in a reasonable and predictable amount of time.     Precautions / Restrictions Precautions Precautions: Fall Recall of Precautions/Restrictions: Intact Precaution/Restrictions Comments: Watch BP Restrictions Weight Bearing Restrictions Per Provider Order: No      Mobility  Bed Mobility Overal bed mobility: Modified Independent             General bed mobility comments: Pt able to reposition in bed and transition into long sitting without difficulty. Advised to avoid fast transitions - pt sat up quickly and had a sudden increase in HA and dizziness. Pt reports she doesn't remember to take it slow.    Transfers                   General transfer comment: Did not attempt OOB transfers this date due to hypotension    Ambulation/Gait                  Stairs            Wheelchair Mobility     Tilt Bed    Modified Rankin (Stroke Patients Only)       Balance                                             Pertinent Vitals/Pain Pain Assessment Pain Assessment: 0-10 Pain Score: 8  Pain Location: R side of head Pain Descriptors / Indicators: Headache Pain Intervention(s):  Limited activity within patient's tolerance, Monitored during session, Repositioned, Premedicated before session    Home Living Family/patient expects to be discharged to:: Private residence Living Arrangements: Spouse/significant other Available Help at Discharge: Family;Available 24 hours/day Type of Home: House Home Access: Stairs to enter Entrance Stairs-Rails: Right;Left;Can reach both Entrance Stairs-Number of Steps: 5   Home Layout: One level Home Equipment: None      Prior Function Prior Level of Function : Independent/Modified Independent;Driving;Working/employed             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        Extremity/Trunk Assessment   Upper Extremity Assessment Upper Extremity Assessment: Overall WFL for tasks assessed (Limited assessment however appears WFL)    Lower Extremity Assessment Lower Extremity Assessment: Overall WFL for tasks assessed (Limited assessment however appears WFL)    Cervical / Trunk Assessment Cervical / Trunk Assessment: Normal  Communication   Communication Communication: No apparent difficulties    Cognition Arousal: Alert Behavior During Therapy: WFL for tasks assessed/performed                           PT - Cognition Comments: Having some amnesia of the event before and after the syncope. Remembers getting up to use the bathroom and then does not remember how she got out of the bathroom to their living room or to the hospital after. Reports that she feels she has had difficulty with word finding and slow processing since the fall. Following commands: Intact       Cueing Cueing Techniques: Verbal cues, Gestural cues     General Comments General comments (skin integrity, edema, etc.): Blurred vision that gets worse the longer she is awake/the more her eyes fatigue. Double vision in R eye as well.    Exercises     Assessment/Plan    PT Assessment Patient needs continued PT services  PT Problem List Decreased balance;Decreased activity tolerance;Decreased cognition;Decreased safety awareness;Decreased knowledge of precautions;Pain       PT Treatment Interventions DME instruction;Gait training;Stair training;Functional mobility training;Therapeutic activities;Therapeutic exercise;Balance training;Patient/family education;Cognitive remediation    PT Goals (Current goals can be found in the Care Plan section)  Acute Rehab PT Goals Patient Stated Goal: Be able to return home PT Goal Formulation: With patient/family Time For Goal Achievement: 05/07/24 Potential to Achieve Goals: Good    Frequency Min 2X/week     Co-evaluation                AM-PAC PT 6 Clicks Mobility  Outcome Measure Help needed turning from your back to your side while in a flat bed without using bedrails?: None Help needed moving from lying on your back to sitting on the side of a flat bed without using bedrails?: None Help needed moving to and from a bed to a chair (including a wheelchair)?: A Little Help needed standing up from a chair using your arms (e.g., wheelchair or bedside chair)?: A Little Help needed to walk in hospital room?: A Little Help needed climbing 3-5 steps with a railing? : None 6 Click Score: 21    End of Session   Activity Tolerance: Treatment limited secondary to medical complications (Comment) (hypotension) Patient left: in bed;with call bell/phone within reach;with nursing/sitter in room;with family/visitor present Nurse Communication: Mobility status;Other (comment) (BP status, recommend use of BSC instead of walking into bathroom right now.) PT Visit Diagnosis: Difficulty in walking, not elsewhere classified (R26.2);Pain;Other symptoms and signs involving  the nervous system (R29.898) Pain - part of body:  (head)    Time: 8947-8882 PT Time Calculation (min) (ACUTE ONLY): 25 min   Charges:   PT Evaluation $PT Eval Moderate Complexity: 1 Mod PT Treatments $Therapeutic Activity: 8-22 mins PT General Charges $$ ACUTE PT VISIT: 1 Visit         Leita Sable, PT, DPT Acute Rehabilitation Services Secure Chat Preferred Office: [phone]   Leita JONETTA Sable 04/30/2024, 12:10 PM

## 2024-04-30 NOTE — ED Notes (Addendum)
 Pt complains of 10/10 pounding headache, dizziness, and blurred vision. Requesting something for her headache, Tylenol  did not help earlier. Caleen, MD notified.

## 2024-05-01 DIAGNOSIS — R55 Syncope and collapse: Secondary | ICD-10-CM | POA: Diagnosis not present

## 2024-05-01 LAB — URINE CULTURE: Culture: 50000 — AB

## 2024-05-01 LAB — BASIC METABOLIC PANEL WITH GFR
Anion gap: 10 (ref 5–15)
BUN: <5 mg/dL — ABNORMAL LOW (ref 6–20)
CO2: 22 mmol/L (ref 22–32)
Calcium: 8.6 mg/dL — ABNORMAL LOW (ref 8.9–10.3)
Chloride: 102 mmol/L (ref 98–111)
Creatinine, Ser: 0.39 mg/dL — ABNORMAL LOW (ref 0.44–1.00)
GFR, Estimated: >60 mL/min
Glucose, Bld: 99 mg/dL (ref 70–99)
Potassium: 3.7 mmol/L (ref 3.5–5.1)
Sodium: 135 mmol/L (ref 135–145)

## 2024-05-01 LAB — MAGNESIUM: Magnesium: 1.5 mg/dL — ABNORMAL LOW (ref 1.7–2.4)

## 2024-05-01 MED ORDER — MAGNESIUM SULFATE 2 GM/50ML IV SOLN
2.0000 g | Freq: Once | INTRAVENOUS | Status: AC
  Administered 2024-05-01: 2 g via INTRAVENOUS
  Filled 2024-05-01: qty 50

## 2024-05-01 NOTE — Plan of Care (Signed)

## 2024-05-01 NOTE — Plan of Care (Signed)
" °  Problem: Education: Goal: Knowledge of General Education information will improve Description: Including pain rating scale, medication(s)/side effects and non-pharmacologic comfort measures 05/01/2024 1723 by Jackquline Camelia LABOR, RN Outcome: Adequate for Discharge 05/01/2024 9147 by Jackquline Camelia LABOR, RN Outcome: Progressing   Problem: Health Behavior/Discharge Planning: Goal: Ability to manage health-related needs will improve 05/01/2024 1723 by Jackquline Camelia LABOR, RN Outcome: Adequate for Discharge 05/01/2024 9147 by Jackquline Camelia LABOR, RN Outcome: Progressing   Problem: Clinical Measurements: Goal: Ability to maintain clinical measurements within normal limits will improve 05/01/2024 1723 by Jackquline Camelia LABOR, RN Outcome: Adequate for Discharge 05/01/2024 9147 by Jackquline Camelia LABOR, RN Outcome: Progressing Goal: Will remain free from infection 05/01/2024 1723 by Jackquline Camelia LABOR, RN Outcome: Adequate for Discharge 05/01/2024 9147 by Jackquline Camelia LABOR, RN Outcome: Progressing Goal: Diagnostic test results will improve 05/01/2024 1723 by Jackquline Camelia LABOR, RN Outcome: Adequate for Discharge 05/01/2024 9147 by Jackquline Camelia LABOR, RN Outcome: Progressing Goal: Respiratory complications will improve 05/01/2024 1723 by Jackquline Camelia LABOR, RN Outcome: Adequate for Discharge 05/01/2024 9147 by Jackquline Camelia LABOR, RN Outcome: Progressing Goal: Cardiovascular complication will be avoided 05/01/2024 1723 by Jackquline Camelia LABOR, RN Outcome: Adequate for Discharge 05/01/2024 9147 by Jackquline Camelia LABOR, RN Outcome: Progressing   Problem: Activity: Goal: Risk for activity intolerance will decrease 05/01/2024 1723 by Jackquline Camelia LABOR, RN Outcome: Adequate for Discharge 05/01/2024 9147 by Jackquline Camelia LABOR, RN Outcome: Progressing   Problem: Nutrition: Goal: Adequate nutrition will be maintained 05/01/2024 1723 by Jackquline Camelia LABOR, RN Outcome: Adequate for Discharge 05/01/2024 9147 by  Jackquline Camelia LABOR, RN Outcome: Progressing   Problem: Coping: Goal: Level of anxiety will decrease 05/01/2024 1723 by Jackquline Camelia LABOR, RN Outcome: Adequate for Discharge 05/01/2024 9147 by Jackquline Camelia LABOR, RN Outcome: Progressing   Problem: Elimination: Goal: Will not experience complications related to bowel motility 05/01/2024 1723 by Jackquline Camelia LABOR, RN Outcome: Adequate for Discharge 05/01/2024 9147 by Jackquline Camelia LABOR, RN Outcome: Progressing Goal: Will not experience complications related to urinary retention 05/01/2024 1723 by Jackquline Camelia LABOR, RN Outcome: Adequate for Discharge 05/01/2024 9147 by Jackquline Camelia LABOR, RN Outcome: Progressing   Problem: Pain Managment: Goal: General experience of comfort will improve and/or be controlled 05/01/2024 1723 by Jackquline Camelia LABOR, RN Outcome: Adequate for Discharge 05/01/2024 9147 by Jackquline Camelia LABOR, RN Outcome: Progressing   Problem: Safety: Goal: Ability to remain free from injury will improve 05/01/2024 1723 by Jackquline Camelia LABOR, RN Outcome: Adequate for Discharge 05/01/2024 9147 by Jackquline Camelia LABOR, RN Outcome: Progressing   Problem: Skin Integrity: Goal: Risk for impaired skin integrity will decrease 05/01/2024 1723 by Jackquline Camelia LABOR, RN Outcome: Adequate for Discharge 05/01/2024 9147 by Jackquline Camelia LABOR, RN Outcome: Progressing   "

## 2024-05-01 NOTE — Progress Notes (Signed)
 Occupational Therapy Treatment Note  R lens partially occluded using 37M tape and plano glasses.Pt with significant accommodative dysfunction however Pt reports improvement/decreased complaints of double vision with use of partial occlusion. Educated pt/husband on use of partial occlusion and how to progress by decreasing the width of the tape as eyes strengthen. Issued sunglasses and continued to recommend pt purchase FL 41 lenses to help with concussive symptoms. See DC recs from previous note. Discussed with PT - would r/o BPPV as well. OT to follow acutely.     05/01/24 1100  OT Visit Information  Last OT Received On 05/01/24  Assistance Needed +1  History of Present Illness Pt is a 34 y/o female who presents 04/29/2024 s/p syncope and collapse at home ~2 days PTA. Pt hit her head with fall and with residual headaches, dizziness and blurred vision. OB directed her to ED for further assessment. MRI/MRV head and C-Spine negative for acute changes. PMH significant for Prior back surgery, DOE, anxiety.  Precautions  Precautions Fall  Recall of Precautions/Restrictions Intact  Precaution/Restrictions Comments Watch BP  Pain Assessment  Pain Assessment Faces  Faces Pain Scale 6  Pain Location R side of head  Pain Descriptors / Indicators Headache  Pain Intervention(s) Limited activity within patient's tolerance  Vision- Assessment  Additional Comments R eye appears exophoric  Other Exercises  Other Exercises accomodation exercises - pencil push ups - 1 eye at a time only  OT - End of Session  Activity Tolerance Patient tolerated treatment well  Patient left in bed;with call bell/phone within reach;with family/visitor present  Nurse Communication Mobility status;Other (comment)  OT Assessment/Plan  OT Visit Diagnosis Unsteadiness on feet (R26.81);Muscle weakness (generalized) (M62.81);History of falling (Z91.81);Low vision, both eyes (H54.2);Other symptoms and signs involving cognitive  function;Dizziness and giddiness (R42);Pain  Pain - part of body  (head)  OT Frequency (ACUTE ONLY) Min 2X/week  Follow Up Recommendations Other (comment)  Patient can return home with the following A little help with walking and/or transfers;A little help with bathing/dressing/bathroom;Assistance with cooking/housework;Assist for transportation;Direct supervision/assist for medications management  OT Equipment BSC/3in1  AM-PAC OT 6 Clicks Daily Activity Outcome Measure (Version 2)  Help from another person eating meals? 4  Help from another person taking care of personal grooming? 3  Help from another person toileting, which includes using toliet, bedpan, or urinal? 3  Help from another person bathing (including washing, rinsing, drying)? 3  Help from another person to put on and taking off regular upper body clothing? 3  Help from another person to put on and taking off regular lower body clothing? 3  6 Click Score 19  Progressive Mobility  What is the highest level of mobility based on the mobility assessment? Level 3 (Stands with assistance) - Balance while standing  and cannot march in place  OT Goal Progression  Progress towards OT goals Progressing toward goals  OT Time Calculation  OT Start Time (ACUTE ONLY) 1055  OT Stop Time (ACUTE ONLY) 1107  OT Time Calculation (min) 12 min  OT General Charges  $OT Visit 1 Visit  OT Treatments  $Therapeutic Activity 8-22 mins   Kreg Sink, OT/L   Acute OT Clinical Specialist Acute Rehabilitation Services Pager [phone] Office [phone]

## 2024-05-01 NOTE — Discharge Summary (Signed)
 " Physician Discharge Summary   Patient: Deborah Gibbs MRN: [id number] DOB: June 02, 1990  Admit date:     04/29/2024  Discharge date: 05/01/24  Discharge Physician: AIDA CHO   PCP: Caleen Dirks, MD   Recommendations at discharge:   Follow-up with PCP in 1 week  Discharge Diagnoses: Principal Problem:   Syncope  Resolved Problems:   * No resolved hospital problems. *  Hospital Course:   Deborah Gibbs is a 34 year old with history of depression, anxiety who is [redacted] weeks pregnant comes to the hospital with lightheadedness, dizziness.  Patient states she had a syncopal episode 2 days prior to admission and since then she has has been having diffuse headache, blurry vision and lightheadedness.  She called her OB/GYN office who directed her to come to the hospital.  Workup including MRI brain, MRV head and C-spine are unremarkable.  TSH, cortisol are negative.    Assessment and Plan:   Headache, dizziness Concussion, moderate to severe Vital signs are stable. S/p fall about 2 days prior to admission.  MRI brain, cervical spine and MRV of head are unremarkable.  TSH, cortisol are normal.  Blood pressure occasionally slightly soft but expected during pregnancy.   2D echo showed EF estimated at 55 to 60%, normal LV diastolic parameters. This is a likely concussion and should improve with time.  Dr. Larraine Sharps, obstetrician, will follow-up with patient in 1 week.   Hypomagnesemia This was repleted with IV magnesium sulfate 2 g x 1 dose.   Depression/anxiety Currently not on any medications.  Record from several months ago shows she used to be on Vistaril  and Prozac    Pregnancy, 17 weeks Dr. Sharps, obstetrician, is aware of this patient.  They will arrange for outpatient follow-up in 1 week's time but will be available for any questions and management as needed   Urinalysis showed squamous cells but there was no pyuria.  Urine culture showed multiple species.  This is likely  contamination.  No symptoms of UTI.   Patient was given the option of going home later today depending on how she feels or going home tomorrow.  Later in the day, she decided that she felt better and wanted to be discharged home.   Plan of care was discussed with the husband at the bedside.      Consultants: None Procedures performed: None Disposition: Home Diet recommendation:  Regular diet DISCHARGE MEDICATION: Allergies as of 05/01/2024       Reactions   Amoxicillin Shortness Of Breath, Itching   Pepcid  [famotidine ] Hives   Bee Venom    Clavulanic Acid Other (See Comments)   Diphenhydramine  Hcl Hives, Swelling, Other (See Comments)   Reaction:  Facial/hand swelling   Meloxicam Hives, Swelling, Other (See Comments)   Reaction:  Facial/hand swelling  Pt has tolerated ibuprofen  in the past without reaction   Penicillins Hives, Other (See Comments)   Has patient had a PCN reaction causing immediate rash, facial/tongue/throat swelling, SOB or lightheadedness with hypotension: No Has patient had a PCN reaction causing severe rash involving mucus membranes or skin necrosis: No Has patient had a PCN reaction that required hospitalization No Has patient had a PCN reaction occurring within the last 10 years: Yes If all of the above answers are NO, then may proceed with Cephalosporin use.   Pineapple Hives        Medication List     TAKE these medications    acetaminophen  500 MG tablet Commonly known as: TYLENOL  Take 1,000 mg  by mouth every 6 (six) hours as needed for moderate pain (pain score 4-6).   EPINEPHrine  0.3 mg/0.3 mL Soaj injection Commonly known as: EpiPen  2-Pak Inject 0.3 mg into the muscle as needed for anaphylaxis.   multivitamin-prenatal 27-0.8 MG Tabs tablet Take 1 tablet by mouth daily at 12 noon.   ondansetron  4 MG disintegrating tablet Commonly known as: ZOFRAN -ODT Take 1 tablet (4 mg total) by mouth every 8 (eight) hours as needed for nausea or  vomiting.        Discharge Exam:   GEN: NAD SKIN: Warm and dry EYES: No pallor or icterus ENT: MMM CV: RRR PULM: CTA B ABD: soft, ND, NT, +BS CNS: AAO x 3, non focal EXT: No edema or tenderness   Condition at discharge: good  The results of significant diagnostics from this hospitalization (including imaging, microbiology, ancillary and laboratory) are listed below for reference.   Imaging Studies: ECHOCARDIOGRAM COMPLETE Result Date: 04/30/2024    ECHOCARDIOGRAM REPORT   Patient Name:   Deborah Gibbs Date of Exam: 04/30/2024 Medical Rec #:  [id number]    Height:       63.0 in Accession #:    [id number]   Weight:       199.3 lb Date of Birth:  1990-11-20    BSA:          1.931 m Patient Age:    33 years     BP:           97/62 mmHg Patient Gender: F            HR:           64 bpm. Exam Location:  Inpatient Procedure: 2D Echo, 3D Echo, Cardiac Doppler, Color Doppler and Strain Analysis            (Both Spectral and Color Flow Doppler were utilized during            procedure). Indications:    I42.9 Cardiomyopathy (unspecified)  History:        Patient has no prior history of Echocardiogram examinations.                 Signs/Symptoms:Dizziness/Lightheadedness, Syncope, Shortness of                 Breath and Dyspnea.  Sonographer:    Ellouise Mose RDCS Referring Phys: [id number] BURGESS JAYSON DARE  Sonographer Comments: Patient is [redacted]weeks pregnant. IMPRESSIONS  1. Left ventricular ejection fraction, by estimation, is 55 to 60%. Left ventricular ejection fraction by 3D volume is 59 %. The left ventricle has normal function. The left ventricle has no regional wall motion abnormalities. Left ventricular diastolic  parameters were normal. The average left ventricular global longitudinal strain is -21.3 %. The global longitudinal strain is normal.  2. Right ventricular systolic function is normal. The right ventricular size is normal. The estimated right ventricular systolic pressure is 12.0 mmHg.  3. The  mitral valve is normal in structure. No evidence of mitral valve regurgitation. No evidence of mitral stenosis.  4. The aortic valve is tricuspid. Aortic valve regurgitation is not visualized. No aortic stenosis is present.  5. The inferior vena cava is normal in size with greater than 50% respiratory variability, suggesting right atrial pressure of 3 mmHg.  6. Cannot exclude a small PFO. Comparison(s): No prior Echocardiogram. FINDINGS  Left Ventricle: Left ventricular ejection fraction, by estimation, is 55 to 60%. Left ventricular ejection fraction by 3D volume is 59 %. The  left ventricle has normal function. The left ventricle has no regional wall motion abnormalities. The average left ventricular global longitudinal strain is -21.3 %. Strain was performed and the global longitudinal strain is normal. The left ventricular internal cavity size was normal in size. There is no left ventricular hypertrophy. Left ventricular diastolic parameters were normal. Right Ventricle: The right ventricular size is normal. No increase in right ventricular wall thickness. Right ventricular systolic function is normal. The tricuspid regurgitant velocity is 1.50 m/s, and with an assumed right atrial pressure of 3 mmHg, the estimated right ventricular systolic pressure is 12.0 mmHg. Left Atrium: Left atrial size was normal in size. Right Atrium: Right atrial size was normal in size. Pericardium: There is no evidence of pericardial effusion. Mitral Valve: The mitral valve is normal in structure. No evidence of mitral valve regurgitation. No evidence of mitral valve stenosis. Tricuspid Valve: The tricuspid valve is normal in structure. Tricuspid valve regurgitation is trivial. No evidence of tricuspid stenosis. Aortic Valve: The aortic valve is tricuspid. Aortic valve regurgitation is not visualized. No aortic stenosis is present. Pulmonic Valve: The pulmonic valve was normal in structure. Pulmonic valve regurgitation is not  visualized. No evidence of pulmonic stenosis. Aorta: The aortic root and ascending aorta are structurally normal, with no evidence of dilitation. Venous: The inferior vena cava is normal in size with greater than 50% respiratory variability, suggesting right atrial pressure of 3 mmHg. IAS/Shunts: Cannot exclude a small PFO. Additional Comments: 3D was performed not requiring image post processing on an independent workstation and was normal.  LEFT VENTRICLE PLAX 2D LVIDd:         4.80 cm         Diastology LVIDs:         2.80 cm         LV e' medial:    11.10 cm/s LV PW:         1.20 cm         LV E/e' medial:  7.5 LV IVS:        1.00 cm         LV e' lateral:   20.50 cm/s LVOT diam:     2.14 cm         LV E/e' lateral: 4.0 LV SV:         72 LV SV Index:   37              2D Longitudinal LVOT Area:     3.60 cm        Strain LV IVRT:       81 msec         2D Strain GLS   -21.3 %                                Avg:  LV Volumes (MOD)               3D Volume EF LV vol d, MOD    110.0 ml      LV 3D EF:    Left A2C:                                        ventricul LV vol d, MOD    74.6 ml  ar A4C:                                        ejection LV vol s, MOD    43.4 ml                    fraction A2C:                                        by 3D LV vol s, MOD    26.8 ml                    volume is A4C:                                        59 %. LV SV MOD A2C:   66.6 ml LV SV MOD A4C:   74.6 ml LV SV MOD BP:    58.7 ml       3D Volume EF:                                3D EF:        59 %                                LV EDV:       156 ml                                LV ESV:       65 ml                                LV SV:        92 ml RIGHT VENTRICLE             IVC RV S prime:     15.10 cm/s  IVC diam: 1.75 cm TAPSE (M-mode): 2.3 cm                             PULMONARY VEINS                             Diastolic Velocity: 43.80 cm/s                             S/D Velocity:       1.50                              Systolic Velocity:  64.50 cm/s LEFT ATRIUM           Index       RIGHT ATRIUM          Index LA diam:      3.14 cm 1.63 cm/m  RA Area:  9.03 cm LA Vol (A2C): 7.0 ml  3.63 ml/m  RA Volume:   14.00 ml 7.25 ml/m LA Vol (A4C): 16.9 ml 8.75 ml/m  AORTIC VALVE LVOT Vmax:   112.00 cm/s LVOT Vmean:  66.000 cm/s LVOT VTI:    0.201 m  AORTA Ao Root diam: 3.09 cm Ao Asc diam:  3.40 cm MITRAL VALVE               TRICUSPID VALVE MV Area (PHT): 3.15 cm    TR Peak grad:   9.0 mmHg MV Decel Time: 241 msec    TR Vmax:        150.00 cm/s MV E velocity: 82.80 cm/s MV A velocity: 69.70 cm/s  SHUNTS MV E/A ratio:  1.19        Systemic VTI:  0.20 m                            Systemic Diam: 2.14 cm Sunit Tolia Electronically signed by Madonna Large Signature Date/Time: 04/30/2024/11:17:46 AM    Final    MR Cervical Spine Wo Contrast Result Date: 04/29/2024 EXAM: MRI CERVICAL SPINE WITHOUT CONTRAST 04/29/2024 07:04:00 PM TECHNIQUE: Multiplanar multisequence MRI of the cervical spine was performed. COMPARISON: None available. CLINICAL HISTORY: Neck trauma dizziness, headache, neck pain, vision changes with right eye double vision and significant headache. Syncope. Neurology recommended MRI brain, MRV head, and MR C-spine without contrast. FINDINGS: BONES AND ALIGNMENT: Normal alignment. Normal vertebral body heights. Bone marrow signal is unremarkable. SPINAL CORD: Normal spinal cord size. No abnormal spinal cord signal. SOFT TISSUES: No paraspinal mass. C2-C3: No significant disc herniation. No spinal canal stenosis or neural foraminal narrowing. C3-C4: No significant disc herniation. No spinal canal stenosis or neural foraminal narrowing. C4-C5: No significant disc herniation. Left uncovertebral hypertrophy. No spinal canal stenosis or neural foraminal narrowing. C5-C6: Small posterior disc osteophyte complex. No spinal canal stenosis or neural foraminal narrowing. C6-C7: Small posterior disc osteophyte complex.  No spinal canal stenosis or neural foraminal narrowing. C7-T1: No significant disc herniation. No spinal canal stenosis or neural foraminal narrowing. IMPRESSION: 1. No spinal canal stenosis or neural foraminal narrowing in the cervical spine. Electronically signed by: Glendia Molt MD 04/29/2024 07:18 PM EST RP Workstation: [id number]   MR MRV HEAD WO CM Result Date: 04/29/2024 EXAM: MRV BRAIN 04/29/2024 07:04:00 PM TECHNIQUE: Multiplanar multisequence MRV of the head was performed without the administration of intravenous contrast. Multiplanar 2D and 3D reformatted images are provided for review. COMPARISON: CT Head 04/21/17 CLINICAL HISTORY: Neck trauma dizziness, headache, neck pain, vision changes with right eye double vision and significant headache. Syncope. FINDINGS: No focal stenosis or thrombus is seen of the major dural venous sinuses. IMPRESSION: 1. Unremarkable MRV of the head. Electronically signed by: Glendia Molt MD 04/29/2024 07:15 PM EST RP Workstation: [id number]   MR BRAIN WO CONTRAST Result Date: 04/29/2024 EXAM: MRI BRAIN WITHOUT CONTRAST 04/29/2024 07:04:00 PM TECHNIQUE: Multiplanar multisequence MRI of the head/brain was performed without the administration of intravenous contrast. COMPARISON: None available. CLINICAL HISTORY: Neck trauma dizziness, headache, neck pain, vision changes with right eye double vision and significant headache. Syncope. FINDINGS: BRAIN AND VENTRICLES: No acute infarct. No intracranial hemorrhage. No mass. No midline shift. No hydrocephalus. The sella is unremarkable. Normal flow voids. ORBITS: No significant abnormality. SINUSES AND MASTOIDS: No significant abnormality. BONES AND SOFT TISSUES: Normal marrow signal. No soft tissue abnormality. IMPRESSION: 1. No acute abnormality. Electronically signed by: Glendia Molt MD  04/29/2024 07:12 PM EST RP Workstation: [id number]    Microbiology: Results for orders placed or performed during the hospital encounter of  04/29/24  Urine Culture     Status: Abnormal   Collection Time: 04/29/24 10:54 PM   Specimen: Urine, Clean Catch  Result Value Ref Range Status   Specimen Description URINE, CLEAN CATCH  Final   Special Requests   Final    NONE Performed at [facility], [address] [place], [place] [address]    Culture (A)  Final    50,000 COLONIES/mL MULTIPLE SPECIES PRESENT, SUGGEST RECOLLECTION   Report Status 05/01/2024 FINAL  Final    Labs: CBC: Recent Labs  Lab 04/29/24 1953 04/30/24 0616  WBC 13.1* 11.3*  NEUTROABS 8.4*  --   HGB 11.7* 10.6*  HCT 34.8* 30.9*  MCV 88.5 87.0  PLT 241 215   Basic Metabolic Panel: Recent Labs  Lab 04/29/24 1953 04/30/24 0616 05/01/24 0155  NA 135 137 135  K 3.8 3.6 3.7  CL 102 108 102  CO2 20* 20* 22  GLUCOSE 84 83 99  BUN <5* <5* <5*  CREATININE 0.46 0.45 0.39*  CALCIUM 9.4 8.6* 8.6*  MG 1.9  --  1.5*   Liver Function Tests: Recent Labs  Lab 04/29/24 1953 04/30/24 0616  AST 38 24  ALT 24 20  ALKPHOS 33* 27*  BILITOT 0.3 <0.2  PROT 6.6 5.6*  ALBUMIN 3.9 3.4*   CBG: No results for input(s): GLUCAP in the last 168 hours.  Discharge time spent: greater than 30 minutes.  Signed: AIDA CHO, MD [facility] 05/01/2024 "

## 2024-05-01 NOTE — Progress Notes (Signed)
 Physical Therapy Treatment  Patient Details Name: Deborah Gibbs MRN: [id number] DOB: [birth year] Today's Date: 05/01/2024   History of Present Illness Pt is a 34 y/o female who presents 04/29/2024 s/p syncope and collapse at home ~2 days PTA. Pt hit her head with fall and with residual headaches, dizziness and blurred vision. OB directed her to ED for further assessment. MRI/MRV head and C-Spine negative for acute changes. PMH significant for Prior back surgery, DOE, anxiety.    PT Comments  Pt reports feeling worse today regarding headache and dizziness. Noted that today, pt with clear start and end to vertigo episodes, vs yesterday's initial evaluation with reports of more consistent vertigo. Nystagmus difficult to visualize due to squinting and poor tolerance to light and maintaining eyes open. Horizontal canals tested - Mild positive on R; Posterior canals tested - significant positive on the R. 2 rounds of Epley maneuver performed for R posterior canal. Pt reports on second round vertigo was less intense. Will continue to follow and progress as able per POC.    If plan is discharge home, recommend the following: A little help with walking and/or transfers;A little help with bathing/dressing/bathroom;Assistance with cooking/housework;Assist for transportation;Help with stairs or ramp for entrance   Can travel by private vehicle        Equipment Recommendations  None recommended by PT    Recommendations for Other Services OT consult     Precautions / Restrictions Precautions Precautions: Fall Recall of Precautions/Restrictions: Intact Precaution/Restrictions Comments: Watch BP Restrictions Weight Bearing Restrictions Per Provider Order: No     Mobility  Bed Mobility Overal bed mobility: Modified Independent             General bed mobility comments: modified independent - education on gaze fixation prior to movement    Transfers Overall transfer level: Needs  assistance Equipment used: None Transfers: Sit to/from Stand Sit to Stand: Contact guard assist   Step pivot transfers: Contact guard assist       General transfer comment: Slow rise to stand due to headache/dizziness. Husband with hands on guarding. Pt initially stood and had to sit back down due to dizziness. On second attempt pt educated to fix gaze on object and hold eyes there while she stood.    Ambulation/Gait Ambulation/Gait assistance: Contact guard assist Gait Distance (Feet): 30 Feet Assistive device: None Gait Pattern/deviations: Step-through pattern, Decreased stride length, Wide base of support Gait velocity: Decreased Gait velocity interpretation: <1.31 ft/sec, indicative of household ambulator   General Gait Details: To/from bathroom. Pt was educated on gaze stabilization techniques vs scanning room. Pt following cues well and able to navigate around room slowly but without unsteadiness or LOB.   Stairs             Wheelchair Mobility     Tilt Bed    Modified Rankin (Stroke Patients Only)       Balance Overall balance assessment: Mild deficits observed, not formally tested                                          Communication Communication Communication: No apparent difficulties  Cognition Arousal: Alert Behavior During Therapy: WFL for tasks assessed/performed   PT - Cognitive impairments: No apparent impairments                       PT - Cognition Comments: Having  some amnesia of the events before and after the syncope. Remembers getting up to use the bathroom and then does not remember how she got out of the bathroom to their living room or to the hospital after. Reports that she feels she has had difficulty with word finding and slow processing since the fall. Following commands: Intact      Cueing Cueing Techniques: Verbal cues, Gestural cues  Exercises      General Comments        Pertinent  Vitals/Pain Pain Assessment Pain Assessment: Faces Faces Pain Scale: Hurts whole lot Pain Location: R side of head Pain Descriptors / Indicators: Headache Pain Intervention(s): Limited activity within patient's tolerance, Monitored during session, Repositioned    Home Living                          Prior Function            PT Goals (current goals can now be found in the care plan section) Acute Rehab PT Goals Patient Stated Goal: Be able to return home PT Goal Formulation: With patient/family Time For Goal Achievement: 05/07/24 Potential to Achieve Goals: Good Progress towards PT goals: Progressing toward goals    Frequency    Min 2X/week      PT Plan      Co-evaluation              AM-PAC PT 6 Clicks Mobility   Outcome Measure  Help needed turning from your back to your side while in a flat bed without using bedrails?: None Help needed moving from lying on your back to sitting on the side of a flat bed without using bedrails?: None Help needed moving to and from a bed to a chair (including a wheelchair)?: A Little Help needed standing up from a chair using your arms (e.g., wheelchair or bedside chair)?: A Little Help needed to walk in hospital room?: A Little Help needed climbing 3-5 steps with a railing? : A Little 6 Click Score: 20    End of Session Equipment Utilized During Treatment: Gait belt Activity Tolerance: Patient limited by pain Patient left: in bed;with call bell/phone within reach Nurse Communication: Mobility status PT Visit Diagnosis: Difficulty in walking, not elsewhere classified (R26.2);Pain;Other symptoms and signs involving the nervous system (R29.898) Pain - part of body:  (head)     Time: 8859-8786 PT Time Calculation (min) (ACUTE ONLY): 33 min  Charges:    $Therapeutic Activity: 8-22 mins $Canalith Rep Proc: 8-22 mins PT General Charges $$ ACUTE PT VISIT: 1 Visit                     Leita Sable, PT,  DPT Acute Rehabilitation Services Secure Chat Preferred Office: [phone]    Leita JONETTA Sable 05/01/2024, 2:34 PM

## 2024-05-01 NOTE — Progress Notes (Incomplete)
 "    Progress Note    Deborah Gibbs  FMW:[id number] DOB: Jan 12, 1991  DOA: 04/29/2024 PCP: Caleen Dirks, MD      Brief Narrative:    Medical records reviewed and are as summarized below:  Deborah Gibbs is a 34 y.o. female ***    Brief Narrative:   34 year old with history of depression, anxiety who is [redacted] weeks pregnant comes to the hospital with lightheadedness, dizziness.  Patient states she had a syncopal episode 2 days prior to admission and since then she has has been having diffuse headache, blurry vision and lightheadedness.  She called her OB/GYN office who directed to come to the hospital.  Workup including MRI brain, MRV head and C-spine are unremarkable.  TSH, cortisol are negative.  Assessment & Plan:  Headache, dizziness Concussion, moderate to severe - After fall couple of days ago.  MRI brain, cervical spine and MRV of head are unremarkable.  TSH, cortisol are normal.  Blood pressure occasionally slightly soft but expected during pregnancy.  Will obtain echocardiogram.  This is a likely concussion and should improve with time.  For now pain control.  I discussed with Dr. Larraine Sharps who is okay with treating this with Tylenol , tramadol  and oxycodone .  We can add muscle relaxer if necessary.  Their service will follow-up outpatient in 1 week's time. -Continue IV fluids -Will have PT see her to make sure she is steady  Depression/anxiety - Does not seem to be any home medications?  Likely during pregnancy.  Record from several months ago shows she used to be on Vistaril  and Prozac   Pregnancy, 17 weeks - Discussed with [facility], Dr. Sharps.  She is aware of this patient.  They will arrange for outpatient follow-up in 1 week's time but will be available for any questions and management as needed   DVT prophylaxis: SCDs Start: 04/30/24 0100    Code Status: Full Code Family Communication:   Continue hospital stay for at least next 24 hours   PT Follow up Recs:    Subjective: Still having quite a bit of unsteady gait, severe headache, some diplopia and blurry vision.  Denies any nausea and vomiting  Tells me her fall was on 1/28 and had episode of amnesia lasting for about 3 hours.   Examination:  General exam: Appears calm and comfortable  Respiratory system: Clear to auscultation. Respiratory effort normal. Cardiovascular system: S1 & S2 heard, RRR. No JVD, murmurs, rubs, gallops or clicks. No pedal edema. Gastrointestinal system: Abdomen is nondistended, soft and nontender. No organomegaly or masses felt. Normal bowel sounds heard. Central nervous system: Alert and oriented. No focal neurological deficits. Extremities: Symmetric 5 x 5 power. Skin: No rashes, lesions or ulcers Psychiatry: Judgement and insight appear normal. Mood & affect appropriate.       Assessment/Plan:   Principal Problem:   Syncope          There is no height or weight on file to calculate BMI.         Diet Order             Diet regular Room service appropriate? Yes; Fluid consistency: Thin  Diet effective now                       {PT Orders Active  (Optional):1} {No Pt Follow Up1/22/2026 1458 (Optional):1}          Consultants: ***  Procedures: ***    Medications:  Continuous Infusions:   Anti-infectives (From admission, onward)    Start     Dose/Rate Route Frequency Ordered Stop   04/30/24 0015  nitrofurantoin  (macrocrystal-monohydrate) (MACROBID ) capsule 100 mg        100 mg Oral  Once 04/30/24 0002 04/30/24 0050              Family Communication/Anticipated D/C date and plan/Code Status   DVT prophylaxis: SCDs Start: 04/30/24 0100     Code Status: Full Code  Family Communication: *** Disposition Plan: ***   Status is: Inpatient {Inpatient:23812}       Subjective:   ***  Objective:    Vitals:   04/30/24 1931 04/30/24 2310 05/01/24 0456 05/01/24 0808  BP: 101/65  104/65 (!) 91/54 (!) 93/55  Pulse: 76 85 82 77  Resp: 18 18 17 17   Temp: 98.9 F (37.2 C) 98 F (36.7 C) 97.9 F (36.6 C) 98.3 F (36.8 C)  TempSrc: Oral Oral Oral Oral  SpO2: 99% 99% 100% 100%   Orthostatic VS for the past 24 hrs:  BP- Sitting Pulse- Sitting BP- Standing at 0 minutes Pulse- Standing at 0 minutes  04/30/24 1438 106/66 78 108/65 90     Intake/Output Summary (Last 24 hours) at 05/01/2024 1128 Last data filed at 05/01/2024 1018 Gross per 24 hour  Intake 2212.57 ml  Output 3450 ml  Net -1237.43 ml   There were no vitals filed for this visit.  Exam:  ***       Data Reviewed:   I have personally reviewed following labs and imaging studies:  Labs: Labs show the following:   Basic Metabolic Panel: Recent Labs  Lab 04/29/24 1953 04/30/24 0616 05/01/24 0155  NA 135 137 135  K 3.8 3.6 3.7  CL 102 108 102  CO2 20* 20* 22  GLUCOSE 84 83 99  BUN <5* <5* <5*  CREATININE 0.46 0.45 0.39*  CALCIUM 9.4 8.6* 8.6*  MG 1.9  --  1.5*   GFR CrCl cannot be calculated (Unknown ideal weight.). Liver Function Tests: Recent Labs  Lab 04/29/24 1953 04/30/24 0616  AST 38 24  ALT 24 20  ALKPHOS 33* 27*  BILITOT 0.3 <0.2  PROT 6.6 5.6*  ALBUMIN 3.9 3.4*   No results for input(s): LIPASE, AMYLASE in the last 168 hours. No results for input(s): AMMONIA in the last 168 hours. Coagulation profile No results for input(s): INR, PROTIME in the last 168 hours.  CBC: Recent Labs  Lab 04/29/24 1953 04/30/24 0616  WBC 13.1* 11.3*  NEUTROABS 8.4*  --   HGB 11.7* 10.6*  HCT 34.8* 30.9*  MCV 88.5 87.0  PLT 241 215   Cardiac Enzymes: No results for input(s): CKTOTAL, CKMB, CKMBINDEX, TROPONINI in the last 168 hours. BNP (last 3 results) Recent Labs    04/29/24 1953  PROBNP <50.0   CBG: No results for input(s): GLUCAP in the last 168 hours. D-Dimer: No results for input(s): DDIMER in the last 72 hours. Hgb A1c: No results for  input(s): HGBA1C in the last 72 hours. Lipid Profile: No results for input(s): CHOL, HDL, LDLCALC, TRIG, CHOLHDL, LDLDIRECT in the last 72 hours. Thyroid function studies: Recent Labs    04/30/24 0616  TSH 1.480   Anemia work up: No results for input(s): VITAMINB12, FOLATE, FERRITIN, TIBC, IRON, RETICCTPCT in the last 72 hours. Sepsis Labs: Recent Labs  Lab 04/29/24 1953 04/30/24 0616  WBC 13.1* 11.3*    Microbiology Recent Results (from the past 240 hours)  Urine Culture     Status: Abnormal   Collection Time: 04/29/24 10:54 PM   Specimen: Urine, Clean Catch  Result Value Ref Range Status   Specimen Description URINE, CLEAN CATCH  Final   Special Requests   Final    NONE Performed at [facility], [address] [place], [place] [address]    Culture (A)  Final    50,000 COLONIES/mL MULTIPLE SPECIES PRESENT, SUGGEST RECOLLECTION   Report Status 05/01/2024 FINAL  Final    Procedures and diagnostic studies:  ECHOCARDIOGRAM COMPLETE Result Date: 04/30/2024    ECHOCARDIOGRAM REPORT   Patient Name:   ADITRI LOUISCHARLES Date of Exam: 04/30/2024 Medical Rec #:  [id number]    Height:       63.0 in Accession #:    [id number]   Weight:       199.3 lb Date of Birth:  04-16-1990    BSA:          1.931 m Patient Age:    33 years     BP:           97/62 mmHg Patient Gender: F            HR:           64 bpm. Exam Location:  Inpatient Procedure: 2D Echo, 3D Echo, Cardiac Doppler, Color Doppler and Strain Analysis            (Both Spectral and Color Flow Doppler were utilized during            procedure). Indications:    I42.9 Cardiomyopathy (unspecified)  History:        Patient has no prior history of Echocardiogram examinations.                 Signs/Symptoms:Dizziness/Lightheadedness, Syncope, Shortness of                 Breath and Dyspnea.  Sonographer:    Ellouise Mose RDCS Referring Phys: [id number] BURGESS JAYSON DARE  Sonographer Comments: Patient is [redacted]weeks pregnant.  IMPRESSIONS  1. Left ventricular ejection fraction, by estimation, is 55 to 60%. Left ventricular ejection fraction by 3D volume is 59 %. The left ventricle has normal function. The left ventricle has no regional wall motion abnormalities. Left ventricular diastolic  parameters were normal. The average left ventricular global longitudinal strain is -21.3 %. The global longitudinal strain is normal.  2. Right ventricular systolic function is normal. The right ventricular size is normal. The estimated right ventricular systolic pressure is 12.0 mmHg.  3. The mitral valve is normal in structure. No evidence of mitral valve regurgitation. No evidence of mitral stenosis.  4. The aortic valve is tricuspid. Aortic valve regurgitation is not visualized. No aortic stenosis is present.  5. The inferior vena cava is normal in size with greater than 50% respiratory variability, suggesting right atrial pressure of 3 mmHg.  6. Cannot exclude a small PFO. Comparison(s): No prior Echocardiogram. FINDINGS  Left Ventricle: Left ventricular ejection fraction, by estimation, is 55 to 60%. Left ventricular ejection fraction by 3D volume is 59 %. The left ventricle has normal function. The left ventricle has no regional wall motion abnormalities. The average left ventricular global longitudinal strain is -21.3 %. Strain was performed and the global longitudinal strain is normal. The left ventricular internal cavity size was normal in size. There is no left ventricular hypertrophy. Left ventricular diastolic parameters were normal. Right Ventricle: The right ventricular size is normal. No  increase in right ventricular wall thickness. Right ventricular systolic function is normal. The tricuspid regurgitant velocity is 1.50 m/s, and with an assumed right atrial pressure of 3 mmHg, the estimated right ventricular systolic pressure is 12.0 mmHg. Left Atrium: Left atrial size was normal in size. Right Atrium: Right atrial size was normal in  size. Pericardium: There is no evidence of pericardial effusion. Mitral Valve: The mitral valve is normal in structure. No evidence of mitral valve regurgitation. No evidence of mitral valve stenosis. Tricuspid Valve: The tricuspid valve is normal in structure. Tricuspid valve regurgitation is trivial. No evidence of tricuspid stenosis. Aortic Valve: The aortic valve is tricuspid. Aortic valve regurgitation is not visualized. No aortic stenosis is present. Pulmonic Valve: The pulmonic valve was normal in structure. Pulmonic valve regurgitation is not visualized. No evidence of pulmonic stenosis. Aorta: The aortic root and ascending aorta are structurally normal, with no evidence of dilitation. Venous: The inferior vena cava is normal in size with greater than 50% respiratory variability, suggesting right atrial pressure of 3 mmHg. IAS/Shunts: Cannot exclude a small PFO. Additional Comments: 3D was performed not requiring image post processing on an independent workstation and was normal.  LEFT VENTRICLE PLAX 2D LVIDd:         4.80 cm         Diastology LVIDs:         2.80 cm         LV e' medial:    11.10 cm/s LV PW:         1.20 cm         LV E/e' medial:  7.5 LV IVS:        1.00 cm         LV e' lateral:   20.50 cm/s LVOT diam:     2.14 cm         LV E/e' lateral: 4.0 LV SV:         72 LV SV Index:   37              2D Longitudinal LVOT Area:     3.60 cm        Strain LV IVRT:       81 msec         2D Strain GLS   -21.3 %                                Avg:  LV Volumes (MOD)               3D Volume EF LV vol d, MOD    110.0 ml      LV 3D EF:    Left A2C:                                        ventricul LV vol d, MOD    74.6 ml                    ar A4C:                                        ejection LV vol s, MOD    43.4 ml  fraction A2C:                                        by 3D LV vol s, MOD    26.8 ml                    volume is A4C:                                        59 %. LV SV MOD  A2C:   66.6 ml LV SV MOD A4C:   74.6 ml LV SV MOD BP:    58.7 ml       3D Volume EF:                                3D EF:        59 %                                LV EDV:       156 ml                                LV ESV:       65 ml                                LV SV:        92 ml RIGHT VENTRICLE             IVC RV S prime:     15.10 cm/s  IVC diam: 1.75 cm TAPSE (M-mode): 2.3 cm                             PULMONARY VEINS                             Diastolic Velocity: 43.80 cm/s                             S/D Velocity:       1.50                             Systolic Velocity:  64.50 cm/s LEFT ATRIUM           Index       RIGHT ATRIUM          Index LA diam:      3.14 cm 1.63 cm/m  RA Area:     9.03 cm LA Vol (A2C): 7.0 ml  3.63 ml/m  RA Volume:   14.00 ml 7.25 ml/m LA Vol (A4C): 16.9 ml 8.75 ml/m  AORTIC VALVE LVOT Vmax:   112.00 cm/s LVOT Vmean:  66.000 cm/s LVOT VTI:    0.201 m  AORTA Ao Root diam: 3.09 cm Ao Asc diam:  3.40 cm MITRAL VALVE  TRICUSPID VALVE MV Area (PHT): 3.15 cm    TR Peak grad:   9.0 mmHg MV Decel Time: 241 msec    TR Vmax:        150.00 cm/s MV E velocity: 82.80 cm/s MV A velocity: 69.70 cm/s  SHUNTS MV E/A ratio:  1.19        Systemic VTI:  0.20 m                            Systemic Diam: 2.14 cm Sunit Tolia Electronically signed by Madonna Large Signature Date/Time: 04/30/2024/11:17:46 AM    Final    MR Cervical Spine Wo Contrast Result Date: 04/29/2024 EXAM: MRI CERVICAL SPINE WITHOUT CONTRAST 04/29/2024 07:04:00 PM TECHNIQUE: Multiplanar multisequence MRI of the cervical spine was performed. COMPARISON: None available. CLINICAL HISTORY: Neck trauma dizziness, headache, neck pain, vision changes with right eye double vision and significant headache. Syncope. Neurology recommended MRI brain, MRV head, and MR C-spine without contrast. FINDINGS: BONES AND ALIGNMENT: Normal alignment. Normal vertebral body heights. Bone marrow signal is unremarkable. SPINAL CORD:  Normal spinal cord size. No abnormal spinal cord signal. SOFT TISSUES: No paraspinal mass. C2-C3: No significant disc herniation. No spinal canal stenosis or neural foraminal narrowing. C3-C4: No significant disc herniation. No spinal canal stenosis or neural foraminal narrowing. C4-C5: No significant disc herniation. Left uncovertebral hypertrophy. No spinal canal stenosis or neural foraminal narrowing. C5-C6: Small posterior disc osteophyte complex. No spinal canal stenosis or neural foraminal narrowing. C6-C7: Small posterior disc osteophyte complex. No spinal canal stenosis or neural foraminal narrowing. C7-T1: No significant disc herniation. No spinal canal stenosis or neural foraminal narrowing. IMPRESSION: 1. No spinal canal stenosis or neural foraminal narrowing in the cervical spine. Electronically signed by: Glendia Molt MD 04/29/2024 07:18 PM EST RP Workstation: [id number]   MR MRV HEAD WO CM Result Date: 04/29/2024 EXAM: MRV BRAIN 04/29/2024 07:04:00 PM TECHNIQUE: Multiplanar multisequence MRV of the head was performed without the administration of intravenous contrast. Multiplanar 2D and 3D reformatted images are provided for review. COMPARISON: CT Head 04/21/17 CLINICAL HISTORY: Neck trauma dizziness, headache, neck pain, vision changes with right eye double vision and significant headache. Syncope. FINDINGS: No focal stenosis or thrombus is seen of the major dural venous sinuses. IMPRESSION: 1. Unremarkable MRV of the head. Electronically signed by: Glendia Molt MD 04/29/2024 07:15 PM EST RP Workstation: [id number]   MR BRAIN WO CONTRAST Result Date: 04/29/2024 EXAM: MRI BRAIN WITHOUT CONTRAST 04/29/2024 07:04:00 PM TECHNIQUE: Multiplanar multisequence MRI of the head/brain was performed without the administration of intravenous contrast. COMPARISON: None available. CLINICAL HISTORY: Neck trauma dizziness, headache, neck pain, vision changes with right eye double vision and significant headache.  Syncope. FINDINGS: BRAIN AND VENTRICLES: No acute infarct. No intracranial hemorrhage. No mass. No midline shift. No hydrocephalus. The sella is unremarkable. Normal flow voids. ORBITS: No significant abnormality. SINUSES AND MASTOIDS: No significant abnormality. BONES AND SOFT TISSUES: Normal marrow signal. No soft tissue abnormality. IMPRESSION: 1. No acute abnormality. Electronically signed by: Glendia Molt MD 04/29/2024 07:12 PM EST RP Workstation: [id number]               LOS: 1 day   Yiselle Babich  [facility]   Pager on [email or web address]. If 7PM-7AM, please contact night-coverage at [email or web address]     05/01/2024, 11:28 AM           "

## 2024-05-01 NOTE — Progress Notes (Signed)
" °   05/01/24 1829  Departure Condition  Departure Condition Good  Mobility at [employer]  Patient/Caregiver Teaching Teach Back Method Used;Discharge instructions reviewed;Prescriptions reviewed;Follow-up care reviewed;Patient/caregiver verbalized understanding;Medications discussed;Pain management discussed;Educated about hypertension in pregnancy;Admission discussed  Departure Mode With significant other   Patient alert and oriented x4. Pain and VS stable at this time.  "

## 2024-05-01 NOTE — TOC CM/SW Note (Signed)
 TOC received call for DME. MD has ordered a 3 in 1 [facility] for the patient. CM verified patient's address with RN and explained that equipment will need to be delivered to patient's home.   CM contacted [employer]'s after hours service department at [phone]. Order information given, staff will relay information to their on call driver.   CM faxed order and clinicals to [employer] at [phone].   No further CM needs reported at this time.   Merilee Batty, MSN, RN Case Management [phone]

## 2024-05-01 NOTE — Progress Notes (Signed)
 "  05/01/24 1551  PT Visit Information  Last PT Received On 05/01/24  Assistance Needed +1  History of Present Illness Pt is a 34 y/o female who presents 04/29/2024 s/p syncope and collapse at home ~2 days PTA. Pt hit her head with fall and with residual headaches, dizziness and blurred vision. OB directed her to ED for further assessment. MRI/MRV head and C-Spine negative for acute changes. PMH significant for Prior back surgery, DOE, anxiety.  Subjective Data  Patient Stated Goal Be able to return home  Precautions  Precautions Fall  Recall of Precautions/Restrictions Intact  Precaution/Restrictions Comments Watch BP  Restrictions  Weight Bearing Restrictions Per Provider Order No  Pain Assessment  Pain Assessment Faces  Faces Pain Scale 6  Pain Location R side of head  Pain Descriptors / Indicators Headache  Pain Intervention(s) Limited activity within patient's tolerance;Monitored during session;Repositioned  Cognition  Arousal Alert  Behavior During Therapy WFL for tasks assessed/performed  PT - Cognitive impairments No apparent impairments  PT - Cognition Comments Having some amnesia of the events before and after the syncope. Remembers getting up to use the bathroom and then does not remember how she got out of the bathroom to their living room or to the hospital after. Reports that she feels she has had difficulty with word finding and slow processing since the fall.  Following Commands  Following commands Intact  Cueing  Cueing Techniques Verbal cues;Gestural cues  Communication  Communication No apparent difficulties  Bed Mobility  Overal bed mobility Modified Independent  General bed mobility comments modified independent - education on gaze fixation prior to movement  Transfers  Overall transfer level Needs assistance  Equipment used None  Transfers Sit to/from Stand  Sit to Stand Contact guard assist  Step pivot transfers Contact guard assist  General transfer  comment Slow rise to stand due to headache/dizziness. Husband with hands on guarding. Pt educated to fix gaze on object and hold eyes there while she stood. Also educated to turn eyes, then head then body with pt demonstrating ability to do so.  Ambulation/Gait  Ambulation/Gait assistance Contact guard assist  Gait Distance (Feet) 20 Feet  Assistive device None;1 person hand held assist  Gait Pattern/deviations Step-through pattern;Decreased stride length;Wide base of support  General Gait Details Pt with continued education on gaze stabilization techniques vs scanning room. Pt following cues well and able to navigate around room slowly but without unsteadiness or LOB.  Gait velocity Decreased  Gait velocity interpretation <1.31 ft/sec, indicative of household ambulator  Balance  Overall balance assessment Mild deficits observed, not formally tested  General Comments  General comments (skin integrity, edema, etc.) Pt positive for right anterior canal BPPV and treated via Epley maneuver. Pt continues to report improvement in vertigo with each treatment.  Exercises  Exercises Other exercises  Other Exercises  Other Exercises Access Code: [email or web address]  URL: [email or web address]  Date: 05/01/2024  Prepared by: Stephane    Exercises  - Self-Epley Maneuver Right Ear  - 2 x daily - 7 x weekly - 1 sets - 10 reps  - Self-Epley Maneuver Left Ear  - 2 x daily - 7 x weekly - 1 sets - 10 reps  - Brandt-Daroff Vestibular Exercise  - 2 x daily - 7 x weekly - 1 sets - 5 reps  - Rolling rightleft sides for vestibular habituation  - 2 x daily - 7 x weekly - 1 sets - 10 reps    Patient Education  - What  Is BPPV?  - BPPV - Thorough education with pt and husband regarding exercises.  PT - End of Session  Equipment Utilized During Treatment Gait belt  Activity Tolerance Patient limited by pain  Patient left with call bell/phone within reach;in chair;with family/visitor present  Nurse Communication Mobility  status   PT - Assessment/Plan  PT Visit Diagnosis Difficulty in walking, not elsewhere classified (R26.2);Pain;Other symptoms and signs involving the nervous system (R29.898)  Pain - part of body  (head)  PT Frequency (ACUTE ONLY) Min 2X/week  Follow Up Recommendations Home health PT (Vestibular rehab)  Patient can return home with the following A little help with walking and/or transfers;A little help with bathing/dressing/bathroom;Assistance with cooking/housework;Assist for transportation;Help with stairs or ramp for entrance  PT equipment Other (comment) (nurse obtaining gait belt)  AM-PAC PT 6 Clicks Mobility Outcome Measure (Version 2)  Help needed turning from your back to your side while in a flat bed without using bedrails? 4  Help needed moving from lying on your back to sitting on the side of a flat bed without using bedrails? 4  Help needed moving to and from a bed to a chair (including a wheelchair)? 3  Help needed standing up from a chair using your arms (e.g., wheelchair or bedside chair)? 3  Help needed to walk in hospital room? 3  Help needed climbing 3-5 steps with a railing?  3  6 Click Score 20  Consider Recommendation of Discharge To: Home with no services  Progressive Mobility  What is the highest level of mobility based on the mobility assessment? Level 4 (Ambulates with assistance) - Balance while stepping forward/back - Complete  Mobility Referral No  Activity Ambulated with assistance  PT Goal Progression  Progress towards PT goals Progressing toward goals  PT Time Calculation  PT Start Time (ACUTE ONLY) 1404  PT Stop Time (ACUTE ONLY) 1442  PT Time Calculation (min) (ACUTE ONLY) 38 min  PT General Charges  $$ ACUTE PT VISIT 1 Visit  PT Treatments  $Gait Training 8-22 mins  $Therapeutic Exercise 8-22 mins  $Canalith Rep Proc 8-22 mins  Pt was able to tolerate Epley maneuver for left anterior canal BPPV with report of decr dizziness. Walked to chair to  sit up and eat lunch.  Husband and pt educated regarding Concussion handout and BPPV exercise program. Also taught pt compensatory techniques.  Discussed need for HHPT vestibular rehab.  Pt and husband agree. Nurse ordered gait belt.  Owenn Rothermel M,PT Acute Rehab Services [phone]  "

## 2024-05-01 NOTE — Evaluation (Signed)
 Occupational Therapy Evaluation Patient Details Name: Deborah Gibbs MRN: [id number] DOB: March 10, 1991 Today's Date: 05/01/2024   History of Present Illness   Pt is a 34 y/o female who presents 04/29/2024 s/p syncope and collapse at home ~2 days PTA. Pt hit her head with fall and with residual headaches, dizziness and blurred vision. OB directed her to ED for further assessment. MRI/MRV head and C-Spine negative for acute changes. PMH significant for Prior back surgery, DOE, anxiety.     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 Pt with significant concussive symptoms as noted below and will benefit from follow up with OT at a neuro outpt center as well as follow up with an ambulatory referral to the concussion clinic. Discussed need for her to follow up with her eye doctor as well. Plan to see again today to assess use of partial occlusion to improve functional vision.      If plan is discharge home, recommend the following:   A little help with walking and/or transfers;A little help with bathing/dressing/bathroom;Assistance with cooking/housework;Assist for transportation;Direct supervision/assist for medications management     Functional Status Assessment   Patient has had a recent decline in their functional status and demonstrates the ability to make significant improvements in function in a reasonable and predictable amount of time.     Equipment Recommendations   BSC/3in1     Recommendations for Other Services         Precautions/Restrictions   Precautions Precautions: Fall Recall of Precautions/Restrictions: Intact Precaution/Restrictions Comments: Watch BP Restrictions Weight Bearing Restrictions Per Provider Order: No     Mobility Bed Mobility Overal bed mobility: Modified Independent             General bed mobility comments: modified independent - education on gaze  fixation prior to movement    Transfers Overall transfer level: Needs assistance   Transfers: Sit to/from Stand, Bed to chair/wheelchair/BSC Sit to Stand: Contact guard assist     Step pivot transfers: Contact guard assist (husband assisting)            Balance Overall balance assessment: Mild deficits observed, not formally tested                                         ADL either performed or assessed with clinical judgement   ADL Overall ADL's : Needs assistance/impaired                                     Functional mobility during ADLs: Contact guard assist General ADL Comments: able to complete ADL tasks with set up; CGA for BSC transfers     Vision Baseline Vision/History: 1 Wears glasses Ability to See in Adequate Light: 2 Moderately impaired Patient Visual Report: Diplopia;Blurring of vision;Eye fatigue/eye pain/headache;Nausea/blurring vision with head movement;Central vision impairment;Peripheral vision impairment Vision Assessment?: Yes Eye Alignment: Impaired (comment) Ocular Range of Motion: Restricted looking up;Restricted looking down Alignment/Gaze Preference: Within Defined Limits Tracking/Visual Pursuits: Decreased smoothness of horizontal tracking;Decreased smoothness of vertical tracking Saccades: Additional eye shifts occurred during testing;Additional head turns occurred during testing;Decreased speed of saccadic movement Convergence: Impaired (comment) Diplopia Assessment: Objects split side to side;Present in near gaze;Present in far gaze;Other (comment) (improves with R gaze) Depth Perception: Undershoots  Additional Comments: Appears to have a dysconjugate gaze; significatn difficulty with visual attention/stabilization on target; complains ot diplpia with R eye; blurred vision L eye; diplopia improves with partical occlusion R eye - will further assess; encouraged pt to purchase FL 41 lenses to assist with eye  strain and photophobia     Perception         Praxis         Pertinent Vitals/Pain Pain Assessment Pain Assessment: 0-10 Pain Score: 10-Worst pain ever Pain Location: R side of head Pain Descriptors / Indicators: Headache Pain Intervention(s): Limited activity within patient's tolerance, Premedicated before session     Extremity/Trunk Assessment Upper Extremity Assessment Upper Extremity Assessment: Overall WFL for tasks assessed       Cervical / Trunk Assessment Cervical / Trunk Assessment: Normal   Communication Communication Communication: No apparent difficulties   Cognition Arousal: Alert Behavior During Therapy: WFL for tasks assessed/performed Cognition: Cognition impaired       Memory impairment (select all impairments): Working memory Attention impairment (select first level of impairment): Selective attention   OT - Cognition Comments: also related to visual deficits                 Following commands: Intact       Cueing  General Comments   Cueing Techniques: Verbal cues;Gestural cues      Exercises Exercises: Other exercises Other Exercises Other Exercises: eye stretches in all directions Other Exercises: visual fixation x 10-15 each eye Other Exercises: horizaontal tracking x 5 each eye separately   Shoulder Instructions      Home Living Family/patient expects to be discharged to:: Private residence Living Arrangements: Spouse/significant other Available Help at Discharge: Family;Available 24 hours/day Type of Home: House Home Access: Stairs to enter [employer] of Steps: 5 Entrance Stairs-Rails: Right;Left;Can reach both Home Layout: One level     Bathroom Shower/Tub: Chief Strategy Officer: Standard     Home Equipment: None          Prior Functioning/Environment Prior Level of Function : Independent/Modified Independent;Driving;Working/employed             Mobility CommentsDevelopment Worker, [employer]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      OT Problem List: Decreased strength;Decreased range of motion;Decreased activity tolerance;Impaired balance (sitting and/or standing);Impaired vision/perception;Decreased coordination;Decreased cognition;Decreased safety awareness;Pain   OT Treatment/Interventions: Self-care/ADL training;Therapeutic exercise;Neuromuscular education;DME and/or AE instruction;Therapeutic activities;Visual/perceptual remediation/compensation;Cognitive remediation/compensation;Patient/family education;Balance training      OT Goals(Current goals can be found in the care plan section)   Acute Rehab OT Goals Patient Stated Goal: get better OT Goal Formulation: With patient Time For Goal Achievement: 05/15/24 Potential to Achieve Goals: Good   OT Frequency:  Min 2X/week    Co-evaluation              AM-PAC OT 6 Clicks Daily Activity     Outcome Measure Help from another person eating meals?: None Help from another person taking care of personal grooming?: A Little Help from another person toileting, which includes using toliet, bedpan, or urinal?: A Little Help from another person bathing (including washing, rinsing, drying)?: A Little Help from another person to put on and taking off regular upper body clothing?: A Little Help from another person to put on and taking off regular lower body clothing?: A Little 6 Click Score: 19   End of Session Nurse Communication: Mobility status;Other (comment) (DC needs)  Activity Tolerance: Patient tolerated treatment well Patient left: in bed;with call bell/phone within reach;with  family/visitor present  OT Visit Diagnosis: Unsteadiness on feet (R26.81);Muscle weakness (generalized) (M62.81);History of falling (Z91.81);Low vision, both eyes (H54.2);Other symptoms and signs involving cognitive function;Dizziness and giddiness (R42);Pain Pain - part of body:  (head)                Time: 9053-8979 OT Time Calculation (min):  34 min Charges:  OT General Charges $OT Visit: 1 Visit OT Evaluation $OT Eval Moderate Complexity: 1 Mod OT Treatments $Therapeutic Activity: 8-22 mins  Kreg Sink, OT/L   Acute OT Clinical Specialist Acute Rehabilitation Services Pager [phone] Office [phone]   [facility] 05/01/2024, 10:43 AM

## 2024-05-02 LAB — CALCIUM, IONIZED: Calcium, Ionized, Serum: 5.1 mg/dL (ref 4.5–5.6)

## 2024-05-06 ENCOUNTER — Ambulatory Visit: Payer: MEDICAID

## 2024-05-06 VITALS — BP 120/70 | HR 89 | Temp 97.4°F | Resp 18 | Ht 63.0 in | Wt 198.5 lb

## 2024-05-06 DIAGNOSIS — R42 Dizziness and giddiness: Secondary | ICD-10-CM

## 2024-05-06 DIAGNOSIS — O99012 Anemia complicating pregnancy, second trimester: Secondary | ICD-10-CM

## 2024-05-06 DIAGNOSIS — R55 Syncope and collapse: Secondary | ICD-10-CM

## 2024-05-06 NOTE — Progress Notes (Signed)
 Patient Name: Deborah Gibbs DOB: [birth year] Age: 34 year old female Date of Service: 05/06/2024 Visit Type: Hospital Follow-Up Gestational Age: 28 weeks  Subjective Deborah Gibbs is a 34 year old, pregnant female at [redacted] weeks gestation who presents for hospital follow-up following a syncopal episode. The patient reports preceding symptoms of lightheadedness, dizziness, and blurred vision, followed by a transient loss of consciousness. Hospital reports note she underwent MRI brain, cervical spine and MRV of head are unremarkable. TSH, cortisol are normal. Urinalysis was negative for UTI. 2D echo showed EF estimated at 55 to 60%, normal LV diastolic parameters.  She was found to have hypomagnesemia, which was repleted with IV magnesium  replacement. The patient reports a recent viral illness prior to the syncopal event but denies associated vomiting or diarrhea. She denies palpitations, chest pain, shortness of breath, or known structural heart disease. She denies vaginal bleeding, abdominal pain, or loss of fetal movement. She reports persistent blurred vision, diplopia, lightheadedness, and dizziness since discharge. She has a scheduled ophthalmology appointment this coming Monday (05/11/24) and OBGYN follow-up next Friday (05/08/24). She was noted to have a hemoglobin of 10.6 g/dL during hospitalization and admits to discontinuing her prescribed ferrous sulfate . She denies current nausea, headaches, muscle weakness, or syncope since discharge.  Review of Systems Constitutional: Denies fever, chills, unintended weight loss Eyes: Reports blurred vision and diplopia; denies eye pain ENT: Denies hearing changes, tinnitus, nasal congestion, sore throat Cardiovascular: Denies chest pain, palpitations, syncope since discharge Respiratory: Denies shortness of breath, cough, wheezing Gastrointestinal: Denies nausea, vomiting, diarrhea, abdominal pain Genitourinary: Denies dysuria, vaginal bleeding; reports  continued fetal movement Neurologic: Reports dizziness and lightheadedness; denies focal weakness, numbness, seizures Musculoskeletal: Denies muscle pain or joint pain  Objective Vital Signs: Blood Pressure: 120/70 mmHg Pulse: 89 bpm Temperature: 97.19F Respirations: 18 breaths/min Oxygen Saturation: 79% on room air Weight: 198.8 lbs Height: 5'3 BMI: 35.16 kg/m General: Alert, oriented 3, no acute distress Cardiovascular: Regular rate and rhythm; no murmurs, rubs, or gallops Respiratory: Clear to auscultation bilaterally; no respiratory distress Neurologic: Cranial nerves II-XII grossly intact; no focal deficits Abdomen: Non-tender   Assessment Syncope, unspecified (R55) Extensive inpatient workup negative Suspected multifactorial etiology including dehydration, anemia, hypomagnesemia, and physiologic changes of pregnancy Dizziness and lightheadedness (R42) Persistent but stable symptoms Visual disturbance (blurred vision and diplopia) (H53.8) Ophthalmology evaluation pending Iron deficiency anemia complicating pregnancy (O99.012) Hemoglobin 10.6 g/dL Nonadherence to iron supplementation Hypomagnesemia, resolved (E83.42) Treated during hospitalization Pregnancy at [redacted] weeks gestation (Z3A.18)  Plan Iron Deficiency Anemia: Restart ferrous sulfate  325 mg PO every other day Counselled on adherence and administration with vitamin C to improve absorption Hydration: Encourage increased oral fluid intake Visual Symptoms: Maintain scheduled ophthalmology appointment Pregnancy Care: Continue routine prenatal care Follow up with OBGYN as scheduled Return Precautions: Advised to seek urgent care for recurrent syncope, worsening dizziness, chest pain, shortness of breath, vaginal bleeding, or decreased fetal movement Follow-Up: Return to clinic in 3 months or sooner as clinically indicated  Jon Bihari, NP

## 2024-05-13 ENCOUNTER — Ambulatory Visit: Payer: MEDICAID

## 2024-05-13 VITALS — BP 122/70 | HR 83 | Temp 97.6°F | Resp 18 | Ht 63.0 in | Wt 202.2 lb

## 2024-05-13 DIAGNOSIS — Z111 Encounter for screening for respiratory tuberculosis: Secondary | ICD-10-CM

## 2024-05-13 DIAGNOSIS — M545 Low back pain, unspecified: Secondary | ICD-10-CM

## 2024-05-13 DIAGNOSIS — Z0000 Encounter for general adult medical examination without abnormal findings: Secondary | ICD-10-CM

## 2024-05-13 DIAGNOSIS — F32A Depression, unspecified: Secondary | ICD-10-CM | POA: Insufficient documentation

## 2024-05-13 DIAGNOSIS — D509 Iron deficiency anemia, unspecified: Secondary | ICD-10-CM | POA: Insufficient documentation

## 2024-05-13 DIAGNOSIS — F419 Anxiety disorder, unspecified: Secondary | ICD-10-CM | POA: Insufficient documentation

## 2024-05-13 DIAGNOSIS — Z3492 Encounter for supervision of normal pregnancy, unspecified, second trimester: Secondary | ICD-10-CM | POA: Insufficient documentation

## 2024-05-13 NOTE — Progress Notes (Signed)
 Patient Name: Tanina Barb DOB: 1990-06-21 Age: 34 years Date of Service: 05/13/2024 Visit Type: Routine Physical / Annual Exam Pregnancy Status: [redacted] weeks gestation Primary OB Provider: Established (next visit 05/15/2024)  S - Subjective Chief Complaint: Here for my routine physical.  History of Present Illness: Armoni Kludt is a 34 year old pregnant female (G3P2, [redacted] weeks gestation) presenting for a routine physical examination. She was recently hospitalized for a syncopal episode and reports she is doing much better overall. She continues to experience a mild headache since that episode. She reports participation in vestibular therapy with Epley maneuvers, which she states is going well. She continues to take ferrous sulfate  as prescribed. She reports no recurrent syncope, chest pain, palpitations, or shortness of breath. She had vision changes prior to the syncopal episode and has a scheduled ophthalmology appointment on 06/01/2024. She states she has not had a tetanus vaccination. She is up to date on COVID-19 and influenza vaccines. She is followed by OB/GYN with next appointment scheduled 05/15/2024. She reports a history of back surgery two years ago with chronic back pain, unchanged from baseline, and is followed by orthopedics. She is currently enrolled in school and has requested a TB skin test.  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 Denies tobacco use Occasional alcohol use prior to pregnancy; none currently Denies marijuana or illicit drug use Exercises by walking daily No specific diet followed  Review of Systems: Constitutional: Positive for fatigue and pregnancy-related weight gain HEENT: Positive for headache since syncopal episode; vision changes prior to syncope (ophthalmology follow-up scheduled) Respiratory: Denies cough, dyspnea, or shortness of breath Cardiovascular: Denies chest pain, palpitations, orthopnea, or edema Gastrointestinal: Denies nausea, vomiting, diarrhea, or constipation Genitourinary: Denies dysuria, urgency, or frequency Musculoskeletal: Positive for chronic back pain; denies new pain or stiffness Neurologic: Reports prior dizziness and syncope; symptoms have resolved Psychiatric: History of depression and anxiety; prior suicidal ideation one year ago with inpatient admission. Currently denies suicidal or homicidal ideation and reports doing well  O - Objective Vital Signs: Blood Pressure: 122/70 mmHg Pulse: 83 bpm Temperature: 97.27F Respirations: 18/min Oxygen Saturation: 97% on room air Weight: 202 lb 4 oz Height: 5'3 BMI: ~35.8 kg/m (pregnancy-adjusted interpretation)  Physical Examination: General: Well-appearing pregnant female in no acute distress. Alert and cooperative. HEENT: Normocephalic, atraumatic. Extraocular movements intact. Cardiovascular: Regular rate and rhythm. No murmurs, rubs, or gallops. Respiratory: Clear to auscultation bilaterally. No wheezes or crackles. Abdomen: Gravid abdomen consistent with gestational age. Non-tender. Musculoskeletal: Normal range of motion. Chronic low back discomfort reported; no acute findings. Neurologic: Alert and oriented 3. No focal deficits. Psychiatric: Mood and affect appropriate. Thought process logical. Denies SI/HI.  A - Assessment Routine adult physical  examination  ICD-10: Z00.00  Pregnancy, [redacted] weeks gestation  ICD-10: Z34.92  History of syncope -- resolved  ICD-10: R55  Headache, post-syncopal episode  ICD-10: R51.9  Iron deficiency anemia -- on treatment  ICD-10: D50.9  Chronic low back pain, status post surgery  ICD-10: M54.50  History of depression and anxiety -- stable  ICD-10: F41.9 /  F32.A  Screening for tuberculosis (school requirement)  ICD-10: Z11.1  P - Plan Routine Health Maintenance: Annual physical completed. No laboratory studies ordered due to recent hospitalization with complete labs reviewed.  Pregnancy Care: Continue routine prenatal care with OB/GYN. Keep scheduled OB appointment on 05/15/2024. Continue ferrous sulfate  as prescribed.  Syncope / Headache: Symptoms improved. Continue vestibular therapy/Epley maneuvers as directed. Follow up with ophthalmology on 06/01/2024. ER precautions reviewed for recurrent syncope, worsening headache, vision changes, chest pain, or neurologic symptoms.  Mental Health: Depression and anxiety currently stable. Denies SI/HI. Encouraged to notify provider or OB if symptoms recur. Discussed safety planning and available support resources. Chronic Back Pain: Continue follow-up with orthopedics. No acute changes.  Immunizations: Tdap discussed; advised Tdap during pregnancy (27-[redacted] weeks gestation) per guidelines. COVID and influenza vaccines up to date.  TB Skin Test: TB skin test ordered/performed today per school requirement. Patient instructed to return in 48-72 hours for reading.  Lifestyle Counseling: Encourage balanced nutrition during pregnancy. Continue daily walking as tolerated.  Follow-Up: Routine follow-up in one year for annual physical. Earlier follow-up as needed or per OB recommendations.  Jon Bihari, NP

## 2024-05-13 NOTE — Assessment & Plan Note (Signed)
 Depression and anxiety currently stable. Denies SI/HI. Encouraged to notify provider or OB if symptoms recur. Discussed safety planning and available support resources.

## 2024-05-13 NOTE — Assessment & Plan Note (Signed)
 Continue ferrous sulfate  as ordered by the OBGYN

## 2024-05-13 NOTE — Assessment & Plan Note (Signed)
 Continue follow-up with orthopedics. No acute changes.

## 2024-05-13 NOTE — Assessment & Plan Note (Signed)
 Continue routine prenatal care with OB/GYN. Keep scheduled OB appointment on 05/15/2024. Continue ferrous sulfate  as prescribed.
